# Patient Record
Sex: Male | Born: 1960
Health system: Southern US, Community
[De-identification: ages and names within clinical notes are randomized; demographics above are authoritative.]

## PROBLEM LIST (undated history)

## (undated) DIAGNOSIS — F419 Anxiety disorder, unspecified: Secondary | ICD-10-CM

## (undated) DIAGNOSIS — M1990 Unspecified osteoarthritis, unspecified site: Secondary | ICD-10-CM

## (undated) DIAGNOSIS — K219 Gastro-esophageal reflux disease without esophagitis: Secondary | ICD-10-CM

## (undated) DIAGNOSIS — G479 Sleep disorder, unspecified: Secondary | ICD-10-CM

## (undated) DIAGNOSIS — S46211A Strain of muscle, fascia and tendon of other parts of biceps, right arm, initial encounter: Secondary | ICD-10-CM

## (undated) DIAGNOSIS — I10 Essential (primary) hypertension: Secondary | ICD-10-CM

## (undated) DIAGNOSIS — R739 Hyperglycemia, unspecified: Secondary | ICD-10-CM

## (undated) DIAGNOSIS — T7840XA Allergy, unspecified, initial encounter: Secondary | ICD-10-CM

## (undated) DIAGNOSIS — E785 Hyperlipidemia, unspecified: Secondary | ICD-10-CM

## (undated) DIAGNOSIS — I639 Cerebral infarction, unspecified: Secondary | ICD-10-CM

## (undated) DIAGNOSIS — F32A Depression, unspecified: Secondary | ICD-10-CM

## (undated) DIAGNOSIS — G4700 Insomnia, unspecified: Secondary | ICD-10-CM

## (undated) DIAGNOSIS — F329 Major depressive disorder, single episode, unspecified: Secondary | ICD-10-CM

## (undated) HISTORY — DX: Insomnia, unspecified: G47.00

## (undated) HISTORY — DX: Hyperlipidemia, unspecified: E78.5

## (undated) HISTORY — DX: Essential (primary) hypertension: I10

## (undated) HISTORY — DX: Cerebral infarction, unspecified: I63.9

## (undated) HISTORY — PX: TOTAL HIP ARTHROPLASTY: SHX124

## (undated) HISTORY — DX: Allergy, unspecified, initial encounter: T78.40XA

## (undated) HISTORY — DX: Gastro-esophageal reflux disease without esophagitis: K21.9

## (undated) HISTORY — DX: Hyperglycemia, unspecified: R73.9

## (undated) HISTORY — PX: LUMBAR EPIDURAL INJECTION: SHX1980

## (undated) HISTORY — PX: JOINT REPLACEMENT: SHX530

---

## 1985-12-21 HISTORY — PX: OTHER SURGICAL HISTORY: SHX169

## 2001-05-20 ENCOUNTER — Encounter: Payer: Self-pay | Admitting: Family Medicine

## 2001-05-20 ENCOUNTER — Ambulatory Visit (HOSPITAL_COMMUNITY): Admission: RE | Admit: 2001-05-20 | Discharge: 2001-05-20 | Payer: Self-pay | Admitting: Family Medicine

## 2002-11-07 ENCOUNTER — Other Ambulatory Visit: Admission: RE | Admit: 2002-11-07 | Discharge: 2002-11-07 | Payer: Self-pay | Admitting: Family Medicine

## 2003-09-24 HISTORY — PX: COLONOSCOPY: SHX174

## 2004-12-21 HISTORY — PX: RECTAL SURGERY: SHX760

## 2007-04-22 ENCOUNTER — Ambulatory Visit (HOSPITAL_COMMUNITY): Admission: RE | Admit: 2007-04-22 | Discharge: 2007-04-22 | Payer: Self-pay | Admitting: Surgery

## 2011-01-20 ENCOUNTER — Encounter
Admission: RE | Admit: 2011-01-20 | Discharge: 2011-01-20 | Payer: Self-pay | Source: Home / Self Care | Attending: Physical Medicine and Rehabilitation | Admitting: Physical Medicine and Rehabilitation

## 2011-01-21 ENCOUNTER — Encounter: Payer: Self-pay | Admitting: Physical Medicine and Rehabilitation

## 2011-04-20 ENCOUNTER — Ambulatory Visit (HOSPITAL_COMMUNITY)
Admission: RE | Admit: 2011-04-20 | Discharge: 2011-04-20 | Disposition: A | Discharge status: Home / Self Care | Payer: 59 | Source: Ambulatory Visit | Attending: Orthopedic Surgery | Admitting: Orthopedic Surgery

## 2011-04-20 ENCOUNTER — Other Ambulatory Visit: Payer: Self-pay | Admitting: Orthopedic Surgery

## 2011-04-20 ENCOUNTER — Other Ambulatory Visit (HOSPITAL_COMMUNITY): Payer: Self-pay | Admitting: Orthopedic Surgery

## 2011-04-20 ENCOUNTER — Encounter (HOSPITAL_COMMUNITY): Payer: 59

## 2011-04-20 DIAGNOSIS — Z01810 Encounter for preprocedural cardiovascular examination: Secondary | ICD-10-CM | POA: Insufficient documentation

## 2011-04-20 DIAGNOSIS — I10 Essential (primary) hypertension: Secondary | ICD-10-CM | POA: Insufficient documentation

## 2011-04-20 DIAGNOSIS — Z01812 Encounter for preprocedural laboratory examination: Secondary | ICD-10-CM | POA: Insufficient documentation

## 2011-04-20 DIAGNOSIS — Z01818 Encounter for other preprocedural examination: Secondary | ICD-10-CM | POA: Insufficient documentation

## 2011-04-20 LAB — DIFFERENTIAL
Basophils Absolute: 0 10*3/uL (ref 0.0–0.1)
Eosinophils Relative: 1 % (ref 0–5)
Lymphocytes Relative: 21 % (ref 12–46)
Monocytes Absolute: 0.9 10*3/uL (ref 0.1–1.0)
Monocytes Relative: 12 % (ref 3–12)
Neutro Abs: 4.7 10*3/uL (ref 1.7–7.7)

## 2011-04-20 LAB — URINALYSIS, ROUTINE W REFLEX MICROSCOPIC
Bilirubin Urine: NEGATIVE
Glucose, UA: NEGATIVE mg/dL
Hgb urine dipstick: NEGATIVE
Ketones, ur: NEGATIVE mg/dL
Protein, ur: NEGATIVE mg/dL
Urobilinogen, UA: 0.2 mg/dL (ref 0.0–1.0)

## 2011-04-20 LAB — BASIC METABOLIC PANEL
CO2: 29 mEq/L (ref 19–32)
Calcium: 9.2 mg/dL (ref 8.4–10.5)
GFR calc Af Amer: >60 mL/min (ref >60)
Glucose, Bld: 115 mg/dL — ABNORMAL HIGH (ref 70–99)
Potassium: 4.6 mEq/L (ref 3.5–5.1)
Sodium: 141 mEq/L (ref 135–145)

## 2011-04-20 LAB — CBC
HCT: 43.5 % (ref 39.0–52.0)
Hemoglobin: 14.7 g/dL (ref 13.0–17.0)
MCH: 30.1 pg (ref 26.0–34.0)
MCHC: 33.8 g/dL (ref 30.0–36.0)
MCV: 89 fL (ref 78.0–100.0)
RDW: 12.2 % (ref 11.5–15.5)

## 2011-04-20 LAB — SURGICAL PCR SCREEN: MRSA, PCR: NEGATIVE

## 2011-04-20 LAB — PROTIME-INR: Prothrombin Time: 13.2 seconds (ref 11.6–15.2)

## 2011-04-20 NOTE — H&P (Signed)
Curtis Lucas, Curtis Lucas              ACCOUNT NO.:  [id number]  MEDICAL RECORD NO.:  [id number]           PATIENT TYPE:  I  LOCATION:  1S                           FACILITY:  [facility]  PHYSICIAN:  Madlyn Frankel. Charlann Boxer, M.D.  DATE OF BIRTH:  Jan 14, 1961  DATE OF ADMISSION:  04/13/2011 DATE OF DISCHARGE:                             HISTORY & PHYSICAL   CHIEF COMPLAINT:  Left hip osteoarthritis.  HISTORY OF PRESENT ILLNESS:  This is a 50 year old gentleman with a history of osteoarthritis of his left hip that has failed conservative treatment to alleviate his pain and discomfort.  After discussion of treatments, benefits, risks, and options, the patient is now scheduled for total hip arthroplasty with an anterior approach.  Please note the patient is not a candidate for tranexamic acid and will not receive that in the preoperative holding and he is given his home prescriptions including aspirin 325 mg 1 b.i.d. for DVT prophylaxis.PAST MEDICAL HISTORY:  DRUG ALLERGIES:  None.  CURRENT MEDICATIONS:  Atenolol, amlodipine, Protonix, and a statin, he will bring dosages to the hospital.  PREVIOUS SURGERIES:  Brain surgery for impalement of carotid artery and subsequent coiling of a aneurysm.  MEDICAL ILLNESSES: 1. Hyperlipidemia. 2. Hypertension. 3. Reflux. 4. History of stroke secondary to carotid artery injury.  FAMILY HISTORY:  Positive for colon cancer.  SOCIAL HISTORY:  The patient is married.  He works as a Corporate treasurer. He does not smoke and drinks a 6-pack a day.  REVIEW OF SYSTEMS:  CENTRAL NERVOUS SYSTEM:  Positive for history of stroke in 1987 secondary to carotid artery injury.  PULMONARY:  Negative for shortness of breath, PND, or orthopnea.  CARDIOVASCULAR:  Negative for chest pain and palpitation.  Positive for history of hypertension. GI:  Negative for ulcers and hepatitis.  Positive for reflux.  GU: Negative for urinary tract difficulties.  MUSCULOSKELETAL:  Positive  as in HPI.  PHYSICAL EXAMINATION:  VITAL SIGNS:  BP 130/80, respirations 18, pulse 72 and regular. GENERAL APPERANCE:  This is a well-developed, well-nourished gentleman in no acute distress. HEENT:  Head normocephalic.  Nose patent.  Ears patent.  Pupils equal, round, and reactive to light.  There is slight ptosis of the right eyelid. NECK:  Supple without adenopathy.  Carotids 2+ without bruit. CHEST:  Clear to auscultation.  No rales or rhonchi.  Respirations 18. HEART:  Regular rate and rhythm at 72 beats per minute without murmur. ABDOMEN:  Soft.  No active bowel sounds.  No masses or organomegaly. NEUROLOGIC:  The patient is alert and oriented to time, place, and person.  He does have a history of a CVA secondary to carotid artery injury. EXTREMITIES:  Left hip with decreased range of motion and painful range of motion.  Neurovascular status intact.  IMPRESSION:  Left hip osteoarthritis.  PLAN:  Left total hip arthroplasty by anterior approach.     Jaquelyn Bitter. Chabon, P.A.   ______________________________ Madlyn Frankel Charlann Boxer, M.D.    SJC/MEDQ  D:  04/15/2011  T:  04/16/2011  Job:  [id number]  Electronically Signed by Jodene Nam P.A. on 04/20/2011 09:36:06 AM Electronically Signed by  Durene Romans M.D. on 04/20/2011 12:14:57 PM

## 2011-04-28 ENCOUNTER — Inpatient Hospital Stay (HOSPITAL_COMMUNITY): Payer: 59

## 2011-04-28 ENCOUNTER — Inpatient Hospital Stay (HOSPITAL_COMMUNITY)
Admission: RE | Admit: 2011-04-28 | Discharge: 2011-04-30 | DRG: 470 | Disposition: A | Discharge status: Home Health | Payer: 59 | Source: Ambulatory Visit | Attending: Orthopedic Surgery | Admitting: Orthopedic Surgery

## 2011-04-28 DIAGNOSIS — M169 Osteoarthritis of hip, unspecified: Principal | ICD-10-CM | POA: Diagnosis present

## 2011-04-28 DIAGNOSIS — K219 Gastro-esophageal reflux disease without esophagitis: Secondary | ICD-10-CM | POA: Diagnosis present

## 2011-04-28 DIAGNOSIS — Z8673 Personal history of transient ischemic attack (TIA), and cerebral infarction without residual deficits: Secondary | ICD-10-CM

## 2011-04-28 DIAGNOSIS — I10 Essential (primary) hypertension: Secondary | ICD-10-CM | POA: Diagnosis present

## 2011-04-28 DIAGNOSIS — E785 Hyperlipidemia, unspecified: Secondary | ICD-10-CM | POA: Diagnosis present

## 2011-04-28 DIAGNOSIS — M1610 Unilateral primary osteoarthritis, unspecified hip: Principal | ICD-10-CM | POA: Diagnosis present

## 2011-04-28 LAB — ABO/RH: ABO/RH(D): A POS

## 2011-04-28 LAB — TYPE AND SCREEN
ABO/RH(D): A POS
Antibody Screen: NEGATIVE

## 2011-04-29 LAB — BASIC METABOLIC PANEL
BUN: 9 mg/dL (ref 6–23)
Calcium: 8.9 mg/dL (ref 8.4–10.5)
GFR calc non Af Amer: >60 mL/min (ref >60)
Glucose, Bld: 131 mg/dL — ABNORMAL HIGH (ref 70–99)
Sodium: 137 mEq/L (ref 135–145)

## 2011-04-29 LAB — CBC
HCT: 33.2 % — ABNORMAL LOW (ref 39.0–52.0)
MCHC: 33.7 g/dL (ref 30.0–36.0)
MCV: 89.5 fL (ref 78.0–100.0)
Platelets: 212 10*3/uL (ref 150–400)
RDW: 12.1 % (ref 11.5–15.5)

## 2011-04-30 ENCOUNTER — Emergency Department (HOSPITAL_COMMUNITY)
Admission: EM | Admit: 2011-04-30 | Discharge: 2011-04-30 | Disposition: A | Discharge status: Home / Self Care | Payer: 59 | Attending: Emergency Medicine | Admitting: Emergency Medicine

## 2011-04-30 DIAGNOSIS — Z96649 Presence of unspecified artificial hip joint: Secondary | ICD-10-CM | POA: Insufficient documentation

## 2011-04-30 DIAGNOSIS — E7800 Pure hypercholesterolemia, unspecified: Secondary | ICD-10-CM | POA: Insufficient documentation

## 2011-04-30 DIAGNOSIS — R509 Fever, unspecified: Secondary | ICD-10-CM | POA: Insufficient documentation

## 2011-04-30 DIAGNOSIS — I10 Essential (primary) hypertension: Secondary | ICD-10-CM | POA: Insufficient documentation

## 2011-04-30 LAB — BASIC METABOLIC PANEL
BUN: 11 mg/dL (ref 6–23)
Creatinine, Ser: 0.71 mg/dL (ref 0.4–1.5)
GFR calc Af Amer: >60 mL/min (ref >60)
Glucose, Bld: 103 mg/dL — ABNORMAL HIGH (ref 70–99)
Sodium: 139 mEq/L (ref 135–145)

## 2011-04-30 LAB — URINALYSIS, ROUTINE W REFLEX MICROSCOPIC
Bilirubin Urine: NEGATIVE
Glucose, UA: NEGATIVE mg/dL
Hgb urine dipstick: NEGATIVE
Ketones, ur: NEGATIVE mg/dL
Protein, ur: NEGATIVE mg/dL
pH: 7 (ref 5.0–8.0)

## 2011-04-30 LAB — CBC
MCHC: 33.1 g/dL (ref 30.0–36.0)
RDW: 12.3 % (ref 11.5–15.5)
WBC: 12.3 10*3/uL — ABNORMAL HIGH (ref 4.0–10.5)

## 2011-05-04 NOTE — Op Note (Signed)
Curtis Lucas, Curtis Lucas              ACCOUNT NO.:  [id number]  MEDICAL RECORD NO.:  [id number]           PATIENT TYPE:  I  LOCATION:  1529                         FACILITY:  [facility]  PHYSICIAN:  Madlyn Frankel. Charlann Boxer, M.D.  DATE OF BIRTH:  Jul 26, 1961  DATE OF PROCEDURE:  04/28/2011 DATE OF DISCHARGE:                              OPERATIVE REPORT   PREOPERATIVE DIAGNOSIS:  Left hip osteoarthritis.  POSTOPERATIVE DIAGNOSIS:  Left hip osteoarthritis.  PROCEDURE:  Left total hip replacement utilizing the anterior approach, utilizing DePuy component size 54 Pinnacle cup size 5 standard Tri-Lock stem with a 36 +5 Delta ceramic ball and AltrX neutral liner.  SURGEON:  Madlyn Frankel. Charlann Boxer, M.D.  ASSISTANT:  Jaquelyn Bitter. Chabon, P.A.  ANESTHESIA:  General.  BLOOD LOSS:  600 cc.  DRAINS:  One Hemovac.  COMPLICATIONS:  None.  SPECIMEN:  None.  INDICATIONS FOR THE PROCEDURE:  Mr. Sisney is a 50 year old gentleman who presented to the office for followup evaluation of his hip.  He has had progressive degenerative changes with concerns for an underlying diagnosis for mild hip dysplasia based on his young age for development arthritic changes.  After reviewing risks and benefits and options of approaches of infection, DVT, component failure, dislocation, as well as postop recovery and expectations, consent was obtained for left total hip replacement through anterior approach.  PROCEDURE IN DETAIL:  The patient was brought to operative theater. Once adequate anesthesia and preoperative antibiotics administered, the patient was positioned supine on the [employer] table.  His left hip was predraped and then fluoroscopy used to confirm positioning and landmarks.  The left hip was then prepped and draped in sterile fashion using shower- curtain technique.  Time-out was performed identifying the patient, planned procedure, and extremity.  His anterior superior iliac spine was identified and  incision was started 2 cm lateral and distal to this.  It was extended then over the anterior aspect of the trochanter.  Sharp dissection was carried down to the tensor fascia muscle belly.  This fascia was incised.  The muscle belly elevated off the undersurface of it and then swept laterally.  A retractor was placed along the superior neck.  At this point, cauterized circumflex vessels were removed.  Capsular fat exposed to the anterior aspect of the hip joint. An inferior retractor was placed.  At this point, an L capsulotomy was made along the superior neck to the trochanteric fossa, then extending to the lesser trochanteric region. The stay sutures were placed and retractors placed intracapsular.  At this point, I removed some anterior osteophytes and applied traction on the hip.  The neck osteotomy was delineated under fluoroscopic imaging.  At this point, neck osteotomy made and the femoral head was removed.  At this point, I placed the posterior retractor then placed an anterior retractor.  I was able to remove remaining foveal tissue, noticed significant medial osteophytes, as well as remaining labrum posterior and anterior.  I began reaming with a 48-reamer and reamed up to 53 reamer, the last 2 reamers were used under fluoroscopic imaging to confirm positioning, depth of reaming, as well as orientation.  I chose a 54 Pinnacle cup, this cup was then impacted with a curve impactor. After a couple of adjustments made under fluoroscopic imaging to confirm position in terms of abduction and forward flexion as well as confirm an anatomic boundaries within the hip itself, I then placed a single cancellous screw hole eliminator and the final 36 +4 neutral AltrX liner.  At this point, the hip was rotated to 80 degrees as removed more of the inferior capsule off the neck.  The hip was then rolled back to neutral and a hook applied to the lateral femur for elevation  purposes.  Traction was then placed medially, and the hip was taken down in.  Then after using posterior release exposing the posterior aspect of the femur, the retractors were placed over the greater trochanter.  Once I had the femur exposed, I used a box osteotome.  I then cleared up lateral aspect of the neck to try to prevent any varus position stem.  The starting broach was then impacted and then all retractors were removed and the hip brought back to neutral position and radiographs obtained in neutral and orthogonally 90 degrees confirming the position within the shaft.  Once this was done, all retractors were re-placed.  The leg was taken back down in and I began broaching.  I broached up to a size of 5 broach.  At this point, we had good medial and lateral metaphysis fit. A trial reduction was carried out 36 +4.5 ball.  With this ball in place, under fluoroscopic imaging, I felt there was good fit with the broach in place with a 1.5 ball and the leg seemed to be a little bit short on this left side.  This was kept in mind for final position.  Following the trial reduction, the trial component was removed.  The 5 standard stem was chosen.  The 5 standard stem was then impacted and sat basically where the broach was, and based on this, I went ahead and retrial with 36 +5 ball.  With this, I felt that the lesser trochanters were symmetric through the ischium in relation to one another.  The trial was removed and the final 36 +5 Delta ceramic ball was impacted on the clean and dry trunnion and the hip re-reduced.  The hip had been irrigated throughout the case.  I then reapproximated the anterior capsule to each other using #1 Vicryl.  The remainder wound was closed over Hemovac drain using #1 Vicryl in tensor fascia lata muscle.  The remainder wound was closed with 2-0 Vicryl running 4-0 Monocryl.  The hip was cleaned, dried, and dressed sterilely with some Dermabond and  Aquacel dressing.  The drain site dressed separately.  He was then brought to recovery room in extubated stable condition, tolerating procedure well.     Madlyn Frankel Charlann Boxer, M.D.     MDO/MEDQ  D:  04/28/2011  T:  04/28/2011  Job:  [id number]  Electronically Signed by Durene Romans M.D. on 05/04/2011 03:42:09 PM

## 2011-05-04 NOTE — Discharge Summary (Signed)
  Curtis Lucas, Curtis Lucas              ACCOUNT NO.:  [id number]  MEDICAL RECORD NO.:  [id number]           PATIENT TYPE:  I  LOCATION:  1529                         FACILITY:  [facility]  PHYSICIAN:  Madlyn Frankel. Charlann Boxer, M.D.  DATE OF BIRTH:  January 02, 1961  DATE OF ADMISSION:  04/28/2011 DATE OF DISCHARGE:                              DISCHARGE SUMMARY   ADMITTING DIAGNOSIS:  Left hip osteoarthritis.  DISCHARGE DIAGNOSIS:  Left hip osteoarthritis.  OPERATIONS:  Total hip arthroplasty, anterior approach, left hip.  BRIEF HISTORY:  This is a 50 year old gentleman with a history of osteoarthritis of the left hip that failed conservative management.  At this time, he is admitted to the hospital for anterior total hip arthroplasty of his left hip.  Surgery risks, benefits, and aftercare were discussed with the patient.  Questions invited and answered.  LABORATORY VALUES:  Admission CBC within normal limits.  Admission PT/PTT within normal limits.  Admission BMET showed the glucose slightly high at 115, hemoglobin/hematocrit at discharge were 11.2 and 33.8, white count 12.3 which was down from 13.4.  BMET remained within normal limits with the exception of mildly elevated glucose.  COURSE IN THE HOSPITAL:  The patient tolerated the procedure well.  On first postoperative day, vital signs were stable, he was afebrile. Neurovascular status intact, only moderate drainage from his Hemovac and his Hemovac was discontinued without difficulties.  Calves were negative.  Dressing was dry.  Neurovascular status intact.  After removing the drain, he did have some moderate serosanguineous drainage and some more discomfort than he was having previous to drain removal. Over that night, the pain did decrease and on second postoperative day, his vital signs were stable.  He is afebrile.  Neurovascular status intact.  Calves negative.  His diet was soft showing no evidence of hematoma or compartment syndrome.   Dressing was changed.  Wound was benign.  Calves were negative.  Lungs clear.  Heart sounds normal, and the patient will undergo physical therapy this morning.  If he does well, he will be discharged this afternoon.  Otherwise, we will discharge him in the morning.  CONDITION ON DISCHARGE:  Improved.  DISCHARGE MEDICATIONS: 1. OxyIR 5 mg 1-2 q.4 h. p.r.n. pain. 2. Robaxin 500 p.o. q.6-8 h. p.r.n. spasm. 3. Iron 325 mg p.o. b.i.d. 4. Aspirin 325 mg 1 p.o. b.i.d. for 4 weeks for DVT prophylaxis. 5. MiraLax and Colace p.r.n. constipation.  DISCHARGE INSTRUCTIONS:  He is instructed to keep the wound clean and dry for 4 weeks.  He can weightbear as tolerated using his walker.  He will return to the office in 2 weeks for recheck or see [place] sooner p.r.n. problems.     Jaquelyn Bitter. Chabon, P.A.   ______________________________ Madlyn Frankel Charlann Boxer, M.D.    SJC/MEDQ  D:  04/30/2011  T:  04/30/2011  Job:  [id number]  Electronically Signed by Jodene Nam P.A. on 05/04/2011 11:56:08 AM Electronically Signed by Durene Romans M.D. on 05/04/2011 03:42:18 PM

## 2011-06-29 ENCOUNTER — Encounter: Payer: Self-pay | Admitting: Family Medicine

## 2011-06-29 DIAGNOSIS — R739 Hyperglycemia, unspecified: Secondary | ICD-10-CM | POA: Insufficient documentation

## 2011-06-29 DIAGNOSIS — K219 Gastro-esophageal reflux disease without esophagitis: Secondary | ICD-10-CM | POA: Insufficient documentation

## 2011-06-29 DIAGNOSIS — E785 Hyperlipidemia, unspecified: Secondary | ICD-10-CM | POA: Insufficient documentation

## 2011-06-29 DIAGNOSIS — I10 Essential (primary) hypertension: Secondary | ICD-10-CM | POA: Insufficient documentation

## 2011-06-29 DIAGNOSIS — T7840XA Allergy, unspecified, initial encounter: Secondary | ICD-10-CM | POA: Insufficient documentation

## 2011-06-29 DIAGNOSIS — I639 Cerebral infarction, unspecified: Secondary | ICD-10-CM | POA: Insufficient documentation

## 2012-04-13 NOTE — H&P (Signed)
Curtis Lucas is an 51 y.o. male.    Chief Complaint: right hip OA and pain   HPI: Pt is a 51 y.o. male complaining of right hip pain for 6 months. He originally had a left total hip arthroplasty from an anterior approach about 1 year ago. Right hip pain had continually increased since the beginning and now has started to affect his right and left knees as well as causing some pain in the left hip.  Prior to the right hip pain she wasn't having any other joint pain.   X-rays in the clinic show end-stage arthritic changes of the right hip and a well placed previous left total hip arthroplasty. Pt has tried various conservative treatments which have failed to alleviate their symptoms. Various options are discussed with the patient. Risks, benefits and expectations were discussed with the patient. Patient understand the risks, benefits and expectations and wishes to proceed with surgery.   PCP:  Leo Grosser, MD, MD  D/C Plans:  Home with HHPT  Post-op Meds:  Rx given for ASA, Robaxin, Iron, Colace and MiraLax  Tranexamic Acid:   Not to be given - previous traumatic stroke  Decadron:   To be given  PMH: Past Medical History  Diagnosis Date  . Hypertension   . GERD (gastroesophageal reflux disease)   . CVA (cerebral vascular accident)   . Allergy     Rhinitis  . Elevated lipids   . Hyperglycemia     PSH: Past Surgical History  Procedure Date  . Total hip arthroplasty   . Lumbar epidural injection     Social History:  does not have a smoking history on file. He does not have any smokeless tobacco history on file. His alcohol and drug histories not on file.  Allergies:  Allergies  Allergen Reactions  . Lipitor (Atorvastatin Calcium)     Severe muscle spasms    Medications: No current facility-administered medications for this encounter.   Current Outpatient Prescriptions  Medication Sig Dispense Refill  . amLODipine (NORVASC) 10 MG tablet Take 10 mg by mouth daily.         [name] atenolol (TENORMIN) 100 MG tablet Take 100 mg by mouth daily.        . pantoprazole (PROTONIX) 40 MG tablet Take 40 mg by mouth daily.        . pravastatin (PRAVACHOL) 40 MG tablet Take 40 mg by mouth daily.        [name] zolpidem (AMBIEN CR) 12.5 MG CR tablet Take 12.5 mg by mouth at bedtime as needed.          ROS: Review of Systems  Constitutional: Negative.   HENT: Negative.   Eyes: Positive for double vision.  Respiratory: Negative.   Cardiovascular: Negative.   Gastrointestinal: Negative.   Genitourinary: Negative.   Musculoskeletal: Positive for joint pain.  Skin: Negative.   Neurological: Negative.   Endo/Heme/Allergies: Negative.   Psychiatric/Behavioral: Negative.      Physical Exam: BP: 135/82 ; HR: 80 ; Resp: 16 ; Physical Exam  Constitutional: He is oriented to person, place, and time and well-developed, well-nourished, and in no distress.  HENT:  Head: Normocephalic and atraumatic.  Nose: Nose normal.  Mouth/Throat: Oropharynx is clear and moist.  Eyes: Pupils are equal, round, and reactive to light.  Neck: Neck supple. No JVD present. No tracheal deviation present. No thyromegaly present.  Cardiovascular: Normal rate, regular rhythm, normal heart sounds and intact distal pulses.   Pulmonary/Chest: Effort normal and  breath sounds normal. No stridor. No respiratory distress. He has no wheezes. He exhibits no tenderness.  Abdominal: Soft. There is no tenderness. There is no guarding.  Musculoskeletal:       Right hip: He exhibits decreased range of motion, tenderness, bony tenderness and crepitus. He exhibits normal strength, no swelling, no deformity and no laceration.  Lymphadenopathy:    He has no cervical adenopathy.  Neurological: He is alert and oriented to person, place, and time.  Skin: Skin is warm and dry.  Psychiatric: Affect normal.     Assessment/Plan Assessment: right hip OA and pain   Plan: Patient will undergo a right total hip  arthroplasty, anterior approach on 05/03/2012 per Dr. Charlann Boxer at [facility]. Risks benefits and expectation were discussed with the patient. Patient understand risks, benefits and expectation and wishes to proceed.   Curtis Lucas   PAC  04/13/2012, 4:40 PM

## 2012-04-20 ENCOUNTER — Encounter (HOSPITAL_COMMUNITY): Payer: Self-pay | Admitting: Pharmacy Technician

## 2012-04-26 ENCOUNTER — Encounter (HOSPITAL_COMMUNITY): Payer: Self-pay

## 2012-04-26 ENCOUNTER — Encounter (HOSPITAL_COMMUNITY)
Admission: RE | Admit: 2012-04-26 | Discharge: 2012-04-26 | Disposition: A | Discharge status: Home / Self Care | Payer: 59 | Source: Ambulatory Visit | Attending: Orthopedic Surgery | Admitting: Orthopedic Surgery

## 2012-04-26 ENCOUNTER — Ambulatory Visit (HOSPITAL_COMMUNITY)
Admission: RE | Admit: 2012-04-26 | Discharge: 2012-04-26 | Disposition: A | Discharge status: Home / Self Care | Payer: 59 | Source: Ambulatory Visit | Attending: Orthopedic Surgery | Admitting: Orthopedic Surgery

## 2012-04-26 DIAGNOSIS — Z01812 Encounter for preprocedural laboratory examination: Secondary | ICD-10-CM | POA: Insufficient documentation

## 2012-04-26 DIAGNOSIS — M169 Osteoarthritis of hip, unspecified: Secondary | ICD-10-CM | POA: Insufficient documentation

## 2012-04-26 DIAGNOSIS — Z01818 Encounter for other preprocedural examination: Secondary | ICD-10-CM | POA: Insufficient documentation

## 2012-04-26 DIAGNOSIS — I10 Essential (primary) hypertension: Secondary | ICD-10-CM | POA: Insufficient documentation

## 2012-04-26 DIAGNOSIS — M1610 Unilateral primary osteoarthritis, unspecified hip: Secondary | ICD-10-CM | POA: Insufficient documentation

## 2012-04-26 HISTORY — DX: Unspecified osteoarthritis, unspecified site: M19.90

## 2012-04-26 HISTORY — DX: Sleep disorder, unspecified: G47.9

## 2012-04-26 LAB — DIFFERENTIAL
Basophils Absolute: 0 10*3/uL (ref 0.0–0.1)
Eosinophils Relative: 1 % (ref 0–5)
Lymphocytes Relative: 23 % (ref 12–46)
Monocytes Absolute: 0.7 10*3/uL (ref 0.1–1.0)
Monocytes Relative: 11 % (ref 3–12)
Neutro Abs: 4.4 10*3/uL (ref 1.7–7.7)

## 2012-04-26 LAB — CBC
HCT: 46.5 % (ref 39.0–52.0)
Hemoglobin: 16.2 g/dL (ref 13.0–17.0)
MCV: 90.5 fL (ref 78.0–100.0)
RDW: 12.3 % (ref 11.5–15.5)
WBC: 6.7 10*3/uL (ref 4.0–10.5)

## 2012-04-26 LAB — URINALYSIS, ROUTINE W REFLEX MICROSCOPIC
Glucose, UA: NEGATIVE mg/dL
Hgb urine dipstick: NEGATIVE
Leukocytes, UA: NEGATIVE
Specific Gravity, Urine: 1.019 (ref 1.005–1.030)
pH: 6.5 (ref 5.0–8.0)

## 2012-04-26 LAB — BASIC METABOLIC PANEL
BUN: 14 mg/dL (ref 6–23)
CO2: 27 mEq/L (ref 19–32)
Chloride: 102 mEq/L (ref 96–112)
Creatinine, Ser: 0.83 mg/dL (ref 0.50–1.35)
Potassium: 4.5 mEq/L (ref 3.5–5.1)

## 2012-04-26 LAB — APTT: aPTT: 31 seconds (ref 24–37)

## 2012-04-26 MED ORDER — CHLORHEXIDINE GLUCONATE 4 % EX LIQD
60.0000 mL | Freq: Once | CUTANEOUS | Status: DC
  Filled 2012-04-26: qty 60

## 2012-04-26 NOTE — Patient Instructions (Signed)
20 ROTH RESS  04/26/2012   Your procedure is scheduled on:  05/03/12  Report to SHORT STAY DEPT  at 6:15 AM.  Call this number if you have problems the morning of surgery: [phone]   Remember:   Do not eat food or drink liquids AFTER MIDNIGHT    Take these medicines the morning of surgery with A SIP OF WATER: AMLODIPINE / ATENOLOL / PANTOPROZOLE / OXYCODONE IF NEEED   Do not wear jewelry, make-up or nail polish.  Do not wear lotions, powders, or perfumes.   Do not shave legs or underarms 48 hrs. before surgery (men may shave face)  Do not bring valuables to the hospital.  Contacts, dentures or bridgework may not be worn into surgery.  Leave suitcase in the car. After surgery it may be brought to your room.  For patients admitted to the hospital, checkout time is 11:00 AM the day of discharge.   Patients discharged the day of surgery will not be allowed to drive home.    Special Instructions:   Please read over the following fact sheets that you were given: MRSA  Information               SHOWER WITH BETASEPT THE NIGHT BEFORE SURGERY AND THE MORNING OF SURGERY 20 DONIS PINDER  04/26/2012

## 2012-05-02 NOTE — Pre-Procedure Instructions (Signed)
Left pt message surgery time changed to 0715am, arrive 0515am 04-03-12 [facility] short stay

## 2012-05-02 NOTE — Pre-Procedure Instructions (Signed)
Pt returned call aware surgey time changed to 715 am aware, will arrive 0515 am [facility] short stay

## 2012-05-03 ENCOUNTER — Ambulatory Visit (HOSPITAL_COMMUNITY): Payer: 59

## 2012-05-03 ENCOUNTER — Encounter (HOSPITAL_COMMUNITY)
Admission: RE | Disposition: A | Discharge status: Home Health | Payer: Self-pay | Source: Ambulatory Visit | Attending: Orthopedic Surgery

## 2012-05-03 ENCOUNTER — Encounter (HOSPITAL_COMMUNITY): Payer: Self-pay | Admitting: *Deleted

## 2012-05-03 ENCOUNTER — Encounter (HOSPITAL_COMMUNITY): Payer: Self-pay | Admitting: Orthopedic Surgery

## 2012-05-03 ENCOUNTER — Ambulatory Visit (HOSPITAL_COMMUNITY): Payer: 59 | Admitting: *Deleted

## 2012-05-03 ENCOUNTER — Inpatient Hospital Stay (HOSPITAL_COMMUNITY)
Admission: RE | Admit: 2012-05-03 | Discharge: 2012-05-04 | DRG: 470 | Disposition: A | Discharge status: Home Health | Payer: 59 | Source: Ambulatory Visit | Attending: Orthopedic Surgery | Admitting: Orthopedic Surgery

## 2012-05-03 DIAGNOSIS — M169 Osteoarthritis of hip, unspecified: Principal | ICD-10-CM | POA: Diagnosis present

## 2012-05-03 DIAGNOSIS — E785 Hyperlipidemia, unspecified: Secondary | ICD-10-CM | POA: Diagnosis present

## 2012-05-03 DIAGNOSIS — I10 Essential (primary) hypertension: Secondary | ICD-10-CM | POA: Diagnosis present

## 2012-05-03 DIAGNOSIS — R7309 Other abnormal glucose: Secondary | ICD-10-CM | POA: Diagnosis present

## 2012-05-03 DIAGNOSIS — Z96649 Presence of unspecified artificial hip joint: Secondary | ICD-10-CM

## 2012-05-03 DIAGNOSIS — K219 Gastro-esophageal reflux disease without esophagitis: Secondary | ICD-10-CM | POA: Diagnosis present

## 2012-05-03 DIAGNOSIS — Z8673 Personal history of transient ischemic attack (TIA), and cerebral infarction without residual deficits: Secondary | ICD-10-CM

## 2012-05-03 DIAGNOSIS — M1610 Unilateral primary osteoarthritis, unspecified hip: Principal | ICD-10-CM | POA: Diagnosis present

## 2012-05-03 HISTORY — PX: TOTAL HIP ARTHROPLASTY: SHX124

## 2012-05-03 LAB — TYPE AND SCREEN
ABO/RH(D): A POS
Antibody Screen: NEGATIVE

## 2012-05-03 SURGERY — ARTHROPLASTY, HIP, TOTAL, ANTERIOR APPROACH
Anesthesia: General | Site: Hip | Laterality: Right | Wound class: Clean

## 2012-05-03 MED ORDER — MENTHOL 3 MG MT LOZG
1.0000 | LOZENGE | OROMUCOSAL | Status: DC | PRN
  Filled 2012-05-03: qty 9

## 2012-05-03 MED ORDER — DIPHENHYDRAMINE HCL 12.5 MG/5ML PO ELIX: 25.0000 mg | ORAL_SOLUTION | Freq: Four times a day (QID) | ORAL | Status: DC | PRN

## 2012-05-03 MED ORDER — SENNA 8.6 MG PO TABS
1.0000 | ORAL_TABLET | Freq: Two times a day (BID) | ORAL | Status: DC
  Administered 2012-05-03 – 2012-05-04 (×3): 8.6 mg via ORAL
  Filled 2012-05-03 (×3): qty 1

## 2012-05-03 MED ORDER — SODIUM CHLORIDE 0.9 % IV SOLN
INTRAVENOUS | Status: DC
  Administered 2012-05-03 (×2): via INTRAVENOUS
  Filled 2012-05-03 (×4): qty 1000

## 2012-05-03 MED ORDER — DEXAMETHASONE SODIUM PHOSPHATE 10 MG/ML IJ SOLN: 10.0000 mg | Freq: Once | INTRAMUSCULAR | Status: DC

## 2012-05-03 MED ORDER — ACETAMINOPHEN 10 MG/ML IV SOLN
INTRAVENOUS | Status: AC
  Filled 2012-05-03: qty 100

## 2012-05-03 MED ORDER — LACTATED RINGERS IV SOLN: INTRAVENOUS | Status: DC

## 2012-05-03 MED ORDER — MIDAZOLAM HCL 5 MG/5ML IJ SOLN
INTRAMUSCULAR | Status: DC | PRN
  Administered 2012-05-03: 2 mg via INTRAVENOUS

## 2012-05-03 MED ORDER — POLYETHYLENE GLYCOL 3350 17 G PO PACK: 17.0000 g | PACK | Freq: Every day | ORAL | Status: DC | PRN

## 2012-05-03 MED ORDER — ONDANSETRON HCL 4 MG PO TABS: 4.0000 mg | ORAL_TABLET | Freq: Four times a day (QID) | ORAL | Status: DC | PRN

## 2012-05-03 MED ORDER — CEFAZOLIN SODIUM-DEXTROSE 2-3 GM-% IV SOLR
2.0000 g | Freq: Once | INTRAVENOUS | Status: AC
  Administered 2012-05-03: 2 g via INTRAVENOUS

## 2012-05-03 MED ORDER — FENTANYL CITRATE 0.05 MG/ML IJ SOLN
25.0000 ug | INTRAMUSCULAR | Status: AC | PRN
  Administered 2012-05-03 (×6): 25 ug via INTRAVENOUS

## 2012-05-03 MED ORDER — ZOLPIDEM TARTRATE 5 MG PO TABS
5.0000 mg | ORAL_TABLET | Freq: Every evening | ORAL | Status: DC | PRN
  Administered 2012-05-03: 5 mg via ORAL
  Filled 2012-05-03: qty 1

## 2012-05-03 MED ORDER — RIVAROXABAN 10 MG PO TABS
10.0000 mg | ORAL_TABLET | Freq: Every day | ORAL | Status: DC
  Administered 2012-05-04: 10 mg via ORAL
  Filled 2012-05-03 (×2): qty 1

## 2012-05-03 MED ORDER — FENTANYL CITRATE 0.05 MG/ML IJ SOLN
INTRAMUSCULAR | Status: DC | PRN
  Administered 2012-05-03: 100 ug via INTRAVENOUS
  Administered 2012-05-03 (×3): 50 ug via INTRAVENOUS
  Administered 2012-05-03: 100 ug via INTRAVENOUS

## 2012-05-03 MED ORDER — ONDANSETRON HCL 4 MG/2ML IJ SOLN
INTRAMUSCULAR | Status: DC | PRN
  Administered 2012-05-03: 4 mg via INTRAVENOUS

## 2012-05-03 MED ORDER — DOCUSATE SODIUM 100 MG PO CAPS
100.0000 mg | ORAL_CAPSULE | Freq: Two times a day (BID) | ORAL | Status: DC
  Administered 2012-05-03 – 2012-05-04 (×3): 100 mg via ORAL

## 2012-05-03 MED ORDER — CEFAZOLIN SODIUM-DEXTROSE 2-3 GM-% IV SOLR
INTRAVENOUS | Status: AC
  Filled 2012-05-03: qty 50

## 2012-05-03 MED ORDER — PROPOFOL 10 MG/ML IV EMUL
INTRAVENOUS | Status: DC | PRN
  Administered 2012-05-03: 200 mg via INTRAVENOUS

## 2012-05-03 MED ORDER — HYDROMORPHONE HCL PF 1 MG/ML IJ SOLN
0.2000 mg | INTRAMUSCULAR | Status: DC | PRN
  Administered 2012-05-03 (×2): 0.5 mg via INTRAVENOUS
  Filled 2012-05-03 (×3): qty 1

## 2012-05-03 MED ORDER — AMLODIPINE BESYLATE 10 MG PO TABS
10.0000 mg | ORAL_TABLET | Freq: Every day | ORAL | Status: DC
  Administered 2012-05-04: 10 mg via ORAL
  Filled 2012-05-03: qty 1

## 2012-05-03 MED ORDER — LACTATED RINGERS IV SOLN
INTRAVENOUS | Status: DC | PRN
  Administered 2012-05-03 (×2): via INTRAVENOUS

## 2012-05-03 MED ORDER — NEOSTIGMINE METHYLSULFATE 1 MG/ML IJ SOLN
INTRAMUSCULAR | Status: DC | PRN
  Administered 2012-05-03: 5 mg via INTRAVENOUS

## 2012-05-03 MED ORDER — FENTANYL CITRATE 0.05 MG/ML IJ SOLN
INTRAMUSCULAR | Status: AC
  Filled 2012-05-03: qty 2

## 2012-05-03 MED ORDER — ONDANSETRON HCL 4 MG/2ML IJ SOLN: 4.0000 mg | Freq: Four times a day (QID) | INTRAMUSCULAR | Status: DC | PRN

## 2012-05-03 MED ORDER — PROMETHAZINE HCL 25 MG/ML IJ SOLN: 6.2500 mg | INTRAMUSCULAR | Status: DC | PRN

## 2012-05-03 MED ORDER — PHENOL 1.4 % MT LIQD
1.0000 | OROMUCOSAL | Status: DC | PRN
  Filled 2012-05-03: qty 177

## 2012-05-03 MED ORDER — OXYCODONE HCL 5 MG PO TABS
5.0000 mg | ORAL_TABLET | ORAL | Status: DC | PRN
  Administered 2012-05-03 – 2012-05-04 (×4): 10 mg via ORAL
  Filled 2012-05-03 (×4): qty 2

## 2012-05-03 MED ORDER — ATENOLOL 100 MG PO TABS
100.0000 mg | ORAL_TABLET | Freq: Every day | ORAL | Status: DC
  Administered 2012-05-04: 100 mg via ORAL
  Filled 2012-05-03: qty 1

## 2012-05-03 MED ORDER — PRAVASTATIN SODIUM 40 MG PO TABS
80.0000 mg | ORAL_TABLET | Freq: Every day | ORAL | Status: DC
  Administered 2012-05-03: 80 mg via ORAL
  Filled 2012-05-03 (×2): qty 2

## 2012-05-03 MED ORDER — DEXAMETHASONE SODIUM PHOSPHATE 10 MG/ML IJ SOLN
INTRAMUSCULAR | Status: DC | PRN
  Administered 2012-05-03: 10 mg via INTRAVENOUS

## 2012-05-03 MED ORDER — ATENOLOL 100 MG PO TABS
100.0000 mg | ORAL_TABLET | Freq: Every day | ORAL | Status: DC
  Filled 2012-05-03: qty 1

## 2012-05-03 MED ORDER — HYDROMORPHONE HCL PF 1 MG/ML IJ SOLN
INTRAMUSCULAR | Status: DC | PRN
  Administered 2012-05-03 (×2): 1 mg via INTRAVENOUS

## 2012-05-03 MED ORDER — ACETAMINOPHEN 10 MG/ML IV SOLN
INTRAVENOUS | Status: DC | PRN
  Administered 2012-05-03: 1000 mg via INTRAVENOUS

## 2012-05-03 MED ORDER — LIDOCAINE HCL (CARDIAC) 20 MG/ML IV SOLN
INTRAVENOUS | Status: DC | PRN
  Administered 2012-05-03: 100 mg via INTRAVENOUS

## 2012-05-03 MED ORDER — 0.9 % SODIUM CHLORIDE (POUR BTL) OPTIME
TOPICAL | Status: DC | PRN
  Administered 2012-05-03: 1000 mL

## 2012-05-03 MED ORDER — PANTOPRAZOLE SODIUM 40 MG PO TBEC
40.0000 mg | DELAYED_RELEASE_TABLET | Freq: Every day | ORAL | Status: DC
  Filled 2012-05-03: qty 1

## 2012-05-03 MED ORDER — PANTOPRAZOLE SODIUM 40 MG PO TBEC
40.0000 mg | DELAYED_RELEASE_TABLET | Freq: Every day | ORAL | Status: DC
  Administered 2012-05-04: 40 mg via ORAL
  Filled 2012-05-03: qty 1

## 2012-05-03 MED ORDER — AMLODIPINE BESYLATE 10 MG PO TABS
10.0000 mg | ORAL_TABLET | Freq: Every day | ORAL | Status: DC
Start: 2012-05-03 — End: 2012-05-03
  Filled 2012-05-03: qty 1

## 2012-05-03 MED ORDER — ALUM & MAG HYDROXIDE-SIMETH 200-200-20 MG/5ML PO SUSP: 30.0000 mL | ORAL | Status: DC | PRN

## 2012-05-03 MED ORDER — CEFAZOLIN SODIUM 1-5 GM-% IV SOLN
1.0000 g | Freq: Four times a day (QID) | INTRAVENOUS | Status: AC
  Administered 2012-05-03 – 2012-05-04 (×3): 1 g via INTRAVENOUS
  Filled 2012-05-03 (×3): qty 50

## 2012-05-03 MED ORDER — NON FORMULARY: 80.0000 mg | Freq: Every day | Status: DC

## 2012-05-03 MED ORDER — FERROUS SULFATE 325 (65 FE) MG PO TABS
325.0000 mg | ORAL_TABLET | Freq: Three times a day (TID) | ORAL | Status: DC
  Administered 2012-05-03 – 2012-05-04 (×3): 325 mg via ORAL
  Filled 2012-05-03 (×6): qty 1

## 2012-05-03 MED ORDER — GLYCOPYRROLATE 0.2 MG/ML IJ SOLN
INTRAMUSCULAR | Status: DC | PRN
  Administered 2012-05-03: .8 mg via INTRAVENOUS

## 2012-05-03 MED ORDER — METHOCARBAMOL 500 MG PO TABS
500.0000 mg | ORAL_TABLET | Freq: Four times a day (QID) | ORAL | Status: DC | PRN
  Administered 2012-05-03 – 2012-05-04 (×3): 500 mg via ORAL
  Filled 2012-05-03 (×3): qty 1

## 2012-05-03 MED ORDER — HETASTARCH-ELECTROLYTES 6 % IV SOLN
INTRAVENOUS | Status: DC | PRN
  Administered 2012-05-03: 08:00:00 via INTRAVENOUS

## 2012-05-03 MED ORDER — ROCURONIUM BROMIDE 100 MG/10ML IV SOLN
INTRAVENOUS | Status: DC | PRN
  Administered 2012-05-03: 50 mg via INTRAVENOUS
  Administered 2012-05-03: 20 mg via INTRAVENOUS

## 2012-05-03 SURGICAL SUPPLY — 38 items
BAG ZIPLOCK 12X15 (MISCELLANEOUS) ×4 IMPLANT
BLADE SAW SGTL 18X1.27X75 (BLADE) ×2 IMPLANT
CELLS DAT CNTRL 66122 CELL SVR (MISCELLANEOUS) ×1 IMPLANT
CLOTH BEACON ORANGE TIMEOUT ST (SAFETY) ×2 IMPLANT
DERMABOND ADVANCED (GAUZE/BANDAGES/DRESSINGS) ×1
DERMABOND ADVANCED .7 DNX12 (GAUZE/BANDAGES/DRESSINGS) ×1 IMPLANT
DRAPE C-ARM 42X72 X-RAY (DRAPES) ×2 IMPLANT
DRAPE STERI IOBAN 125X83 (DRAPES) ×2 IMPLANT
DRAPE U-SHAPE 47X51 STRL (DRAPES) ×6 IMPLANT
DRSG AQUACEL AG ADV 3.5X10 (GAUZE/BANDAGES/DRESSINGS) ×2 IMPLANT
DRSG TEGADERM 4X4.75 (GAUZE/BANDAGES/DRESSINGS) IMPLANT
DURAPREP 26ML APPLICATOR (WOUND CARE) ×2 IMPLANT
ELECT BLADE TIP CTD 4 INCH (ELECTRODE) ×2 IMPLANT
ELECT REM PT RETURN 9FT ADLT (ELECTROSURGICAL) ×2
ELECTRODE REM PT RTRN 9FT ADLT (ELECTROSURGICAL) ×1 IMPLANT
EVACUATOR 1/8 PVC DRAIN (DRAIN) IMPLANT
FACESHIELD LNG OPTICON STERILE (SAFETY) ×8 IMPLANT
GAUZE SPONGE 2X2 8PLY STRL LF (GAUZE/BANDAGES/DRESSINGS) ×1 IMPLANT
GLOVE BIOGEL PI IND STRL 7.5 (GLOVE) ×1 IMPLANT
GLOVE BIOGEL PI IND STRL 8 (GLOVE) ×1 IMPLANT
GLOVE BIOGEL PI INDICATOR 7.5 (GLOVE) ×1
GLOVE BIOGEL PI INDICATOR 8 (GLOVE) ×1
GLOVE ECLIPSE 8.0 STRL XLNG CF (GLOVE) ×2 IMPLANT
GLOVE ORTHO TXT STRL SZ7.5 (GLOVE) ×4 IMPLANT
GOWN BRE IMP PREV XXLGXLNG (GOWN DISPOSABLE) ×4 IMPLANT
GOWN STRL NON-REIN LRG LVL3 (GOWN DISPOSABLE) ×2 IMPLANT
KIT BASIN OR (CUSTOM PROCEDURE TRAY) ×2 IMPLANT
PACK TOTAL JOINT (CUSTOM PROCEDURE TRAY) ×2 IMPLANT
PADDING CAST COTTON 6X4 STRL (CAST SUPPLIES) ×2 IMPLANT
RTRCTR WOUND ALEXIS 18CM MED (MISCELLANEOUS) ×2
SPONGE GAUZE 2X2 STER 10/PKG (GAUZE/BANDAGES/DRESSINGS) ×1
SUCTION FRAZIER 12FR DISP (SUCTIONS) ×2 IMPLANT
SUT MNCRL AB 4-0 PS2 18 (SUTURE) ×2 IMPLANT
SUT VIC AB 1 CT1 36 (SUTURE) ×8 IMPLANT
SUT VIC AB 2-0 CT1 27 (SUTURE) ×2
SUT VIC AB 2-0 CT1 TAPERPNT 27 (SUTURE) ×2 IMPLANT
TOWEL OR 17X26 10 PK STRL BLUE (TOWEL DISPOSABLE) ×4 IMPLANT
TRAY FOLEY CATH 14FRSI W/METER (CATHETERS) ×2 IMPLANT

## 2012-05-03 NOTE — Preoperative (Signed)
Beta Blockers   Reason not to administer Beta Blockers:pt took atenolol this am 

## 2012-05-03 NOTE — Evaluation (Signed)
Physical Therapy Evaluation Patient Details Name: Curtis Lucas MRN: [id number] DOB: 1961-04-24 Today's Date: 05/03/2012 Time: 4098-1191 PT Time Calculation (min): 21 min  PT Assessment / Plan / Recommendation Clinical Impression  Pt s/p Direct Anterior THR.  Pt would benefit from acute PT services in order to improve ambulation and stairs to prepare for d/c home with spouse.  Pt with possible d/c tomorrow.  Will need to practice stairs prior to d/c.    PT Assessment  Patient needs continued PT services    Follow Up Recommendations  Home health PT    Barriers to Discharge        lEquipment Recommendations  None recommended by PT    Recommendations for Other Services     Frequency 7X/week    Precautions / Restrictions Precautions Precautions: None Precaution Comments: Direct Anterior approach Restrictions RLE Weight Bearing: Weight bearing as tolerated   Pertinent Vitals/Pain       Mobility  Bed Mobility Bed Mobility: Supine to Sit;Sit to Supine Supine to Sit: 5: Supervision Sit to Supine: 5: Supervision Details for Bed Mobility Assistance: verbal cues for technique, used L LE to assist R LE Transfers Transfers: Sit to Stand;Stand to Sit Sit to Stand: 4: Min guard;From bed Stand to Sit: 4: Min guard;To bed Details for Transfer Assistance: verbal cues for safe technique Ambulation/Gait Ambulation/Gait Assistance: 4: Min guard Ambulation Distance (Feet): 120 Feet Assistive device: Rolling walker Ambulation/Gait Assistance Details: increased use of upper body Gait Pattern: Step-through pattern;Decreased stride length;Decreased stance time - right Gait velocity: decreased    Exercises Total Joint Exercises Ankle Circles/Pumps: AROM;Both;20 reps;Supine Quad Sets: AROM;Strengthening;20 reps;Supine;Both Short Arc Quad: AROM;Right;20 reps;Supine;Strengthening Heel Slides: AROM;Strengthening;Right;10 reps;Supine Hip ABduction/ADduction: AROM;Strengthening;Right;10  reps;Supine   PT Diagnosis: Difficulty walking;Acute pain  PT Problem List: Decreased strength;Decreased activity tolerance;Decreased mobility;Pain PT Treatment Interventions: DME instruction;Gait training;Stair training;Therapeutic activities;Functional mobility training;Therapeutic exercise;Patient/family education   PT Goals Acute Rehab PT Goals PT Goal Formulation: With patient Time For Goal Achievement: 05/10/12 Potential to Achieve Goals: Good Pt will go Sit to Stand: with modified independence PT Goal: Sit to Stand - Progress: Goal set today Pt will go Stand to Sit: with modified independence PT Goal: Stand to Sit - Progress: Goal set today Pt will Ambulate: 51 - 150 feet;with modified independence;with least restrictive assistive device PT Goal: Ambulate - Progress: Goal set today Pt will Go Up / Down Stairs: 3-5 stairs;with supervision;with least restrictive assistive device PT Goal: Up/Down Stairs - Progress: Goal set today  Visit Information  Last PT Received On: 05/03/12 Assistance Needed: +1    Subjective Data  Subjective: "I'm just worried about when they pull that drain."   Prior Functioning  Home Living Lives With: Spouse Type of Home: House Home Access: Stairs to enter [employer] of Steps: 5 Entrance Stairs-Rails: None Home Layout: One level Home Adaptive Equipment: Walker - rolling;Bedside commode/3-in-1 Prior Function Level of Independence: Independent Communication Communication: No difficulties    Cognition  Overall Cognitive Status: Appears within functional limits for tasks assessed/performed Arousal/Alertness: Awake/alert Orientation Level: Appears intact for tasks assessed Behavior During Session: [facility] for tasks performed    Extremity/Trunk Assessment Right Upper Extremity Assessment RUE ROM/Strength/Tone: [facility] for tasks assessed Left Upper Extremity Assessment LUE ROM/Strength/Tone: WFL for tasks assessed Right Lower Extremity  Assessment RLE ROM/Strength/Tone: Deficits RLE ROM/Strength/Tone Deficits: good quad contraction however 2+/5 hip strength RLE Sensation: WFL - Light Touch Left Lower Extremity Assessment LLE ROM/Strength/Tone: WFL for tasks assessed   Balance    End  of Session PT - End of Session Equipment Utilized During Treatment: Gait belt Activity Tolerance: Patient tolerated treatment well Patient left: in bed;with call bell/phone within reach   [facility] E 05/03/2012, 4:00 PM Pager: [phone]

## 2012-05-03 NOTE — Anesthesia Postprocedure Evaluation (Signed)
Anesthesia Post Note  Patient: Curtis Lucas  Procedure(s) Performed: Procedure(s) (LRB): TOTAL HIP ARTHROPLASTY ANTERIOR APPROACH (Right)  Anesthesia type: General  Patient location: PACU  Post pain: Pain level controlled  Post assessment: Post-op Vital signs reviewed  Last Vitals:  Filed Vitals:   05/03/12 1130  BP: 109/87  Pulse: 64  Temp: 36.3 C  Resp: 11    Post vital signs: Reviewed  Level of consciousness: sedated  Complications: No apparent anesthesia complications

## 2012-05-03 NOTE — Interval H&P Note (Signed)
History and Physical Interval Note:  05/03/2012 6:49 AM  Curtis Lucas  has presented today for surgery, with the diagnosis of osteoarthritis right hip   The various methods of treatment have been discussed with the patient and family. After consideration of risks, benefits and other options for treatment, the patient has consented to  Procedure(s) (LRB):RIGHT TOTAL HIP ARTHROPLASTY ANTERIOR APPROACH (Right) as a surgical intervention .  The patients' history has been reviewed, patient examined, no change in status, stable for surgery.  I have reviewed the patients' chart and labs.  Questions were answered to the patient's satisfaction.     Shelda Pal

## 2012-05-03 NOTE — Anesthesia Preprocedure Evaluation (Signed)
Anesthesia Evaluation  Patient identified by MRN, date of birth, ID band Patient awake    Reviewed: Allergy & Precautions, H&P , NPO status , Patient's Chart, lab work & pertinent test results  History of Anesthesia Complications Negative for: history of anesthetic complications  Airway Mallampati: III TM Distance: >3 FB Neck ROM: Full    Dental  (+) Teeth Intact and Dental Advisory Given,    Pulmonary neg pulmonary ROS,  breath sounds clear to auscultation        Cardiovascular hypertension, Pt. on medications Rhythm:Regular Rate:Normal     Neuro/Psych CVA, Residual Symptoms negative psych ROS   GI/Hepatic Neg liver ROS, GERD-  Medicated,  Endo/Other  negative endocrine ROS  Renal/GU negative Renal ROS  negative genitourinary   Musculoskeletal negative musculoskeletal ROS (+)   Abdominal   Peds negative pediatric ROS (+)  Hematology negative hematology ROS (+)   Anesthesia Other Findings   Reproductive/Obstetrics negative OB ROS                           Anesthesia Physical Anesthesia Plan  ASA: III  Anesthesia Plan: General   Post-op Pain Management:    Induction: Intravenous  Airway Management Planned: Oral ETT  Additional Equipment:   Intra-op Plan:   Post-operative Plan: Extubation in OR  Informed Consent: I have reviewed the patients History and Physical, chart, labs and discussed the procedure including the risks, benefits and alternatives for the proposed anesthesia with the patient or authorized representative who has indicated his/her understanding and acceptance.   Dental advisory given  Plan Discussed with: CRNA  Anesthesia Plan Comments:         Anesthesia Quick Evaluation

## 2012-05-03 NOTE — Op Note (Signed)
NAME:  Curtis Lucas                ACCOUNT NO.: [id number]      MEDICAL RECORD NO.: [id number]      FACILITY:  [facility]      PHYSICIAN:  Durene Romans D  DATE OF BIRTH:  05-27-1961     DATE OF PROCEDURE:  05/03/2012                                 OPERATIVE REPORT         PREOPERATIVE DIAGNOSIS: Right  hip osteoarthritis.      POSTOPERATIVE DIAGNOSIS:  Right hip osteoarthritis.      PROCEDURE:  Right total hip replacement through an anterior approach   utilizing DePuy THR system, component size 56mm pinnacle cup, a size 36+4 neutral   Altrex liner, a size 6 Hi Tri Lock stem with a 36+5 delta ceramic   ball.      SURGEON:  Curtis Lucas, M.D.      ASSISTANT:  Leilani Able, PA      ANESTHESIA:  General.      SPECIMENS:  None.      COMPLICATIONS:  None.      BLOOD LOSS:  325 cc     DRAINS:  One Hemovac.      INDICATION OF THE PROCEDURE:  Curtis Lucas is a 51 y.o. male who had   presented to office for evaluation of right hip pain.  Radiographs revealed   progressive degenerative changes with bone-on-bone   articulation to the  hip joint.  The patient had painful limited range of   motion significantly affecting their overall quality of life.  The patient was failing to    respond to conservative measures, and at this point was ready   to proceed with more definitive measures.  The patient has noted progressive   degenerative changes in his hip, progressive problems and dysfunction   with regarding the hip prior to surgery.  Consent was obtained for   benefit of pain relief.  Specific risk of infection, DVT, component   failure, dislocation, need for revision surgery, as well discussion of   the anterior versus posterior approach were reviewed.  Consent was   obtained for benefit of anterior pain relief through an anterior   approach.      PROCEDURE IN DETAIL:  The patient was brought to operative theater.   Once adequate anesthesia, preoperative antibiotics, 2gm  Ancef administered.   The patient was positioned supine on the OSI Hanna table.  Once adequate   padding of boney process was carried out, we had predraped out the hip, and  used fluoroscopy to confirm orientation of the pelvis and position.      The right hip was then prepped and draped from proximal iliac crest to   mid thigh with shower curtain technique.      Time-out was performed identifying the patient, planned procedure, and   extremity.     An incision was then made 2 cm distal and lateral to the   anterior superior iliac spine extending over the orientation of the   tensor fascia lata muscle and sharp dissection was carried down to the   fascia of the muscle and protractor placed in the soft tissues.      The fascia was then incised.  The muscle belly was identified and swept  laterally and retractor placed along the superior neck.  Following   cauterization of the circumflex vessels and removing some pericapsular   fat, a second cobra retractor was placed on the inferior neck.  A third   retractor was placed on the anterior acetabulum after elevating the   anterior rectus.  A L-capsulotomy was along the line of the   superior neck to the trochanteric fossa, then extended proximally and   distally.  Tag sutures were placed and the retractors were then placed   intracapsular.  We then identified the trochanteric fossa and   orientation of my neck cut, confirmed this radiographically   and then made a neck osteotomy with the femur on traction.  The femoral   head was removed without difficulty or complication.  Traction was let   off and retractors were placed posterior and anterior around the   acetabulum.      The labrum and foveal tissue were debrided.  I began reaming with a 50mm   reamer and reamed up to 55mm reamer with good bony bed preparation and a 56   cup was chosen.  The final 56mm Pinnacle cup was then impacted under fluoroscopy  to confirm the depth of  penetration and orientation with respect to   abduction.  A screw was placed followed by the hole eliminator.  The final   36+4 neutral Altrex liner was impacted with good visualized rim fit.  The cup was positioned anatomically within the acetabular portion of the pelvis.      At this point, the femur was rolled at 80 degrees.  Further capsule was   released off the inferior aspect of the femoral neck.  I then   released the superior capsule proximally.  The hook was placed laterally   along the femur and elevated manually and held in position with the bed   hook.  The leg was then extended and adducted with the leg rolled to 100   degrees of external rotation.  Once the proximal femur was fully   exposed, I used a box osteotome to set orientation.  I then began   broaching with the starting chili pepper broach and passed this by hand and then broached up to 6.  With the 6 broach in place I chose a high offset neck and did a trial reduction with a 36+5 ball to nmatch the other hip done last year.  The offset was appropriate, leg lengths   appeared to be equal, confirmed radiographically.   Given these findings, I went ahead and dislocated the hip, repositioned all   retractors and positioned the right hip in the extended and abducted position.  The final 6 high offset Tri Lock stem was   chosen and it was impacted down to the level of neck cut.  Based on this   and the trial reduction, a 36+5 delta ceramic ball was chosen and   impacted onto a clean and dry trunnion, and the hip was reduced.  The   hip had been irrigated throughout the case again at this point.  I did   reapproximate the superior capsular leaflet to the anterior leaflet   using #1 Vicryl, placed a medium Hemovac drain deep.  The fascia of the   tensor fascia lata muscle was then reapproximated using #1 Vicryl.  The   remaining wound was closed with 2-0 Vicryl and running 4-0 Monocryl.   The hip was cleaned, dried, and  dressed sterilely using Dermabond  and   Aquacel dressing.  Drain site dressed separately.  She was then brought   to recovery room in stable condition tolerating the procedure well.    Molli Barrows, PA-C was present for the entirety of the case involved from   preoperative positioning, perioperative retractor management, general   facilitation of the case, as well as primary wound closure as assistant.            Pietro Cassis Alvan Dame, M.D.            MDO/MEDQ  D:  10/13/2011  T:  10/13/2011  Job:  [id number]      Electronically Signed by Paralee Cancel M.D. on 10/19/2011 09:15:38 AM

## 2012-05-03 NOTE — Care Management Note (Unsigned)
    Page 1 of 2   05/03/2012     7:00:35 PM   CARE MANAGEMENT NOTE 05/03/2012  Patient:  Curtis Lucas, Curtis Lucas   Account Number:  [id number]  Date Initiated:  05/03/2012  Documentation initiated by:  Colleen Can  Subjective/Objective Assessment:   dx anterior hip replacemnt-right     Action/Plan:   Cm spoke with patient. Plans are for patient to return to [facility] and spouse will be caregiver. He states he already has RW and does not need commode seat. he is requesting [facility] when offerd hh choice   Anticipated DC Date:  05/04/2012   Anticipated DC Plan:  HOME W HOME HEALTH SERVICES  In-house referral  NA      DC Planning Services  CM consult      [facility] Choice  HOME HEALTH   Choice offered to / List presented to:  C-1 Patient   DME arranged  NA      DME agency  NA     HH arranged  HH-2 PT      [facility] agency  [facility] Care   Status of service:  Completed, signed off Medicare Important Message given?  NO (If response is "NO", the following Medicare IM given date fields will be blank) Date Medicare IM given:   Date Additional Medicare IM given:    Discharge Disposition:    Per UR Regulation:    If discussed at Long Length of Stay Meetings, dates discussed:    Comments:  05/03/2012 [name] BSN CCM [phone] Pt states he has used [facility] in the past and is requesting same therapist-Gracie. [facility] notified and can service patient with [facility] pt with start date of 05/05/2012. List of HH agencies placed in chart. [facility] faxed HHPT orders, op note, H&P. face sheet to intake-Sally-[phone]-confirmation received,

## 2012-05-03 NOTE — Transfer of Care (Signed)
Immediate Anesthesia Transfer of Care Note  Patient: Curtis Lucas  Procedure(s) Performed: Procedure(s) (LRB): TOTAL HIP ARTHROPLASTY ANTERIOR APPROACH (Right)  Patient Location: PACU  Anesthesia Type: General  Level of Consciousness: awake, alert  and oriented  Airway & Oxygen Therapy: Patient Spontanous Breathing and Patient connected to face mask oxygen  Post-op Assessment: Report given to PACU RN and Post -op Vital signs reviewed and stable  Post vital signs: Reviewed and stable  Complications: No apparent anesthesia complications

## 2012-05-04 ENCOUNTER — Encounter (HOSPITAL_COMMUNITY): Payer: Self-pay | Admitting: Orthopedic Surgery

## 2012-05-04 LAB — CBC
HCT: 36.5 % — ABNORMAL LOW (ref 39.0–52.0)
Hemoglobin: 12.4 g/dL — ABNORMAL LOW (ref 13.0–17.0)
WBC: 14.8 10*3/uL — ABNORMAL HIGH (ref 4.0–10.5)

## 2012-05-04 LAB — BASIC METABOLIC PANEL
BUN: 8 mg/dL (ref 6–23)
CO2: 27 mEq/L (ref 19–32)
Chloride: 103 mEq/L (ref 96–112)
Glucose, Bld: 157 mg/dL — ABNORMAL HIGH (ref 70–99)
Potassium: 4 mEq/L (ref 3.5–5.1)

## 2012-05-04 MED ORDER — METHOCARBAMOL 500 MG PO TABS: 500.0000 mg | ORAL_TABLET | Freq: Four times a day (QID) | ORAL | Status: AC | PRN

## 2012-05-04 MED ORDER — DSS 100 MG PO CAPS: 100.0000 mg | ORAL_CAPSULE | Freq: Two times a day (BID) | ORAL | Status: AC

## 2012-05-04 MED ORDER — OXYCODONE HCL 5 MG PO TABS: 5.0000 mg | ORAL_TABLET | ORAL | Status: AC | PRN

## 2012-05-04 MED ORDER — ASPIRIN EC 325 MG PO TBEC: 325.0000 mg | DELAYED_RELEASE_TABLET | Freq: Two times a day (BID) | ORAL | Status: AC

## 2012-05-04 MED ORDER — POLYETHYLENE GLYCOL 3350 17 G PO PACK: 17.0000 g | PACK | Freq: Every day | ORAL | Status: AC | PRN

## 2012-05-04 MED ORDER — FERROUS SULFATE 325 (65 FE) MG PO TABS: 325.0000 mg | ORAL_TABLET | Freq: Three times a day (TID) | ORAL | Status: DC

## 2012-05-04 NOTE — Progress Notes (Signed)
Subjective: 1 Day Post-Op Procedure(s) (LRB): TOTAL HIP ARTHROPLASTY ANTERIOR APPROACH (Right)   Patient reports pain as mild. Pain well controlled. No events throughout the night. Ready to be discharged home.  Objective:   VITALS:   Filed Vitals:   05/04/12 0527  BP: 135/88  Pulse: 65  Temp: 98.1 F (36.7 C)  Resp: 16    Neurovascular intact Dorsiflexion/Plantar flexion intact Incision: dressing C/D/I No cellulitis present Compartment soft  LABS  Basename 05/04/12 0445  HGB 12.4*  HCT 36.5*  WBC 14.8*  PLT 213     Basename 05/04/12 0445  NA 137  K 4.0  BUN 8  CREATININE 0.73  GLUCOSE 157*     Assessment/Plan: 1 Day Post-Op Procedure(s) (LRB): TOTAL HIP ARTHROPLASTY ANTERIOR APPROACH (Right)   HV drain d/c'ed Foley cath d/c'ed Advance diet Up with therapy D/C IV fluids Discharge home with home health after PT x2   Anastasio Auerbach. Sara Selvidge   PAC  05/04/2012, 8:32 AM

## 2012-05-04 NOTE — Progress Notes (Signed)
OT Note: Checked with pt: he has had previous THA and has all DME and assistance as needed.  No OT needs.  [place], OTR/L [phone] 05/04/2012

## 2012-05-04 NOTE — Progress Notes (Signed)
Physical Therapy Treatment Patient Details Name: Curtis Lucas MRN: [id number] DOB: 04-04-1961 Today's Date: 05/04/2012 Time: 0254-2706 PT Time Calculation (min): 10 min  PT Assessment / Plan / Recommendation Comments on Treatment Session  Pt doing very well with ambulation and performed stairs this morning.  Pt given stair handout for spouse to review as she will assist him home (not present on tx).  Pt had no questions/concerns and reports he feels ready for d/c home today.    Follow Up Recommendations  Home health PT    Barriers to Discharge        Equipment Recommendations  None recommended by PT    Recommendations for Other Services    Frequency     Plan Frequency remains appropriate;Discharge plan remains appropriate    Precautions / Restrictions Precautions Precautions: None Precaution Comments: Direct Anterior approach Restrictions Weight Bearing Restrictions: No RLE Weight Bearing: Weight bearing as tolerated   Pertinent Vitals/Pain     Mobility  Bed Mobility Bed Mobility: Supine to Sit;Sit to Supine Supine to Sit: 6: Modified independent (Device/Increase time) Sit to Supine: 6: Modified independent (Device/Increase time) Transfers Transfers: Sit to Stand;Stand to Sit Sit to Stand: 5: Supervision;From bed Stand to Sit: 5: Supervision;To bed Ambulation/Gait Ambulation/Gait Assistance: 5: Supervision Ambulation Distance (Feet): 120 Feet (x2 ambulated to/from stairs) Assistive device: Rolling walker Ambulation/Gait Assistance Details: pt did well with RW, still using upper extremities on RW to assist with pain control (reports 7/10 pain after ambulation and stairs) Gait Pattern: Step-through pattern;Decreased stride length Gait velocity: decreased Stairs: Yes Stairs Assistance: 4: Min assist Stairs Assistance Details (indicate cue type and reason): verbal cues for sequence, instructed to have spouse in front and holding RW steady, also gave handout Stair  Management Technique: Backwards;With walker;Step to pattern Number of Stairs: 4     Exercises     PT Diagnosis:    PT Problem List:   PT Treatment Interventions:     PT Goals Acute Rehab PT Goals PT Goal: Sit to Stand - Progress: Progressing toward goal PT Goal: Stand to Sit - Progress: Progressing toward goal PT Goal: Ambulate - Progress: Progressing toward goal PT Goal: Up/Down Stairs - Progress: Progressing toward goal  Visit Information  Last PT Received On: 05/04/12 Assistance Needed: +1    Subjective Data  Subjective: "I'm going home today."   Cognition  Overall Cognitive Status: Appears within functional limits for tasks assessed/performed    Balance     End of Session PT - End of Session Equipment Utilized During Treatment: Gait belt Activity Tolerance: Patient tolerated treatment well Patient left: in bed;with call bell/phone within reach    [facility] E 05/04/2012, 10:35 AM Pager: [phone]

## 2012-05-04 NOTE — Discharge Summary (Signed)
Physician Discharge Summary  Patient ID: Curtis Lucas MRN: [id number] DOB/AGE: 07/08/1961 51 y.o.  Admit date: 05/03/2012 Discharge date: 05/04/2012  Procedures:  Procedure(s) (LRB): TOTAL HIP ARTHROPLASTY ANTERIOR APPROACH (Right)  Attending Physician:  Dr. Durene Romans   Admission Diagnoses:  Right hip OA and pain   Discharge Diagnoses:  Principal Problem:  *S/P right THA, AA Hypertension   GERD   CVA   Allergy - Rhinitis   Elevated lipids   Hyperglycemia  HPI: Pt is a 51 y.o. male complaining of right hip pain for 6 months. He originally had a left total hip arthroplasty from an anterior approach about 1 year ago. Right hip pain had continually increased since the beginning and now has started to affect his right and left knees as well as causing some pain in the left hip. Prior to the right hip pain she wasn't having any other joint pain. X-rays in the clinic show end-stage arthritic changes of the right hip and a well placed previous left total hip arthroplasty. Pt has tried various conservative treatments which have failed to alleviate their symptoms. Various options are discussed with the patient. Risks, benefits and expectations were discussed with the patient. Patient understand the risks, benefits and expectations and wishes to proceed with surgery.    PCP: Leo Grosser, MD, MD   Discharged Condition: good  Hospital Course:  Patient underwent the above stated procedure on 05/03/2012. Patient tolerated the procedure well and brought to the recovery room in good condition and subsequently to the floor.  POD #1 BP: 135/88 ; Pulse: 65 ; Temp: 98.1 F (36.7 C) ; Resp: 16  Pt's foley was removed, as well as the hemovac drain removed. IV was changed to a saline lock. Patient reports pain as mild. Pain well controlled. No events throughout the night. Ready to be discharged home. Neurovascular intact, dorsiflexion/plantar flexion intact, incision: dressing C/D/I, no  cellulitis present and compartment soft.   LABS  Basename  05/04/12 0445   HGB  12.4  HCT  36.5    Discharge Exam: General appearance: alert, cooperative and no distress Extremities: Homans sign is negative, no sign of DVT, no edema, redness or tenderness in the calves or thighs and no ulcers, gangrene or trophic changes  Disposition: Home  with follow up in 2 weeks  Follow-up Information    Follow up with OLIN,Wanza Szumski D in 2 weeks.   Contact information:   [facility] [address] [place] [address] [phone]          Discharge Orders    Future Orders Please Complete By Expires   Diet - low sodium heart healthy      Call MD / Call 911      Comments:   If you experience chest pain or shortness of breath, CALL 911 and be transported to the hospital emergency room.  If you develope a fever above 101 F, pus (white drainage) or increased drainage or redness at the wound, or calf pain, call your surgeon's office.   Discharge instructions      Comments:   Maintain surgical dressing for 8 days, then replace with gauze and tape. Keep the area dry and clean until follow up. Follow up in 2 weeks at [facility]. Call with any questions or concerns.     Constipation Prevention      Comments:   Drink plenty of fluids.  Prune juice may be helpful.  You may use a stool softener,  such as Colace (over the counter) 100 mg twice a day.  Use MiraLax (over the counter) for constipation as needed.   Increase activity slowly as tolerated      Weight Bearing as taught in Physical Therapy      Comments:   Use a walker or crutches as instructed.   Driving restrictions      Comments:   No driving for 4 weeks   Change dressing      Comments:   Maintain surgical dressing for 8 days, then replace with 4x4 guaze and tape. Keep the area dry and clean.   TED hose      Comments:   Use stockings (TED hose) for 2 weeks on both leg(s).  You  may remove them at night for sleeping.      Current Discharge Medication List    START taking these medications   Details  aspirin EC 325 MG tablet Take 1 tablet (325 mg total) by mouth 2 (two) times daily. X 4 weeks Qty: 60 tablet, Refills: 0    docusate sodium 100 MG CAPS Take 100 mg by mouth 2 (two) times daily.    ferrous sulfate 325 (65 FE) MG tablet Take 1 tablet (325 mg total) by mouth 3 (three) times daily after meals.    methocarbamol (ROBAXIN) 500 MG tablet Take 1 tablet (500 mg total) by mouth every 6 (six) hours as needed (muscle spasms).    oxyCODONE (OXY IR/ROXICODONE) 5 MG immediate release tablet Take 1-2 tablets (5-10 mg total) by mouth every 4 (four) hours as needed for pain. Qty: 120 tablet, Refills: 0    polyethylene glycol (MIRALAX / GLYCOLAX) packet Take 17 g by mouth daily as needed.      CONTINUE these medications which have NOT CHANGED   Details  amLODipine (NORVASC) 10 MG tablet Take 10 mg by mouth daily.      atenolol (TENORMIN) 100 MG tablet Take 100 mg by mouth daily.      pantoprazole (PROTONIX) 40 MG tablet Take 40 mg by mouth daily.      pravastatin (PRAVACHOL) 80 MG tablet Take 80 mg by mouth daily.    zolpidem (AMBIEN) 10 MG tablet Take 10 mg by mouth at bedtime as needed.      STOP taking these medications     oxyCODONE-acetaminophen (PERCOCET) 10-325 MG per tablet Comments:  Reason for Stopping:            Signed: Anastasio Auerbach. Filimon Miranda   PAC  05/04/2012, 8:36 AM

## 2012-05-04 NOTE — Progress Notes (Signed)
CARE MANAGEMENT NOTE 05/04/2012  Patient:  Curtis Lucas, Curtis Lucas   Account Number:  [id number]  Date Initiated:  05/03/2012  Documentation initiated by:  Colleen Can  Subjective/Objective Assessment:   dx anterior hip replacemnt-right     Action/Plan:   Cm spoke with patient. Plans are for patient to return to [facility] and spouse will be caregiver. He states he already has RW and does not need commode seat. he is requesting [facility] when offerd hh choice   Anticipated DC Date:  05/04/2012   Anticipated DC Plan:  HOME W HOME HEALTH SERVICES  In-house referral  NA      DC Planning Services  CM consult      [facility] Choice  HOME HEALTH   Choice offered to / List presented to:  C-1 Patient   DME arranged  NA      DME agency  NA     HH arranged  HH-2 PT      [facility] agency  [facility] Care   Status of service:  Completed, signed off Medicare Important Message given?  NO (If response is "NO", the following Medicare IM given date fields will be blank)   Discharge Disposition:  HOME W HOME HEALTH SERVICES  Per UR Regulation:  Reviewed for med. necessity/level of care/duration of stay  Comments:  05/04/2012 [name] BSN CCM [phone] Pt for discharge today. [facility] Health services in place. Start date of services 05/05/2012

## 2012-09-13 ENCOUNTER — Other Ambulatory Visit: Payer: Self-pay | Admitting: Internal Medicine

## 2012-09-13 ENCOUNTER — Ambulatory Visit
Admission: RE | Admit: 2012-09-13 | Discharge: 2012-09-13 | Disposition: A | Discharge status: Home / Self Care | Payer: Worker's Compensation | Source: Ambulatory Visit | Attending: Internal Medicine | Admitting: Internal Medicine

## 2012-09-13 DIAGNOSIS — R42 Dizziness and giddiness: Secondary | ICD-10-CM

## 2013-04-04 ENCOUNTER — Telehealth: Payer: Self-pay | Admitting: Physician Assistant

## 2013-04-04 MED ORDER — ZOLPIDEM TARTRATE 10 MG PO TABS: 10.0000 mg | ORAL_TABLET | Freq: Every evening | ORAL | Status: DC | PRN

## 2013-04-04 NOTE — Telephone Encounter (Signed)
Approved # 30 plus 2 additional refills 

## 2013-04-04 NOTE — Telephone Encounter (Signed)
Medication refilled per protocol. 

## 2013-04-04 NOTE — Telephone Encounter (Signed)
Last refill 03/08/13  Ambien 10mg 

## 2013-05-17 ENCOUNTER — Telehealth: Payer: Self-pay | Admitting: Family Medicine

## 2013-05-17 MED ORDER — AMLODIPINE BESYLATE 10 MG PO TABS: 10.0000 mg | ORAL_TABLET | Freq: Every day | ORAL | Status: DC

## 2013-05-17 NOTE — Telephone Encounter (Signed)
Medication refilled per protocol. 

## 2013-06-13 ENCOUNTER — Telehealth: Payer: Self-pay | Admitting: Family Medicine

## 2013-06-13 MED ORDER — PRAVASTATIN SODIUM 80 MG PO TABS: 80.0000 mg | ORAL_TABLET | Freq: Every day | ORAL | Status: DC

## 2013-06-13 NOTE — Telephone Encounter (Signed)
Rx Refilled  

## 2013-06-22 ENCOUNTER — Ambulatory Visit: Payer: Self-pay | Admitting: Physician Assistant

## 2013-06-26 ENCOUNTER — Telehealth: Payer: Self-pay | Admitting: Physician Assistant

## 2013-06-26 MED ORDER — ATENOLOL 100 MG PO TABS: 100.0000 mg | ORAL_TABLET | Freq: Every day | ORAL | Status: DC

## 2013-06-26 NOTE — Telephone Encounter (Signed)
Medication refilled per protocol. 

## 2013-06-28 ENCOUNTER — Telehealth: Payer: Self-pay | Admitting: Physician Assistant

## 2013-06-28 ENCOUNTER — Encounter: Payer: Self-pay | Admitting: Family Medicine

## 2013-06-28 MED ORDER — ZOLPIDEM TARTRATE 10 MG PO TABS: 10.0000 mg | ORAL_TABLET | Freq: Every evening | ORAL | Status: DC | PRN

## 2013-06-28 NOTE — Telephone Encounter (Signed)
Refill appropriate but pt overdue for 6 mth f/u appt.  Letter sent to call and schedule.  One month refill called to pharmacy.

## 2013-06-28 NOTE — Telephone Encounter (Signed)
agree

## 2013-07-27 ENCOUNTER — Other Ambulatory Visit: Payer: Self-pay | Admitting: Physician Assistant

## 2013-07-28 ENCOUNTER — Encounter: Payer: Self-pay | Admitting: Family Medicine

## 2013-07-28 NOTE — Telephone Encounter (Signed)
Medication refill for one time only.  Patient needs to be seen.  Letter sent for patient to call and schedule 

## 2013-08-14 ENCOUNTER — Other Ambulatory Visit: Payer: Self-pay | Admitting: Family Medicine

## 2013-08-23 ENCOUNTER — Other Ambulatory Visit: Payer: Self-pay | Admitting: Physician Assistant

## 2013-08-24 NOTE — Telephone Encounter (Signed)
Pt has 6 mth visit on 09/04/13  One month refill called.

## 2013-08-25 NOTE — Telephone Encounter (Signed)
Approved.  

## 2013-09-04 ENCOUNTER — Ambulatory Visit (INDEPENDENT_AMBULATORY_CARE_PROVIDER_SITE_OTHER): Payer: 59 | Admitting: Physician Assistant

## 2013-09-04 ENCOUNTER — Encounter: Payer: Self-pay | Admitting: Physician Assistant

## 2013-09-04 VITALS — BP 120/86 | HR 56 | Temp 98.3°F | Resp 18 | Ht 68.0 in | Wt 189.0 lb

## 2013-09-04 DIAGNOSIS — E785 Hyperlipidemia, unspecified: Secondary | ICD-10-CM

## 2013-09-04 DIAGNOSIS — Z125 Encounter for screening for malignant neoplasm of prostate: Secondary | ICD-10-CM

## 2013-09-04 DIAGNOSIS — I10 Essential (primary) hypertension: Secondary | ICD-10-CM

## 2013-09-04 DIAGNOSIS — R7309 Other abnormal glucose: Secondary | ICD-10-CM

## 2013-09-04 DIAGNOSIS — K219 Gastro-esophageal reflux disease without esophagitis: Secondary | ICD-10-CM

## 2013-09-04 DIAGNOSIS — R739 Hyperglycemia, unspecified: Secondary | ICD-10-CM

## 2013-09-04 DIAGNOSIS — G4700 Insomnia, unspecified: Secondary | ICD-10-CM

## 2013-09-04 LAB — COMPLETE METABOLIC PANEL WITH GFR
ALT: 25 U/L (ref 0–53)
CO2: 28 mEq/L (ref 19–32)
Calcium: 9.7 mg/dL (ref 8.4–10.5)
Chloride: 103 mEq/L (ref 96–112)
GFR, Est African American: >89 mL/min
Sodium: 137 mEq/L (ref 135–145)
Total Bilirubin: 0.6 mg/dL (ref 0.3–1.2)
Total Protein: 6.9 g/dL (ref 6.0–8.3)

## 2013-09-04 LAB — LIPID PANEL
HDL: 57 mg/dL (ref >39)
LDL Cholesterol: 131 mg/dL — ABNORMAL HIGH (ref 0–99)

## 2013-09-04 LAB — HEMOGLOBIN A1C: Hgb A1c MFr Bld: 5.8 % — ABNORMAL HIGH (ref <5.7)

## 2013-09-04 NOTE — Progress Notes (Signed)
Patient ID: Curtis Lucas MRN: [id number], DOB: 08/22/1961, 52 y.o. Date of Encounter: @DATE @  Chief Complaint:  Chief Complaint  Patient presents with  . routine check up / labs    is fasting    HPI: 52 y.o. year old married white male  presents for routine followup office visit.  Hypertension: He is taking medications as directed. He has no adverse effects. No lower extremity edema. No lightheadedness.  Hyperlipidemia: He is taking pravastatin 80 mg as directed. He has no myalgias and no other adverse effects.  Hyperglycemia: At prior office visit he told me he was eating lots of bread and also lots of diet [facility] use. He is still on drinking a diet [facility] but is not eating much bread.  GERD: He says that he only needs the Protonix as needed. He is no longer taking that on a daily basis.  Insomnia: He says he only needs a half of an Ambien but he does need to take this medicine every night otherwise cannot sleep.   Past Medical History  Diagnosis Date  . Hypertension   . GERD (gastroesophageal reflux disease)   . Allergy     Rhinitis  . Elevated lipids   . Hyperglycemia   . CVA (cerebral vascular accident)     WEAKNESS RT HAND/ POSITIONAL DOUBLE VISION   . Arthritis   . Difficulty sleeping   . Insomnia      Home Meds: See attached medication section for current medication list. Any medications entered into computer today will not appear on this note's list. The medications listed below were entered prior to today. Current Outpatient Prescriptions on File Prior to Visit  Medication Sig Dispense Refill  . amLODipine (NORVASC) 10 MG tablet TAKE 1 TABLET BY MOUTH EVERY DAY  30 tablet  0  . atenolol (TENORMIN) 100 MG tablet Take 1 tablet (100 mg total) by mouth daily.  30 tablet  2  . pantoprazole (PROTONIX) 40 MG tablet Take 40 mg by mouth daily.        . pravastatin (PRAVACHOL) 80 MG tablet Take 1 tablet (80 mg total) by mouth daily.  30 tablet  5  .  zolpidem (AMBIEN) 10 MG tablet TAKE 1 TABLET BY MOUTH AT BEDTIME AS NEEDED  30 tablet  0   No current facility-administered medications on file prior to visit.    Allergies:  Allergies  Allergen Reactions  . Lipitor [Atorvastatin Calcium]     Severe muscle spasms    History   Social History  . Marital Status: Married    Spouse Name: N/A    Number of Children: N/A  . Years of Education: N/A   Occupational History  . Not on file.   Social History Main Topics  . Smoking status: Never Smoker   . Smokeless tobacco: Not on file  . Alcohol Use: Yes     Comment: 4-5 BEERS PER WK  . Drug Use: No  . Sexual Activity:    Other Topics Concern  . Not on file   Social History Narrative  . No narrative on file    No family history on file.   Review of Systems:  See HPI for pertinent ROS. All other ROS negative.    Physical Exam: Blood pressure 120/86, pulse 56, temperature 98.3 F (36.8 C), temperature source Oral, resp. rate 18, height 5\' 8"  (1.727 m), weight 189 lb (85.73 kg)., Body mass index is 28.74 kg/(m^2). General: WNWD WM. Appears in no  acute distress. Neck: Supple. No thyromegaly. No lymphadenopathy. No carotid bruits Lungs: Clear bilaterally to auscultation without wheezes, rales, or rhonchi. Breathing is unlabored. Heart: RRR with S1 S2. No murmurs, rubs, or gallops. Abdomen: Soft, non-tender, non-distended with normoactive bowel sounds. No hepatomegaly. No rebound/guarding. No obvious abdominal masses. Musculoskeletal:  Strength and tone normal for age. Extremities/Skin: Warm and dry. No clubbing or cyanosis. No edema. No rashes or suspicious lesions. Neuro: Alert and oriented X 3. Moves all extremities spontaneously. Gait is normal. CNII-XII grossly in tact. Psych:  Responds to questions appropriately with a normal affect.     ASSESSMENT AND PLAN:  52 y.o. year old male with  1. Elevated lipids On pravastatin 80 mg. Recheck lab. - COMPLETE METABOLIC PANEL  WITH GFR - Lipid panel  2. Hypertension Blood pressure is at goal. Continue current medications and check labs monitor. - COMPLETE METABOLIC PANEL WITH GFR  3. GERD (gastroesophageal reflux disease) Continue to use PPI when necessary.  4. Insomnia Continue taking one half of an Ambien each bedtime when necessary  5. Hyperglycemia - Hemoglobin A1c  6. Screening for prostate cancer Last PSA was 04/02/12. Recheck now - PSA  #7 screening colonoscopy: Performed 09/24/2003. This was normal. He is due to repeat 09/2013. I discussed that he needs to schedule this. Also discussed that his mother had colon cancer so he is at increased risk and really needs to have followup. 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  8 immunizations  last tetanus given here was August 2004. However fills quite certain that this has been updated with his recent orthopedic surgeries.  History of occluded left ICA/left CT A. secondary to trauma from an accident  He had a left total hip replacement in May 2012 and right hip total replacement May 2013. As well he has had lumbar epidural injections in February 2012. Today he states that he's been having no pain whatsoever" feels like he is 52 years old ". He requires no type of medications for pain whatsoever finally.  Routine office visit in 6 months or sooner as needed.   Murray Hodgkins [place], [place], [facility] 09/04/2013 8:24 AM

## 2013-09-14 ENCOUNTER — Other Ambulatory Visit: Payer: Self-pay | Admitting: Physician Assistant

## 2013-09-15 ENCOUNTER — Other Ambulatory Visit: Payer: Self-pay | Admitting: Family Medicine

## 2013-09-15 NOTE — Telephone Encounter (Signed)
Ok to refill Ambien.  

## 2013-09-15 NOTE — Telephone Encounter (Signed)
2 refills on Ambien

## 2013-09-15 NOTE — Telephone Encounter (Signed)
meds refilled 

## 2013-09-17 ENCOUNTER — Other Ambulatory Visit: Payer: Self-pay | Admitting: Family Medicine

## 2013-09-25 ENCOUNTER — Other Ambulatory Visit: Payer: Self-pay | Admitting: Physician Assistant

## 2013-09-25 NOTE — Telephone Encounter (Signed)
Medication refilled per protocol. 

## 2013-12-11 ENCOUNTER — Other Ambulatory Visit: Payer: Self-pay | Admitting: Physician Assistant

## 2013-12-12 NOTE — Telephone Encounter (Signed)
Medication refilled per protocol. 

## 2014-01-07 ENCOUNTER — Other Ambulatory Visit: Payer: Self-pay | Admitting: Family Medicine

## 2014-01-10 ENCOUNTER — Other Ambulatory Visit: Payer: Self-pay | Admitting: Family Medicine

## 2014-01-13 ENCOUNTER — Other Ambulatory Visit: Payer: Self-pay | Admitting: Physician Assistant

## 2014-01-15 NOTE — Telephone Encounter (Signed)
approved for #30+2 additional refills

## 2014-01-15 NOTE — Telephone Encounter (Signed)
Last Rf 12/22  #30  Last OV 9/15  OK refill?

## 2014-01-15 NOTE — Telephone Encounter (Signed)
rx printed and fax to pharmacy

## 2014-02-02 ENCOUNTER — Ambulatory Visit (INDEPENDENT_AMBULATORY_CARE_PROVIDER_SITE_OTHER): Payer: BC Managed Care – PPO | Admitting: Family Medicine

## 2014-02-02 ENCOUNTER — Encounter: Payer: Self-pay | Admitting: Family Medicine

## 2014-02-02 VITALS — BP 110/66 | HR 78 | Temp 97.3°F | Resp 16 | Ht 70.0 in | Wt 191.0 lb

## 2014-02-02 DIAGNOSIS — J209 Acute bronchitis, unspecified: Secondary | ICD-10-CM

## 2014-02-02 MED ORDER — HYDROCODONE-HOMATROPINE 5-1.5 MG/5ML PO SYRP: 5.0000 mL | ORAL_SOLUTION | Freq: Four times a day (QID) | ORAL | Status: DC | PRN

## 2014-02-02 MED ORDER — AZITHROMYCIN 250 MG PO TABS: ORAL_TABLET | ORAL | Status: DC

## 2014-02-02 NOTE — Progress Notes (Signed)
Subjective:    Patient ID: Curtis Lucas, male    DOB: [birth year], 53 y.o.   MRN: [id number]  HPI Patient is a pleasant 53 year old white male who comes in today with a cough this lasted one week. He has chest congestion. The cough is productive of yellow purulent sputum. He reports subjective fevers and chills. He denies any rhinorrhea. He denies any antalgia. He denies any sore throat. He has some mild head congestion. However he states he is been wheezing in his airways felt tight. He denies any chest pain. He denies any pleurisy. He denies any shortness of breath. Past Medical History  Diagnosis Date  . Hypertension   . GERD (gastroesophageal reflux disease)   . Allergy     Rhinitis  . Elevated lipids   . Hyperglycemia   . CVA (cerebral vascular accident)     WEAKNESS RT HAND/ POSITIONAL DOUBLE VISION   . Arthritis   . Difficulty sleeping   . Insomnia    Current Outpatient Prescriptions on File Prior to Visit  Medication Sig Dispense Refill  . amLODipine (NORVASC) 10 MG tablet TAKE 1 TABLET BY MOUTH EVERY DAY  30 tablet  5  . aspirin 81 MG tablet Take 81 mg by mouth daily.      [name] atenolol (TENORMIN) 100 MG tablet TAKE 1 TABLET BY MOUTH ONCE DAILY  30 tablet  5  . pantoprazole (PROTONIX) 40 MG tablet TAKE 1 TABLET BY MOUTH EVERY DAY  30 tablet  11  . pravastatin (PRAVACHOL) 80 MG tablet TAKE 1 TABLET EVERY DAY  30 tablet  5  . zolpidem (AMBIEN) 10 MG tablet TAKE 1 TABLET BY MOUTH AT BEDTIME AS NEEDED  30 tablet  2   No current facility-administered medications on file prior to visit.   Allergies  Allergen Reactions  . Lipitor [Atorvastatin Calcium]     Severe muscle spasms   History   Social History  . Marital Status: Married    Spouse Name: N/A    Number of Children: N/A  . Years of Education: N/A   Occupational History  . Not on file.   Social History Main Topics  . Smoking status: Never Smoker   . Smokeless tobacco: Not on file  . Alcohol Use: Yes   Comment: 4-5 BEERS PER WK  . Drug Use: No  . Sexual Activity:    Other Topics Concern  . Not on file   Social History Narrative  . No narrative on file       Review of Systems  All other systems reviewed and are negative.       Objective:   Physical Exam  Vitals reviewed. Constitutional: He appears well-developed and well-nourished. No distress.  HENT:  Right Ear: External ear normal.  Left Ear: External ear normal.  Nose: Mucosal edema and rhinorrhea present.  Mouth/Throat: Oropharynx is clear and moist. No oropharyngeal exudate.  Eyes: Conjunctivae are normal. Pupils are equal, round, and reactive to light. No scleral icterus.  Neck: Normal range of motion. No thyromegaly present.  Cardiovascular: Normal rate, regular rhythm and normal heart sounds.   No murmur heard. Pulmonary/Chest: Effort normal. No respiratory distress. He has no wheezes. He has rales.  Lymphadenopathy:    He has no cervical adenopathy.  Skin: He is not diaphoretic.          Assessment & Plan:  1. Acute bronchitis Continue Mucinex 400 mg every 6 hours. Add Z-Pak and also take Hycodan as needed for severe  cough only  at night.  Recheck in 1 week if no better or sooner if worse. - azithromycin (ZITHROMAX) 250 MG tablet; 2 tabs poqday1, 1 tab poqday 2-5  Dispense: 6 tablet; Refill: 0 - HYDROcodone-homatropine (HYCODAN) 5-1.5 MG/5ML syrup; Take 5 mLs by mouth every 6 (six) hours as needed for cough.  Dispense: 120 mL; Refill: 0

## 2014-03-05 ENCOUNTER — Encounter: Payer: Self-pay | Admitting: Family Medicine

## 2014-03-05 ENCOUNTER — Ambulatory Visit
Admission: RE | Admit: 2014-03-05 | Discharge: 2014-03-05 | Disposition: A | Discharge status: Home / Self Care | Payer: BC Managed Care – PPO | Source: Ambulatory Visit | Attending: Family Medicine | Admitting: Family Medicine

## 2014-03-05 ENCOUNTER — Ambulatory Visit (INDEPENDENT_AMBULATORY_CARE_PROVIDER_SITE_OTHER): Payer: BC Managed Care – PPO | Admitting: Family Medicine

## 2014-03-05 VITALS — BP 134/78 | HR 76 | Temp 98.1°F | Resp 14 | Ht 70.0 in | Wt 187.0 lb

## 2014-03-05 DIAGNOSIS — R05 Cough: Secondary | ICD-10-CM

## 2014-03-05 DIAGNOSIS — R059 Cough, unspecified: Secondary | ICD-10-CM

## 2014-03-05 DIAGNOSIS — J209 Acute bronchitis, unspecified: Secondary | ICD-10-CM

## 2014-03-05 MED ORDER — HYDROCODONE-HOMATROPINE 5-1.5 MG/5ML PO SYRP: 5.0000 mL | ORAL_SOLUTION | Freq: Four times a day (QID) | ORAL | Status: DC | PRN

## 2014-03-05 NOTE — Patient Instructions (Signed)
Get Chest xray Continue cough medication F/U as needed

## 2014-03-05 NOTE — Progress Notes (Signed)
Patient ID: Curtis Lucas, male   DOB: January 03, 1961, 53 y.o.   MRN: [id number]   Subjective:    Patient ID: Curtis Lucas, male    DOB: 1961-09-29, 53 y.o.   MRN: [id number]  Patient presents for Cough   Patient here with cough with minimal production the past 6 weeks. He was treated for bronchitis on February 13 he was given a Z-Pak as well as Mucinex and cough syrup. He then presented to [facility] about a week later and was given a different antibiotic he presented last weekend and was given prednisone in a new cough medication. He continues to cough and is very concerned about this. He is a nonsmoker. He denies any acid reflux symptoms. He denies any chest pain or shortness of breath.    Review Of Systems:  GEN- denies fatigue, fever, weight loss,weakness, recent illness HEENT- denies eye drainage, change in vision, nasal discharge, CVS- denies chest pain, palpitations RESP- denies SOB,+ cough, wheeze ABD- denies N/V, change in stools, abd pain Neuro- denies headache, dizziness, syncope, seizure activity       Objective:    BP 134/78  Pulse 76  Temp(Src) 98.1 F (36.7 C)  Resp 14  Ht 5\' 10"  (1.778 m)  Wt 187 lb (84.823 kg)  BMI 26.83 kg/m2  SpO2 98% GEN- NAD, alert and oriented x3 HEENT- PERRL, EOMI, non injected sclera, pink conjunctiva, MMM, oropharynx clear TM clear bilat no effusion, no maxillary sinus tenderness, nares clear Neck- no LAD CVS- RRR, no murmur RESP-CTAB, harsh cough, no wheeze EXT- No edema Pulses- Radial 2+         Assessment & Plan:      Problem List Items Addressed This Visit   None    Visit Diagnoses   Cough    -  Primary    Relevant Orders       DG Chest 2 View    Acute bronchitis        Relevant Medications       HYDROcodone-homatropine (HYCODAN) 5-1.5 MG/5ML syrup       Note: This dictation was prepared with Dragon dictation along with smaller phrase technology. Any transcriptional errors that result from this process  are unintentional.

## 2014-03-18 ENCOUNTER — Other Ambulatory Visit: Payer: Self-pay | Admitting: Physician Assistant

## 2014-03-19 ENCOUNTER — Encounter: Payer: Self-pay | Admitting: Family Medicine

## 2014-03-19 NOTE — Telephone Encounter (Signed)
Medication refill for one time only.  Patient needs to be seen.  Letter sent for patient to call and schedule 

## 2014-03-21 ENCOUNTER — Other Ambulatory Visit: Payer: Self-pay | Admitting: Physician Assistant

## 2014-03-22 NOTE — Telephone Encounter (Signed)
Refill appropriate and filled per protocol. 

## 2014-04-09 ENCOUNTER — Other Ambulatory Visit: Payer: Self-pay | Admitting: Physician Assistant

## 2014-04-09 NOTE — Telephone Encounter (Signed)
Approved for #30+2 additional refills 

## 2014-04-09 NOTE — Telephone Encounter (Signed)
rx called in

## 2014-04-09 NOTE — Telephone Encounter (Signed)
Last Rf 1/24 #30 + 2. Last ROV 09/04/13  OK refill?

## 2014-04-14 ENCOUNTER — Other Ambulatory Visit: Payer: Self-pay | Admitting: Physician Assistant

## 2014-04-14 DIAGNOSIS — I10 Essential (primary) hypertension: Secondary | ICD-10-CM

## 2014-04-16 ENCOUNTER — Encounter: Payer: Self-pay | Admitting: Family Medicine

## 2014-04-16 NOTE — Telephone Encounter (Signed)
Medication refill for one time only.  Patient needs to be seen.  Letter sent for patient to call and schedule 

## 2014-05-20 ENCOUNTER — Other Ambulatory Visit: Payer: Self-pay | Admitting: Physician Assistant

## 2014-05-20 DIAGNOSIS — I10 Essential (primary) hypertension: Secondary | ICD-10-CM

## 2014-05-21 ENCOUNTER — Encounter: Payer: Self-pay | Admitting: Family Medicine

## 2014-05-21 NOTE — Telephone Encounter (Signed)
Medication refill for one time only.  Patient needs to be seen.  Letter sent for patient to call and schedule 

## 2014-06-14 ENCOUNTER — Other Ambulatory Visit: Payer: Self-pay | Admitting: Physician Assistant

## 2014-06-15 NOTE — Telephone Encounter (Signed)
Pt has been notified that appt is needed.  Still has not made appt.  Refill denied

## 2014-06-18 ENCOUNTER — Other Ambulatory Visit: Payer: Self-pay | Admitting: Physician Assistant

## 2014-06-18 NOTE — Telephone Encounter (Signed)
Refill appropriate and filled per protocol. 

## 2014-06-19 NOTE — Telephone Encounter (Signed)
Prescription sent to pharmacy.

## 2014-06-21 ENCOUNTER — Ambulatory Visit: Payer: BC Managed Care – PPO | Admitting: Physician Assistant

## 2014-06-27 ENCOUNTER — Encounter: Payer: Self-pay | Admitting: Physician Assistant

## 2014-06-27 ENCOUNTER — Ambulatory Visit (INDEPENDENT_AMBULATORY_CARE_PROVIDER_SITE_OTHER): Payer: BC Managed Care – PPO | Admitting: Physician Assistant

## 2014-06-27 VITALS — BP 110/76 | HR 69 | Temp 97.5°F | Resp 14 | Ht 70.0 in | Wt 190.0 lb

## 2014-06-27 DIAGNOSIS — R739 Hyperglycemia, unspecified: Secondary | ICD-10-CM

## 2014-06-27 DIAGNOSIS — R7309 Other abnormal glucose: Secondary | ICD-10-CM

## 2014-06-27 DIAGNOSIS — G4700 Insomnia, unspecified: Secondary | ICD-10-CM

## 2014-06-27 DIAGNOSIS — I639 Cerebral infarction, unspecified: Secondary | ICD-10-CM

## 2014-06-27 DIAGNOSIS — K219 Gastro-esophageal reflux disease without esophagitis: Secondary | ICD-10-CM

## 2014-06-27 DIAGNOSIS — I10 Essential (primary) hypertension: Secondary | ICD-10-CM

## 2014-06-27 DIAGNOSIS — E785 Hyperlipidemia, unspecified: Secondary | ICD-10-CM

## 2014-06-27 DIAGNOSIS — I6350 Cerebral infarction due to unspecified occlusion or stenosis of unspecified cerebral artery: Secondary | ICD-10-CM

## 2014-06-27 DIAGNOSIS — Z96649 Presence of unspecified artificial hip joint: Secondary | ICD-10-CM

## 2014-06-27 LAB — COMPLETE METABOLIC PANEL WITH GFR
ALT: 45 U/L (ref 0–53)
AST: 28 U/L (ref 0–37)
Albumin: 4.2 g/dL (ref 3.5–5.2)
Alkaline Phosphatase: 68 U/L (ref 39–117)
BILIRUBIN TOTAL: 0.3 mg/dL (ref 0.2–1.2)
BUN: 12 mg/dL (ref 6–23)
CO2: 28 meq/L (ref 19–32)
Calcium: 9.1 mg/dL (ref 8.4–10.5)
Chloride: 104 mEq/L (ref 96–112)
Creat: 0.86 mg/dL (ref 0.50–1.35)
GFR, Est Non African American: >89 mL/min
Glucose, Bld: 92 mg/dL (ref 70–99)
Potassium: 4.8 mEq/L (ref 3.5–5.3)
SODIUM: 140 meq/L (ref 135–145)
Total Protein: 6.8 g/dL (ref 6.0–8.3)

## 2014-06-27 LAB — LIPID PANEL
CHOL/HDL RATIO: 4 ratio
Cholesterol: 195 mg/dL (ref 0–200)
HDL: 49 mg/dL (ref >39)
LDL CALC: 108 mg/dL — AB (ref 0–99)
TRIGLYCERIDES: 188 mg/dL — AB (ref <150)
VLDL: 38 mg/dL (ref 0–40)

## 2014-06-27 LAB — HEMOGLOBIN A1C
HEMOGLOBIN A1C: 5.6 % (ref <5.7)
Mean Plasma Glucose: 114 mg/dL (ref <117)

## 2014-06-27 NOTE — Progress Notes (Signed)
Patient ID: Curtis Lucas MRN: [id number], DOB: 1961-08-21, 53 y.o. Date of Encounter: @DATE @  Chief Complaint:  Chief Complaint  Patient presents with  . Med check    Pt Fasting    HPI: 53 y.o. year old married white male  presents for routine followup office visit.  Hypertension: He is taking medications as directed. He has no adverse effects. No lower extremity edema. No lightheadedness.  Hyperlipidemia: He is taking pravastatin 80 mg as directed. He has no myalgias and no other adverse effects.  Hyperglycemia: At prior office visit he told me he was eating lots of bread and also lots of diet [facility] use. He is still on drinking a diet [facility] but is not eating much bread.  GERD: He says that he only needs the Protonix as needed. He is no longer taking that on a daily basis.  Insomnia: He says he only needs a half of an Ambien but he does need to take this medicine every night otherwise cannot sleep.   he has no complaints today. Says he's been feeling good. Says that he can't believe it, but he is having no pain since his surgeries.   Past Medical History  Diagnosis Date  . Hypertension   . GERD (gastroesophageal reflux disease)   . Allergy     Rhinitis  . Elevated lipids   . Hyperglycemia   . CVA (cerebral vascular accident)     WEAKNESS RT HAND/ POSITIONAL DOUBLE VISION   . Arthritis   . Difficulty sleeping   . Insomnia      Home Meds:  Outpatient Prescriptions Prior to Visit  Medication Sig Dispense Refill  . amLODipine (NORVASC) 10 MG tablet TAKE 1 TABLET BY MOUTH ONCE DAILY  30 tablet  0  . aspirin 81 MG tablet Take 81 mg by mouth daily.      [name] atenolol (TENORMIN) 100 MG tablet TAKE 1 TABLET BY MOUTH EVERY DAY  30 tablet  5  . pantoprazole (PROTONIX) 40 MG tablet TAKE 1 TABLET BY MOUTH EVERY DAY  30 tablet  11  . pravastatin (PRAVACHOL) 80 MG tablet TAKE 1 TABLET EVERY DAY  30 tablet  5  . zolpidem (AMBIEN) 10 MG tablet TAKE 1 TABLET AT  BEDTIME AS NEEDED  30 tablet  2  . HYDROcodone-homatropine (HYCODAN) 5-1.5 MG/5ML syrup Take 5 mLs by mouth every 6 (six) hours as needed for cough.  180 mL  0   No facility-administered medications prior to visit.     Allergies:  Allergies  Allergen Reactions  . Lipitor [Atorvastatin Calcium]     Severe muscle spasms    History   Social History  . Marital Status: Married    Spouse Name: N/A    Number of Children: N/A  . Years of Education: N/A   Occupational History  . Not on file.   Social History Main Topics  . Smoking status: Never Smoker   . Smokeless tobacco: Not on file  . Alcohol Use: Yes     Comment: 4-5 BEERS PER WK  . Drug Use: No  . Sexual Activity:    Other Topics Concern  . Not on file   Social History Narrative  . No narrative on file    No family history on file.   Review of Systems:  See HPI for pertinent ROS. All other ROS negative.    Physical Exam: Blood pressure 110/76, pulse 69, temperature 97.5 F (36.4 C), temperature source Oral, resp. rate  14, height 5\' 10"  (1.778 m), weight 190 lb (86.183 kg)., Body mass index is 27.26 kg/(m^2). General: WNWD WM. Appears in no acute distress. Neck: Supple. No thyromegaly. No lymphadenopathy. No carotid bruits Lungs: Clear bilaterally to auscultation without wheezes, rales, or rhonchi. Breathing is unlabored. Heart: RRR with S1 S2. No murmurs, rubs, or gallops. Abdomen: Soft, non-tender, non-distended with normoactive bowel sounds. No hepatomegaly. No rebound/guarding. No obvious abdominal masses. Musculoskeletal:  Strength and tone normal for age. Extremities/Skin: Warm and dry.  No edema.  Neuro: Alert and oriented X 3. Moves all extremities spontaneously. Gait is normal. CNII-XII grossly in tact. Psych:  Responds to questions appropriately with a normal affect.     ASSESSMENT AND PLAN:  53 y.o. year old male with  1. Elevated lipids On pravastatin 80 mg. [place] lab. - COMPLETE METABOLIC  PANEL WITH GFR - Lipid panel  2. Hypertension Blood pressure is at goal. Continue current medications and check labs monitor. - COMPLETE METABOLIC PANEL WITH GFR  3. GERD (gastroesophageal reflux disease) Continue to use PPI when necessary.  4. Insomnia Continue taking one half of an Ambien each bedtime when necessary  5. Hyperglycemia - Hemoglobin A1c  6. Screening for prostate cancer Last PSA was 09/04/2013.   #7 screening colonoscopy: Performed 09/24/2003. This was normal. He is due to repeat 09/2013. I discussed that he needs to schedule this. Also discussed that his mother had colon cancer  so he is at increased risk and really needs to have followup.  He did not f/u--says he will go for f/u when his wife goes for hers--she has never had one--says his mom was 70 y/o at dx, was in nursing home when dx.  8 immunizations  last tetanus May 2015--had finger injury--this is entered in Epic in Immunization section  History of occluded left ICA/left CT A. secondary to trauma from an accident  He had a left total hip replacement in May 2012 and right hip total replacement May 2013. As well he has had lumbar epidural injections in February 2012. Today he states that he's been having no pain whatsoever" feels like he is 53 years old ". He requires no type of medications for pain whatsoever finally.  Routine office visit in 6 months or sooner as needed.   Marin Olp [place], [place], [facility] 06/27/2014 8:42 AM

## 2014-06-28 ENCOUNTER — Encounter: Payer: Self-pay | Admitting: *Deleted

## 2014-07-03 ENCOUNTER — Other Ambulatory Visit: Payer: Self-pay | Admitting: Physician Assistant

## 2014-07-03 NOTE — Telephone Encounter (Signed)
Ok to refill??  Last office visit 06/27/2014.  Last refill 04/09/2014.

## 2014-07-04 NOTE — Telephone Encounter (Signed)
Medication called to pharmacy. 

## 2014-07-04 NOTE — Telephone Encounter (Signed)
ApProved #30+2 additional refills

## 2014-08-12 ENCOUNTER — Other Ambulatory Visit: Payer: Self-pay | Admitting: Family Medicine

## 2014-08-13 NOTE — Telephone Encounter (Signed)
Refill appropriate and filled per protocol. 

## 2014-08-14 ENCOUNTER — Other Ambulatory Visit: Payer: Self-pay | Admitting: Family Medicine

## 2014-09-09 ENCOUNTER — Other Ambulatory Visit: Payer: Self-pay | Admitting: Family Medicine

## 2014-09-10 NOTE — Telephone Encounter (Signed)
Refill appropriate and filled per protocol. 

## 2014-09-11 ENCOUNTER — Other Ambulatory Visit: Payer: Self-pay | Admitting: Family Medicine

## 2014-09-12 NOTE — Telephone Encounter (Signed)
Medication refilled per protocol. 

## 2014-09-13 ENCOUNTER — Ambulatory Visit (HOSPITAL_COMMUNITY)
Admission: RE | Admit: 2014-09-13 | Discharge: 2014-09-13 | Disposition: A | Discharge status: Home / Self Care | Payer: BC Managed Care – PPO | Source: Ambulatory Visit | Attending: Physician Assistant | Admitting: Physician Assistant

## 2014-09-13 ENCOUNTER — Ambulatory Visit (INDEPENDENT_AMBULATORY_CARE_PROVIDER_SITE_OTHER): Payer: BC Managed Care – PPO | Admitting: Physician Assistant

## 2014-09-13 ENCOUNTER — Encounter: Payer: Self-pay | Admitting: Physician Assistant

## 2014-09-13 VITALS — BP 132/90 | HR 64 | Temp 98.2°F | Resp 18 | Wt 192.0 lb

## 2014-09-13 DIAGNOSIS — S069X1S Unspecified intracranial injury with loss of consciousness of 30 minutes or less, sequela: Secondary | ICD-10-CM

## 2014-09-13 DIAGNOSIS — I6529 Occlusion and stenosis of unspecified carotid artery: Secondary | ICD-10-CM | POA: Insufficient documentation

## 2014-09-13 DIAGNOSIS — S069X9S Unspecified intracranial injury with loss of consciousness of unspecified duration, sequela: Secondary | ICD-10-CM

## 2014-09-13 DIAGNOSIS — Z8679 Personal history of other diseases of the circulatory system: Secondary | ICD-10-CM | POA: Insufficient documentation

## 2014-09-13 DIAGNOSIS — H539 Unspecified visual disturbance: Secondary | ICD-10-CM

## 2014-09-13 DIAGNOSIS — H538 Other visual disturbances: Secondary | ICD-10-CM | POA: Diagnosis present

## 2014-09-13 DIAGNOSIS — I671 Cerebral aneurysm, nonruptured: Secondary | ICD-10-CM

## 2014-09-13 DIAGNOSIS — S069XAS Unspecified intracranial injury with loss of consciousness status unknown, sequela: Secondary | ICD-10-CM

## 2014-09-13 MED ORDER — IOHEXOL 350 MG/ML SOLN
80.0000 mL | Freq: Once | INTRAVENOUS | Status: AC | PRN
  Administered 2014-09-13: 80 mL via INTRAVENOUS

## 2014-09-13 MED ORDER — SODIUM CHLORIDE 0.9 % IJ SOLN
INTRAMUSCULAR | Status: AC
  Filled 2014-09-13: qty 200

## 2014-09-13 NOTE — Progress Notes (Signed)
Patient ID: Curtis Lucas MRN: [id number], DOB: 07-14-1961, 53 y.o. Date of Encounter: @DATE @  Chief Complaint:  Chief Complaint  Patient presents with  . visual disturbance    saw eye doctor Monday, no HA's or dizziness, sudden onset    HPI: 53 y.o. year old white male  presents with his wife for office visit today.  They report that these visual disturbances started during the night Saturday night into Sunday morning--says that he had woken up to go to the bathroom at about 2 AM on Sunday morning which was 09/09/14. At that time he was seeing bright white lights shooting. Later started to also see a lot of dark masses floating around in his vision. He has seen Dr. Gershon Crane. He told them that there was no detached retina etc. and no problems with the eye itself.  Patient states that he is continuing to have these vision changes. He has noticed no other neurologic changes/neurologic deficits. No new weakness in any extremity. No changes in his gait. No slurred speech.  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 He bent down to adjust the jack and the antennae, which was on the car bumper, went into the medial aspect of his eye and went all the way up into his brain.  They were told that" blood was clotting/thrombosing. " Also says that he was found to have cerebral aneurysm "back then they did a procedure where they went in and did a balloon on each end of the aneurysm to make that area close off."  He is concerned that these current issues with his vision could be related to that history.   Past Medical History  Diagnosis Date  . Hypertension   . GERD (gastroesophageal reflux disease)   . Allergy     Rhinitis  . Elevated lipids   . Hyperglycemia   . CVA (cerebral vascular accident)     WEAKNESS RT HAND/ POSITIONAL DOUBLE VISION   . Arthritis   . Difficulty sleeping   . Insomnia      Home  Meds: Outpatient Prescriptions Prior to Visit  Medication Sig Dispense Refill  . amLODipine (NORVASC) 10 MG tablet TAKE 1 TABLET BY MOUTH EVERY DAY  30 tablet  3  . aspirin 81 MG tablet Take 81 mg by mouth daily.      [name] atenolol (TENORMIN) 100 MG tablet TAKE 1 TABLET BY MOUTH EVERY DAY  30 tablet  3  . pantoprazole (PROTONIX) 40 MG tablet TAKE 1 TABLET BY MOUTH EVERY DAY  30 tablet  11  . pravastatin (PRAVACHOL) 80 MG tablet TAKE 1 TABLET EVERY DAY  30 tablet  5  . zolpidem (AMBIEN) 10 MG tablet TAKE 1 TABLET BY MOUTH AT BEDTIME AS NEEDED  30 tablet  2  . amLODipine (NORVASC) 10 MG tablet TAKE 1 TABLET BY MOUTH EVERY DAY  30 tablet  5   No facility-administered medications prior to visit.    Allergies:  Allergies  Allergen Reactions  . Lipitor [Atorvastatin Calcium]     Severe muscle spasms    History   Social History  . Marital Status: Married    Spouse Name: N/A    Number of Children: N/A  . Years of Education: N/A   Occupational History  . Not on file.   Social History Main Topics  . Smoking status: Never Smoker   .  Smokeless tobacco: Never Used  . Alcohol Use: Yes     Comment: 4-5 BEERS PER WK  . Drug Use: No  . Sexual Activity: Not on file   Other Topics Concern  . Not on file   Social History Narrative  . No narrative on file    No family history on file.   Review of Systems:  See HPI for pertinent ROS. All other ROS negative.    Physical Exam: Blood pressure 132/90, pulse 64, temperature 98.2 F (36.8 C), temperature source Oral, resp. rate 18, weight 192 lb (87.091 kg)., Body mass index is 27.55 kg/(m^2). General: WNWD WM. Appears in no acute distress. Eye Exam Deferred as he has seen Ophthalmologist, Dr. Gershon Crane.  Neck: Supple. No thyromegaly. No lymphadenopathy. Lungs: Clear bilaterally to auscultation without wheezes, rales, or rhonchi. Breathing is unlabored. Heart: RRR with S1 S2. No murmurs, rubs, or gallops. Musculoskeletal:  Strength and  tone normal for age. Extremities/Skin: Warm and dry. Neuro: Alert and oriented X 3. Moves all extremities spontaneously. Gait is normal. CNII-XII grossly in tact. Psych:  Responds to questions appropriately with a normal affect.     ASSESSMENT AND PLAN:  53 y.o. year old male with  1. Vision abnormalities - Ambulatory referral to Neurology - CT Angio Head W/Cm &/Or Wo Cm; Future - CT Head W Contrast; Future - CT Head Wo Contrast; Future  2. Cerebral aneurysm - Ambulatory referral to Neurology - CT Angio Head W/Cm &/Or Wo Cm; Future - CT Head W Contrast; Future - CT Head Wo Contrast; Future  3. TBI (traumatic brain injury), with loss of consciousness of 30 minutes or less, sequela - Ambulatory referral to Neurology - CT Angio Head W/Cm &/Or Wo Cm; Future - CT Head W Contrast; Future - CT Head Wo Contrast; Future  He states that he does have staples in his neck. Therefore cannot do MRI. CT angiogram and CT head are being ordered now, to be done ASAP.  I will followup with him once we obtain these results. If these tests indicate further treatment necessary then this will be arranged at the time of the test results. Otherwise, if  the tests are normal, then will schedule followup with neurology.   Marin Olp [place], [place], [facility] 09/13/2014 12:28 PM

## 2014-09-14 ENCOUNTER — Encounter: Payer: Self-pay | Admitting: Diagnostic Neuroimaging

## 2014-09-14 ENCOUNTER — Ambulatory Visit (INDEPENDENT_AMBULATORY_CARE_PROVIDER_SITE_OTHER): Payer: BC Managed Care – PPO | Admitting: Diagnostic Neuroimaging

## 2014-09-14 VITALS — BP 120/79 | HR 60 | Temp 98.1°F | Ht 70.0 in | Wt 189.8 lb

## 2014-09-14 DIAGNOSIS — R519 Headache, unspecified: Secondary | ICD-10-CM

## 2014-09-14 DIAGNOSIS — L568 Other specified acute skin changes due to ultraviolet radiation: Secondary | ICD-10-CM

## 2014-09-14 DIAGNOSIS — R51 Headache: Secondary | ICD-10-CM

## 2014-09-14 DIAGNOSIS — H539 Unspecified visual disturbance: Secondary | ICD-10-CM

## 2014-09-14 NOTE — Patient Instructions (Signed)
I will order MRI brain.
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
## 2014-09-18 ENCOUNTER — Other Ambulatory Visit: Payer: Self-pay | Admitting: Diagnostic Neuroimaging

## 2014-09-18 DIAGNOSIS — R51 Headache: Secondary | ICD-10-CM

## 2014-09-18 DIAGNOSIS — L568 Other specified acute skin changes due to ultraviolet radiation: Secondary | ICD-10-CM

## 2014-09-18 DIAGNOSIS — R519 Headache, unspecified: Secondary | ICD-10-CM

## 2014-09-18 DIAGNOSIS — H539 Unspecified visual disturbance: Secondary | ICD-10-CM

## 2014-09-24 ENCOUNTER — Telehealth: Payer: Self-pay | Admitting: Diagnostic Neuroimaging
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
## 2014-09-24 NOTE — Telephone Encounter (Signed)
See phone note 10/5 

## 2014-09-26 ENCOUNTER — Other Ambulatory Visit: Payer: Self-pay

## 2014-09-28 ENCOUNTER — Ambulatory Visit
Admission: RE | Admit: 2014-09-28 | Discharge: 2014-09-28 | Disposition: A | Discharge status: Home / Self Care | Payer: BC Managed Care – PPO | Source: Ambulatory Visit | Attending: Diagnostic Neuroimaging | Admitting: Diagnostic Neuroimaging

## 2014-09-28 ENCOUNTER — Telehealth: Payer: Self-pay | Admitting: Family Medicine

## 2014-09-28 DIAGNOSIS — R519 Headache, unspecified: Secondary | ICD-10-CM

## 2014-09-28 DIAGNOSIS — L568 Other specified acute skin changes due to ultraviolet radiation: Secondary | ICD-10-CM

## 2014-09-28 DIAGNOSIS — R51 Headache: Secondary | ICD-10-CM

## 2014-09-28 DIAGNOSIS — H539 Unspecified visual disturbance: Secondary | ICD-10-CM

## 2014-09-28 MED ORDER — GADOBENATE DIMEGLUMINE 529 MG/ML IV SOLN
18.0000 mL | Freq: Once | INTRAVENOUS | Status: AC | PRN
  Administered 2014-09-28: 18 mL via INTRAVENOUS

## 2014-09-28 NOTE — Telephone Encounter (Signed)
Pharm req RF Zolpidem  Last RF 7/13 #30 + 2.  Last OV 9/24.  OK refill?

## 2014-10-01 MED ORDER — ZOLPIDEM TARTRATE 10 MG PO TABS: 10.0000 mg | ORAL_TABLET | Freq: Every evening | ORAL | Status: DC | PRN

## 2014-10-01 NOTE — Telephone Encounter (Signed)
Approved for #30+2 

## 2014-10-01 NOTE — Telephone Encounter (Signed)
RX called in .

## 2014-10-01 NOTE — Telephone Encounter (Signed)
Will check MRI brain first. -VRP

## 2014-10-02 ENCOUNTER — Encounter: Payer: Self-pay | Admitting: Physician Assistant

## 2014-10-02 ENCOUNTER — Ambulatory Visit (INDEPENDENT_AMBULATORY_CARE_PROVIDER_SITE_OTHER): Payer: BC Managed Care – PPO | Admitting: Physician Assistant

## 2014-10-02 VITALS — BP 110/60 | HR 68 | Temp 98.7°F | Resp 18 | Wt 189.0 lb

## 2014-10-02 DIAGNOSIS — B9689 Other specified bacterial agents as the cause of diseases classified elsewhere: Principal | ICD-10-CM

## 2014-10-02 DIAGNOSIS — J988 Other specified respiratory disorders: Secondary | ICD-10-CM

## 2014-10-02 MED ORDER — HYDROCODONE-HOMATROPINE 5-1.5 MG/5ML PO SYRP: 5.0000 mL | ORAL_SOLUTION | Freq: Three times a day (TID) | ORAL | Status: DC | PRN

## 2014-10-02 MED ORDER — AZITHROMYCIN 250 MG PO TABS: ORAL_TABLET | ORAL | Status: DC

## 2014-10-02 NOTE — Progress Notes (Signed)
Patient ID: Curtis Lucas MRN: [id number], DOB: May 03, 1961, 53 y.o. Date of Encounter: 10/02/2014, 11:34 AM    Chief Complaint:  Chief Complaint  Patient presents with  . sick x 5 days    started with diarrhea and sore throat, now just bad cold with cough     HPI: 53 y.o. year old male reports that he started with the above symptoms but says now he is mostly having drainage down his throat and congestion in his chest. Says that he is coughing up thick dark phlegm. Now is not having much congestion in his head and nose and not much mucus from his nose. No sore throat now. All diarrhea resolved at the beginning. Has had no known fevers or chills.     Home Meds:   Outpatient Prescriptions Prior to Visit  Medication Sig Dispense Refill  . amLODipine (NORVASC) 10 MG tablet TAKE 1 TABLET BY MOUTH EVERY DAY  30 tablet  3  . aspirin 81 MG tablet Take 81 mg by mouth daily.      [name] atenolol (TENORMIN) 100 MG tablet TAKE 1 TABLET BY MOUTH EVERY DAY  30 tablet  3  . Multiple Vitamin (MULTIVITAMIN) tablet Take 1 tablet by mouth daily.      . pantoprazole (PROTONIX) 40 MG tablet TAKE 1 TABLET BY MOUTH EVERY DAY  30 tablet  11  . pravastatin (PRAVACHOL) 80 MG tablet TAKE 1 TABLET EVERY DAY  30 tablet  5  . zolpidem (AMBIEN) 10 MG tablet Take 1 tablet (10 mg total) by mouth at bedtime as needed for sleep.  30 tablet  2   No facility-administered medications prior to visit.    Allergies:  Allergies  Allergen Reactions  . Lipitor [Atorvastatin Calcium]     Severe muscle spasms      Review of Systems: See HPI for pertinent ROS. All other ROS negative.    Physical Exam: Blood pressure 110/60, pulse 68, temperature 98.7 F (37.1 C), temperature source Oral, resp. rate 18, weight 189 lb (85.73 kg)., Body mass index is 27.12 kg/(m^2). General:  WNWD WM. Appears in no acute distress. HEENT: Normocephalic, atraumatic, eyes without discharge, sclera non-icteric, nares are without  discharge. Bilateral auditory canals clear, TM's are without perforation, pearly grey and translucent with reflective cone of light bilaterally. Oral cavity moist, posterior pharynx without exudate, erythema, peritonsillar abscess.  Neck: Supple. No thyromegaly. No lymphadenopathy. Lungs: Clear bilaterally to auscultation without wheezes, rales, or rhonchi. Breathing is unlabored. Heart: Regular rhythm. No murmurs, rubs, or gallops. Msk:  Strength and tone normal for age. Extremities/Skin: Warm and dry. Neuro: Alert and oriented X 3. Moves all extremities spontaneously. Gait is normal. CNII-XII grossly in tact. Psych:  Responds to questions appropriately with a normal affect.     ASSESSMENT AND PLAN:  53 y.o. year old male with  53. Bacterial respiratory infection - azithromycin (ZITHROMAX) 250 MG tablet; Day 1: Take 2 daily.  Days 2-5: Take 1 daily.  Dispense: 6 tablet; Refill: 0 - HYDROcodone-homatropine (HYCODAN) 5-1.5 MG/5ML syrup; Take 5 mLs by mouth every 8 (eight) hours as needed for cough.  Dispense: 120 mL; Refill: 0  Says that he and  his wife have been getting very little sleep because of his cough at night. Therefore will prescribe Hycodan to use as needed for cough suppressant. Told to use mostly at night. Recommended that he use an expectorant such as Mucinex D M. especially during the day. Take antibiotic as directed. Followup if  symptoms do not resolve within one week after completion of antibiotic.  Signed, [address] [place], [place], [facility] 10/02/2014 11:34 AM

## 2014-12-17 ENCOUNTER — Ambulatory Visit (INDEPENDENT_AMBULATORY_CARE_PROVIDER_SITE_OTHER): Payer: BC Managed Care – PPO | Admitting: Diagnostic Neuroimaging

## 2014-12-17 ENCOUNTER — Encounter: Payer: Self-pay | Admitting: Diagnostic Neuroimaging

## 2014-12-17 VITALS — BP 125/76 | HR 70 | Ht 70.0 in | Wt 192.4 lb

## 2014-12-17 DIAGNOSIS — H539 Unspecified visual disturbance: Secondary | ICD-10-CM
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
## 2014-12-23 ENCOUNTER — Other Ambulatory Visit: Payer: Self-pay | Admitting: Physician Assistant

## 2014-12-24 ENCOUNTER — Encounter: Payer: Self-pay | Admitting: Family Medicine

## 2014-12-24 NOTE — Telephone Encounter (Signed)
Refill called in.  6 month follow up due, letter sent

## 2014-12-24 NOTE — Telephone Encounter (Signed)
Approved.  # 30 +2 additional refills.

## 2014-12-24 NOTE — Telephone Encounter (Signed)
Ok to refill??  Last office visit 10/02/2014.  Last refill 10/01/2014, #2 refills.

## 2015-01-18 ENCOUNTER — Other Ambulatory Visit: Payer: Self-pay | Admitting: Physician Assistant

## 2015-01-18 ENCOUNTER — Encounter: Payer: Self-pay | Admitting: Family Medicine

## 2015-01-18 NOTE — Telephone Encounter (Signed)
Medication refill for one time only.  Patient needs to be seen.  Letter sent for patient to call and schedule 

## 2015-02-15 ENCOUNTER — Other Ambulatory Visit: Payer: Self-pay | Admitting: Physician Assistant

## 2015-02-15 NOTE — Telephone Encounter (Signed)
Medication refilled per protocol. 

## 2015-03-15 ENCOUNTER — Other Ambulatory Visit: Payer: Self-pay | Admitting: Physician Assistant

## 2015-03-15 NOTE — Telephone Encounter (Signed)
Refill appropriate and filled per protocol. 

## 2015-03-16 ENCOUNTER — Other Ambulatory Visit: Payer: Self-pay | Admitting: Physician Assistant

## 2015-03-18 ENCOUNTER — Other Ambulatory Visit: Payer: Self-pay | Admitting: Physician Assistant

## 2015-03-18 NOTE — Telephone Encounter (Signed)
Medication refill for one time only.  Patient needs to be seen.  Letter has been sent to pt x 2 to make appt

## 2015-03-19 NOTE — Telephone Encounter (Signed)
ok 

## 2015-03-19 NOTE — Telephone Encounter (Signed)
rx called in

## 2015-03-19 NOTE — Telephone Encounter (Signed)
Ok to refill??  Last office visit 12/17/2014.  Last refill 12/24/2014, #2 refills.

## 2015-04-15 ENCOUNTER — Encounter: Payer: Self-pay | Admitting: Family Medicine

## 2015-04-15 ENCOUNTER — Other Ambulatory Visit: Payer: Self-pay | Admitting: Physician Assistant

## 2015-04-15 NOTE — Telephone Encounter (Signed)
Medication refill for one time only.  Patient needs to be seen.  Letter sent for patient to call and schedule 

## 2015-05-02 ENCOUNTER — Ambulatory Visit (INDEPENDENT_AMBULATORY_CARE_PROVIDER_SITE_OTHER): Payer: BLUE CROSS/BLUE SHIELD | Admitting: Physician Assistant

## 2015-05-02 ENCOUNTER — Encounter: Payer: Self-pay | Admitting: Physician Assistant

## 2015-05-02 VITALS — BP 120/60 | HR 61 | Temp 98.5°F | Resp 19 | Ht 73.0 in | Wt 184.0 lb

## 2015-05-02 DIAGNOSIS — E785 Hyperlipidemia, unspecified: Secondary | ICD-10-CM | POA: Diagnosis not present

## 2015-05-02 DIAGNOSIS — K219 Gastro-esophageal reflux disease without esophagitis: Secondary | ICD-10-CM | POA: Diagnosis not present

## 2015-05-02 DIAGNOSIS — R739 Hyperglycemia, unspecified: Secondary | ICD-10-CM

## 2015-05-02 DIAGNOSIS — G4700 Insomnia, unspecified: Secondary | ICD-10-CM

## 2015-05-02 DIAGNOSIS — H539 Unspecified visual disturbance: Secondary | ICD-10-CM | POA: Diagnosis not present

## 2015-05-02 DIAGNOSIS — Z1211 Encounter for screening for malignant neoplasm of colon: Secondary | ICD-10-CM | POA: Diagnosis not present

## 2015-05-02 DIAGNOSIS — Z1212 Encounter for screening for malignant neoplasm of rectum: Secondary | ICD-10-CM | POA: Diagnosis not present

## 2015-05-02 DIAGNOSIS — I10 Essential (primary) hypertension: Secondary | ICD-10-CM | POA: Diagnosis not present

## 2015-05-02 DIAGNOSIS — Z96649 Presence of unspecified artificial hip joint: Secondary | ICD-10-CM

## 2015-05-02 DIAGNOSIS — Z0000 Encounter for general adult medical examination without abnormal findings: Secondary | ICD-10-CM | POA: Diagnosis not present

## 2015-05-02 DIAGNOSIS — Z9660 Presence of unspecified orthopedic joint implant: Secondary | ICD-10-CM

## 2015-05-02 DIAGNOSIS — I639 Cerebral infarction, unspecified: Secondary | ICD-10-CM

## 2015-05-02 LAB — CBC WITH DIFFERENTIAL/PLATELET
Basophils Absolute: 0 10*3/uL (ref 0.0–0.1)
Basophils Relative: 0 % (ref 0–1)
Eosinophils Absolute: 0.1 10*3/uL (ref 0.0–0.7)
Eosinophils Relative: 2 % (ref 0–5)
HEMATOCRIT: 44.3 % (ref 39.0–52.0)
Hemoglobin: 14.8 g/dL (ref 13.0–17.0)
LYMPHS ABS: 1.3 10*3/uL (ref 0.7–4.0)
LYMPHS PCT: 29 % (ref 12–46)
MCH: 30.7 pg (ref 26.0–34.0)
MCHC: 33.4 g/dL (ref 30.0–36.0)
MCV: 91.9 fL (ref 78.0–100.0)
MONOS PCT: 12 % (ref 3–12)
MPV: 10.3 fL (ref 8.6–12.4)
Monocytes Absolute: 0.5 10*3/uL (ref 0.1–1.0)
NEUTROS PCT: 57 % (ref 43–77)
Neutro Abs: 2.6 10*3/uL (ref 1.7–7.7)
Platelets: 266 10*3/uL (ref 150–400)
RBC: 4.82 MIL/uL (ref 4.22–5.81)
RDW: 13.2 % (ref 11.5–15.5)
WBC: 4.5 10*3/uL (ref 4.0–10.5)

## 2015-05-02 LAB — COMPLETE METABOLIC PANEL WITH GFR
ALT: 26 U/L (ref 0–53)
AST: 17 U/L (ref 0–37)
Albumin: 4 g/dL (ref 3.5–5.2)
Alkaline Phosphatase: 65 U/L (ref 39–117)
BILIRUBIN TOTAL: 0.7 mg/dL (ref 0.2–1.2)
BUN: 8 mg/dL (ref 6–23)
CO2: 28 meq/L (ref 19–32)
CREATININE: 0.79 mg/dL (ref 0.50–1.35)
Calcium: 9.1 mg/dL (ref 8.4–10.5)
Chloride: 106 mEq/L (ref 96–112)
GFR, Est Non African American: >89 mL/min
Glucose, Bld: 107 mg/dL — ABNORMAL HIGH (ref 70–99)
Potassium: 4.7 mEq/L (ref 3.5–5.3)
SODIUM: 140 meq/L (ref 135–145)
Total Protein: 6.5 g/dL (ref 6.0–8.3)

## 2015-05-02 LAB — LIPID PANEL
Cholesterol: 168 mg/dL (ref 0–200)
HDL: 59 mg/dL (ref >=40)
LDL CALC: 98 mg/dL (ref 0–99)
TRIGLYCERIDES: 53 mg/dL (ref <150)
Total CHOL/HDL Ratio: 2.8 Ratio
VLDL: 11 mg/dL (ref 0–40)

## 2015-05-02 LAB — TSH: TSH: 0.736 u[IU]/mL (ref 0.350–4.500)

## 2015-05-02 NOTE — Progress Notes (Signed)
Patient ID: Curtis Lucas MRN: [id number], DOB: April 09, 1961 54 y.o. Date of Encounter: 05/02/2015, 1:38 PM    Chief Complaint: Physical (CPE)  HPI: 54 y.o. y/o male here for CPE.   Has no specific complaints or concerns today.  I did review the fact that some of his recent office visit notes were regarding visual disturbance. I reviewed the note by neurology that is in the computer. Patient states that in addition to seeing neurologist he also saw a second ophthalmologist for another opinion/evaluation. He says that no one could ever come up with a definite diagnosis and he says that he is still having the symptoms.   Hypertension: He is taking medications as directed. He has no adverse effects. No lower extremity edema. No lightheadedness.  Hyperlipidemia: He is taking pravastatin 80 mg as directed. He has no myalgias and no other adverse effects.  Hyperglycemia: At prior office visit he told me he was eating lots of bread and also lots of diet [facility] use. He is still on drinking a diet [facility] but is not eating much bread.  GERD: He says that he only needs the Protonix as needed. He is no longer taking that on a daily basis.  Insomnia: He says he only needs a half of an Ambien but he does need to take this medicine every night otherwise cannot sleep.  he has no complaints today. Says he's been feeling good. Says that he can't believe it, but he is having no pain since his surgeries.    Review of Systems: Consitutional: No fever, chills, fatigue, night sweats, lymphadenopathy, or weight changes. Eyes: No  eye redness, or discharge. ENT/Mouth: Ears: No otalgia, tinnitus, hearing loss, discharge. Nose: No congestion, rhinorrhea, sinus pain, or epistaxis. Throat: No sore throat, post nasal drip, or teeth pain. Cardiovascular: No CP, palpitations, diaphoresis, DOE, edema, orthopnea, PND. Respiratory: No cough, hemoptysis, SOB, or wheezing. Gastrointestinal: No  anorexia, dysphagia, reflux, pain, nausea, vomiting, hematemesis, diarrhea, constipation, BRBPR, or melena. Genitourinary: No dysuria, frequency, urgency, hematuria, incontinence, nocturia, decreased urinary stream, discharge, impotence, or testicular pain/masses. Musculoskeletal: No decreased ROM, myalgias, stiffness, joint swelling, or weakness. Skin: No rash, erythema, lesion changes, pain, warmth, jaundice, or pruritis. Neurological: No headache, dizziness, syncope, seizures, tremors, memory loss, coordination problems, or paresthesias. Psychological: No anxiety, depression, hallucinations, SI/HI. Endocrine: No fatigue, polydipsia, polyphagia, polyuria, or known diabetes. All other systems were reviewed and are otherwise negative.  Past Medical History  Diagnosis Date  . Hypertension   . GERD (gastroesophageal reflux disease)   . Allergy     Rhinitis  . Elevated lipids   . Hyperglycemia   . CVA (cerebral vascular accident)     WEAKNESS RT HAND/ POSITIONAL DOUBLE VISION   . Arthritis   . Difficulty sleeping   . Insomnia      Past Surgical History  Procedure Laterality Date  . Total hip arthroplasty    . Lumbar epidural injection    . Head injury   1987  . Rectal surgery  2006  . Total hip arthroplasty  05/03/2012    Procedure: TOTAL HIP ARTHROPLASTY ANTERIOR APPROACH;  Surgeon: Mauri Pole, MD;  Location: [facility];  Service: Orthopedics;  Laterality: Right;    Home Meds:  Outpatient Prescriptions Prior to Visit  Medication Sig Dispense Refill  . amLODipine (NORVASC) 10 MG tablet TAKE 1 TABLET BY MOUTH EVERY DAY 30 tablet 3  . aspirin 81 MG tablet Take 81 mg by mouth daily.    [name]  atenolol (TENORMIN) 100 MG tablet TAKE 1 TABLET BY MOUTH EVERY DAY 30 tablet 2  . Multiple Vitamin (MULTIVITAMIN) tablet Take 1 tablet by mouth daily.    . pantoprazole (PROTONIX) 40 MG tablet TAKE 1 TABLET BY MOUTH EVERY DAY 30 tablet 11  . pravastatin (PRAVACHOL) 80 MG tablet TAKE 1 TABLET EVERY  DAY 30 tablet 0  . zolpidem (AMBIEN) 10 MG tablet TAKE 1 TABLET AT BEDTIME AS NEEDED 30 tablet 2   No facility-administered medications prior to visit.    Allergies:  Allergies  Allergen Reactions  . Lipitor [Atorvastatin Calcium]     Severe muscle spasms    History   Social History  . Marital Status: Married    Spouse Name: Jackelyn Poling  . Number of Children: 0  . Years of Education: HS   Occupational History  .      [employer]   Social History Main Topics  . Smoking status: Never Smoker   . Smokeless tobacco: Never Used  . Alcohol Use: Yes     Comment: 4-5 BEERS PER WK  . Drug Use: No  . Sexual Activity: Not on file   Other Topics Concern  . Not on file   Social History Narrative   Patient lives at home with spouse.   Caffeine Use:4 16oz Mt. Dew sodas daily    Family History  Problem Relation Age of Onset  . Stroke Mother   . Cancer Mother     colon  . Dementia Mother   . Parkinson's disease Father   . Heart attack Paternal Grandfather   . Dementia Paternal Grandmother     Physical Exam: Blood pressure 120/60, pulse 61, temperature 98.5 F (36.9 C), temperature source Oral, resp. rate 19, height 6\' 1"  (1.854 m), weight 184 lb (83.462 kg).  General: Well developed, well nourished,WM. Appears in no acute distress. HEENT: Normocephalic, atraumatic. Conjunctiva pink, sclera non-icteric. Pupils 2 mm constricting to 1 mm, round, regular, and equally reactive to light and accomodation. EOMI. Internal auditory canal clear. TMs with good cone of light and without pathology. Nasal mucosa pink. Nares are without discharge. No sinus tenderness. Oral mucosa pink. Pharynx without exudate.   Neck: Supple. Trachea midline. No thyromegaly. Full ROM. No lymphadenopathy. Lungs: Clear to auscultation bilaterally without wheezes, rales, or rhonchi. Breathing is of normal effort and unlabored. Cardiovascular: RRR with S1 S2. No murmurs, rubs, or gallops. Distal pulses 2+  symmetrically. No carotid or abdominal bruits. Abdomen: Soft, non-tender, non-distended with normoactive bowel sounds. No hepatosplenomegaly or masses. No rebound/guarding. No CVA tenderness. No hernias. Rectal: Deferred. Musculoskeletal: Full range of motion and 5/5 strength throughout. Without swelling, atrophy, tenderness, crepitus, or warmth. Extremities without clubbing, cyanosis, or edema. Skin: Warm and moist without erythema, ecchymosis, wounds, or rash. Neuro: A+Ox3. CN II-XII grossly intact. Moves all extremities spontaneously. Full sensation throughout. Normal gait. DTR 2+ throughout upper and lower extremities.  Psych:  Responds to questions appropriately with a normal affect.   Assessment/Plan:  54 y.o. y/o  [place] male here for CPE  -1. Visit for preventive health examination  A. Screening Labs: - CBC with Differential/Platelet - COMPLETE METABOLIC PANEL WITH GFR - Lipid panel - PSA - Vit D  25 hydroxy (rtn osteoporosis monitoring) - TSH   B. Screening For Prostate Cancer: - PSA  C.  Screening for colorectal cancer He had screening colonoscopy 09/24/2003. This was normal. Was due to repeat 09/2013. At OVs with me around 09/2013--we discussed but he deferred.   In  the past patient had deferred colonoscopy but today he is agreeable for me to go ahead and refer him to GI to have this performed. - Ambulatory referral to Gastroenterology   D. Immunizations: Flu-----------------------N/A Tetanus----------------he received T dap 04/20/2014 Pneumococcal-------he has no indication to need pneumonia vaccine until age 54 Zostavax-------------- will discuss at age 41   1. Elevated lipids On pravastatin 80 mg. [place] lab. - COMPLETE METABOLIC PANEL WITH GFR - Lipid panel  2. Hypertension Blood pressure is at goal. Continue current medications and check labs monitor. - COMPLETE METABOLIC PANEL WITH GFR  3. GERD (gastroesophageal reflux disease) Continue to use PPI when  necessary.  4. Insomnia Continue taking one half of an Ambien each bedtime when necessary  5. Hyperglycemia - Hemoglobin A1c  6.CVA (cerebral vascular accident)-- History of occluded left ICA/left CT A. secondary to trauma from an accident  7. S/P right THA, AA-- He had a left total hip replacement in May 2012 and right hip total replacement May 2013. As well he has had lumbar epidural injections in February 2012. Today he states that he's been having no pain whatsoever" feels like he is 54 years old ". He requires no type of medications for pain whatsoever finally.  8. Visual disturbance See HPI.   Has had evaluation with neurologist and ophthalmologist and no etiology could be determined. Pt reports the symptoms persist.  Routine office visit in 6 months or sooner as needed.      9. Visual disturbance   Signed:   [address] [place], [place]  05/02/2015 1:38 PM

## 2015-05-03 LAB — HEMOGLOBIN A1C
Hgb A1c MFr Bld: 5.7 % — ABNORMAL HIGH (ref <5.7)
MEAN PLASMA GLUCOSE: 117 mg/dL — AB (ref <117)

## 2015-05-03 LAB — VITAMIN D 25 HYDROXY (VIT D DEFICIENCY, FRACTURES): Vit D, 25-Hydroxy: 17 ng/mL — ABNORMAL LOW (ref 30–100)

## 2015-05-03 LAB — PSA: PSA: 0.54 ng/mL (ref <=4.00)

## 2015-05-06 ENCOUNTER — Telehealth: Payer: Self-pay | Admitting: Family Medicine

## 2015-05-06 DIAGNOSIS — E559 Vitamin D deficiency, unspecified: Secondary | ICD-10-CM | POA: Insufficient documentation

## 2015-05-06 NOTE — Telephone Encounter (Signed)
-----   Message from Orlena Sheldon, PA-C sent at 05/03/2015 11:56 AM EDT ----- All labs are excellent--except Vitamin D low. Start otc Vitamin D 2,000 IU QD dose.

## 2015-05-06 NOTE — Telephone Encounter (Signed)
Pt aware of lab results and provider recommendations 

## 2015-05-14 IMAGING — CR DG ORBITS FOR FOREIGN BODY
2 series · 2 of 2 positions shown · non-contrast
Comparison: None.

CLINICAL DATA: Metal working/exposure; clearance prior to MRI

EXAM:
ORBITS FOR FOREIGN BODY - 2 VIEW

[view not recorded (1 of 2)]
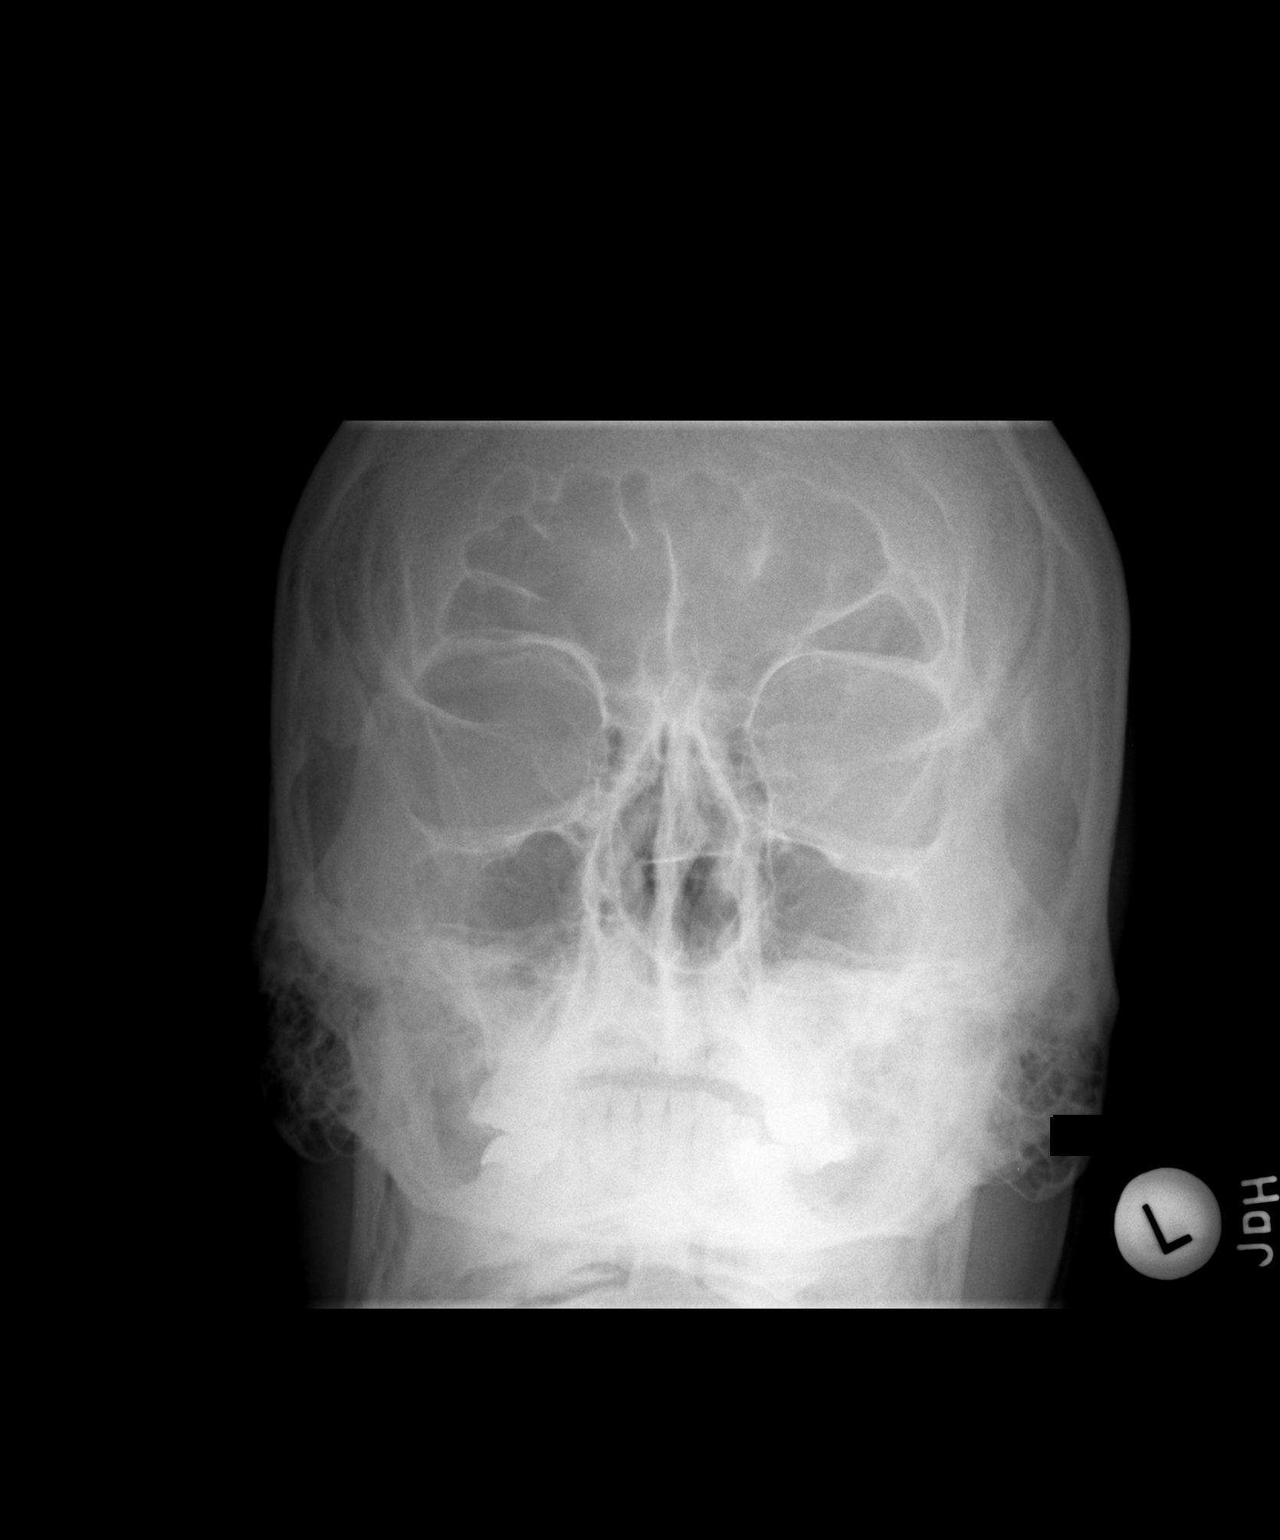

[view not recorded (2 of 2)]
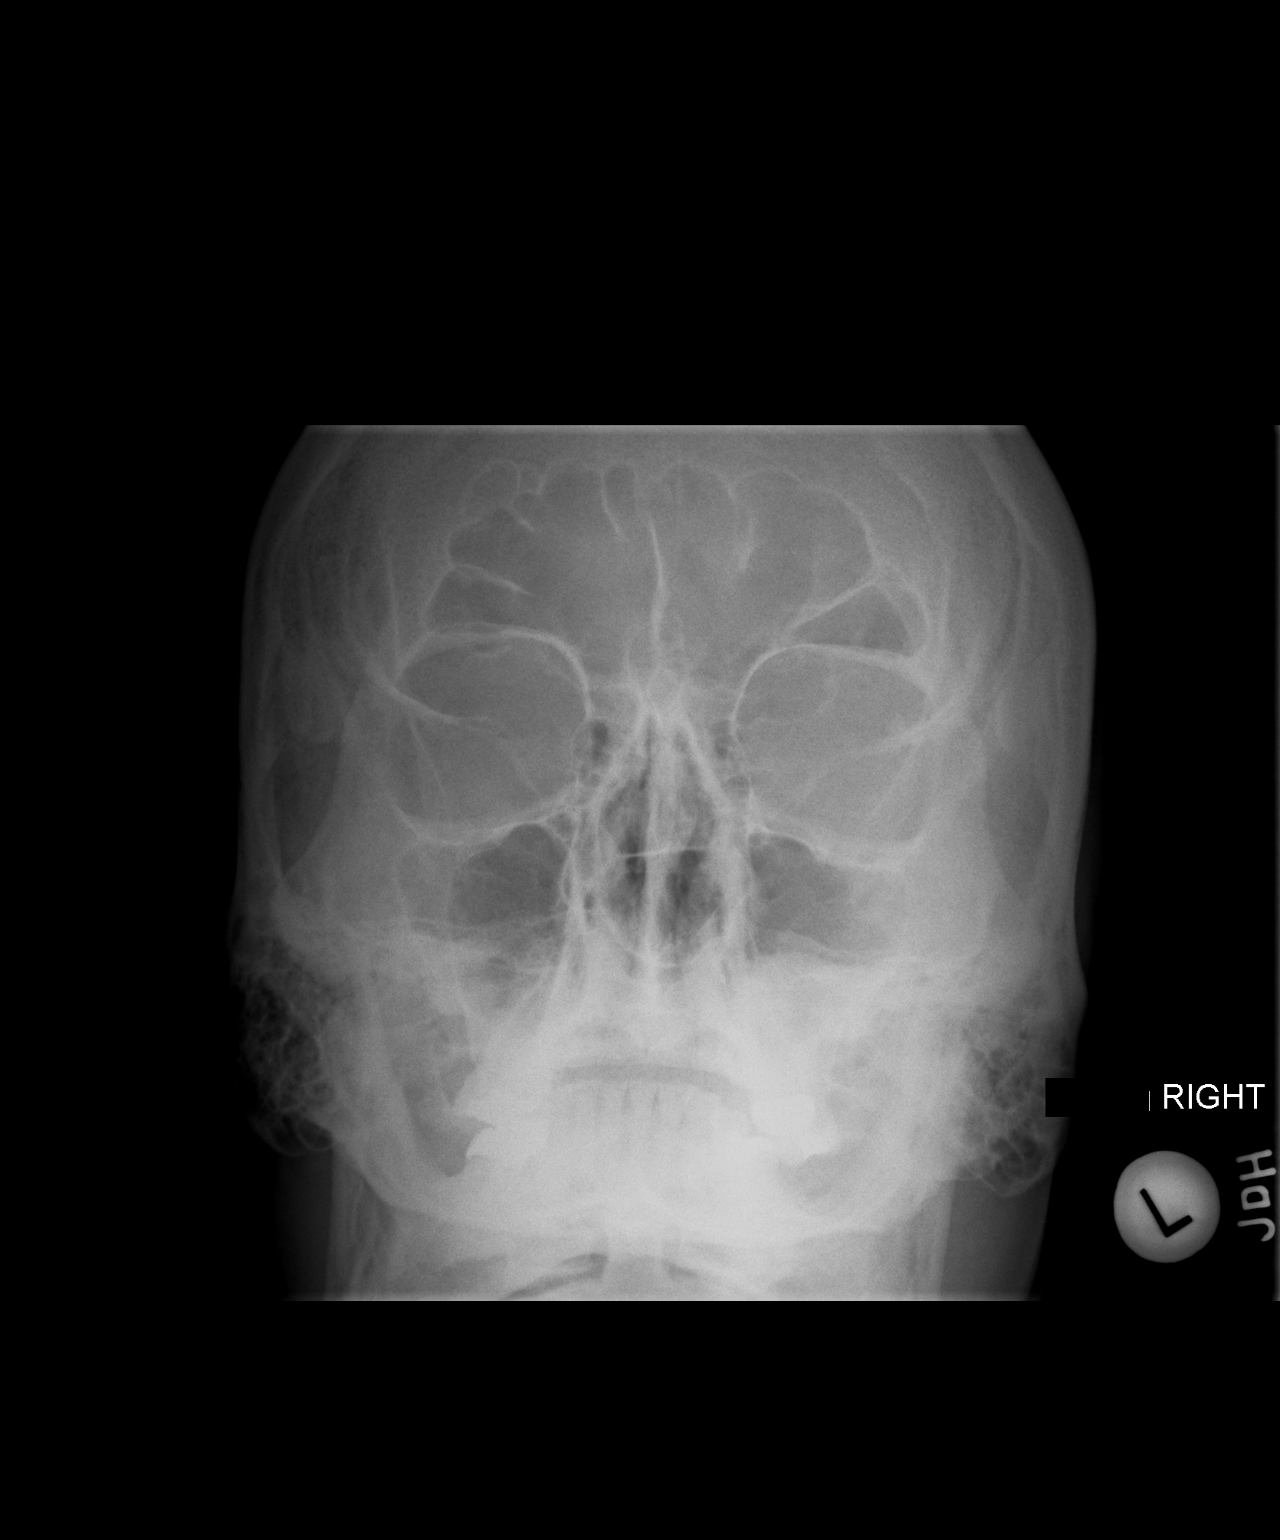

[2 of 2 positions shown; findings below may reference images not displayed]

FINDINGS: Water's views with eyes deviated toward the left and toward the
right were obtained. There is no intraorbital radiopaque foreign
body. Paranasal sinuses clear. No fracture or dislocation.
IMPRESSION: No evidence of metallic foreign body within the orbits.

## 2015-05-24 ENCOUNTER — Other Ambulatory Visit: Payer: Self-pay | Admitting: Physician Assistant

## 2015-06-11 ENCOUNTER — Other Ambulatory Visit: Payer: Self-pay | Admitting: Physician Assistant

## 2015-06-11 NOTE — Telephone Encounter (Signed)
Medication refilled per protocol. 

## 2015-06-13 ENCOUNTER — Other Ambulatory Visit: Payer: Self-pay | Admitting: Family Medicine

## 2015-06-14 NOTE — Telephone Encounter (Signed)
Medication refilled per protocol. 

## 2015-06-14 NOTE — Telephone Encounter (Signed)
?   OK to Refill LOV - 05/02/15 - CPE Last refill 03/19/15 #30/2

## 2015-06-14 NOTE — Telephone Encounter (Signed)
Approved for #30+2 

## 2015-06-22 ENCOUNTER — Other Ambulatory Visit: Payer: Self-pay | Admitting: Physician Assistant

## 2015-06-25 NOTE — Telephone Encounter (Signed)
Medication refilled per protocol. 

## 2015-07-06 ENCOUNTER — Other Ambulatory Visit: Payer: Self-pay | Admitting: Physician Assistant

## 2015-07-09 NOTE — Telephone Encounter (Signed)
Medication refilled per protocol. 

## 2015-08-05 ENCOUNTER — Other Ambulatory Visit: Payer: Self-pay | Admitting: Physician Assistant

## 2015-08-06 NOTE — Telephone Encounter (Signed)
Refill appropriate and filled per protocol. 

## 2015-08-12 ENCOUNTER — Other Ambulatory Visit: Payer: Self-pay | Admitting: Physician Assistant

## 2015-08-13 NOTE — Telephone Encounter (Signed)
Medication refilled per protocol. 

## 2015-09-09 ENCOUNTER — Other Ambulatory Visit: Payer: Self-pay | Admitting: Physician Assistant

## 2015-09-09 NOTE — Telephone Encounter (Signed)
Approved. #30+2. 

## 2015-09-09 NOTE — Telephone Encounter (Signed)
Medication called to pharmacy. 

## 2015-09-09 NOTE — Telephone Encounter (Signed)
Ok to refill??  Last office visit 05/02/2015.  Last refill 06/14/2015, #2 refills.

## 2015-11-01 ENCOUNTER — Telehealth: Payer: Self-pay | Admitting: Physician Assistant

## 2015-11-01 NOTE — Telephone Encounter (Signed)
Pharmacy: [facility]  Medication:zolpidem (AMBIEN) 10 MG tablet  Qty:  Sig:  Physician: Mary B. Dixon  Patient's phone number: [phone] Requesting to refill this before the 16th.

## 2015-11-01 NOTE — Telephone Encounter (Signed)
Ok to refill 

## 2015-11-04 MED ORDER — ZOLPIDEM TARTRATE 10 MG PO TABS: 10.0000 mg | ORAL_TABLET | Freq: Every evening | ORAL | Status: DC | PRN

## 2015-11-04 NOTE — Telephone Encounter (Signed)
Medication refilled per protocol. 

## 2015-11-04 NOTE — Telephone Encounter (Signed)
Approved. #30+2. 

## 2015-12-01 ENCOUNTER — Other Ambulatory Visit: Payer: Self-pay | Admitting: Physician Assistant

## 2015-12-02 ENCOUNTER — Encounter: Payer: Self-pay | Admitting: Family Medicine

## 2015-12-02 NOTE — Telephone Encounter (Signed)
Medication refill for one time only.  Patient needs to be seen.  Letter sent for patient to call and schedule 

## 2015-12-14 ENCOUNTER — Other Ambulatory Visit: Payer: Self-pay | Admitting: Physician Assistant

## 2015-12-17 ENCOUNTER — Encounter: Payer: Self-pay | Admitting: Family Medicine

## 2015-12-17 NOTE — Telephone Encounter (Signed)
Medication refill for one time only.  Patient needs to be seen.  Letter sent for patient to call and schedule 

## 2016-01-15 ENCOUNTER — Encounter: Payer: Self-pay | Admitting: Physician Assistant

## 2016-01-15 ENCOUNTER — Ambulatory Visit (INDEPENDENT_AMBULATORY_CARE_PROVIDER_SITE_OTHER): Payer: BLUE CROSS/BLUE SHIELD | Admitting: Physician Assistant

## 2016-01-15 VITALS — BP 110/80 | HR 78 | Temp 98.4°F | Resp 20 | Wt 192.0 lb

## 2016-01-15 DIAGNOSIS — I10 Essential (primary) hypertension: Secondary | ICD-10-CM | POA: Diagnosis not present

## 2016-01-15 DIAGNOSIS — K219 Gastro-esophageal reflux disease without esophagitis: Secondary | ICD-10-CM | POA: Diagnosis not present

## 2016-01-15 DIAGNOSIS — E559 Vitamin D deficiency, unspecified: Secondary | ICD-10-CM | POA: Diagnosis not present

## 2016-01-15 DIAGNOSIS — M545 Low back pain, unspecified: Secondary | ICD-10-CM

## 2016-01-15 DIAGNOSIS — R739 Hyperglycemia, unspecified: Secondary | ICD-10-CM

## 2016-01-15 DIAGNOSIS — E785 Hyperlipidemia, unspecified: Secondary | ICD-10-CM | POA: Diagnosis not present

## 2016-01-15 DIAGNOSIS — G4700 Insomnia, unspecified: Secondary | ICD-10-CM | POA: Diagnosis not present

## 2016-01-15 LAB — LIPID PANEL
CHOLESTEROL: 188 mg/dL (ref 125–200)
HDL: 49 mg/dL (ref >=40)
LDL Cholesterol: 123 mg/dL (ref <130)
Total CHOL/HDL Ratio: 3.8 Ratio (ref <=5.0)
Triglycerides: 80 mg/dL (ref <150)
VLDL: 16 mg/dL (ref <30)

## 2016-01-15 LAB — COMPLETE METABOLIC PANEL WITH GFR
ALBUMIN: 4 g/dL (ref 3.6–5.1)
ALT: 25 U/L (ref 9–46)
AST: 18 U/L (ref 10–35)
Alkaline Phosphatase: 58 U/L (ref 40–115)
BUN: 10 mg/dL (ref 7–25)
CALCIUM: 9.3 mg/dL (ref 8.6–10.3)
CHLORIDE: 101 mmol/L (ref 98–110)
CO2: 29 mmol/L (ref 20–31)
CREATININE: 0.8 mg/dL (ref 0.70–1.33)
GFR, Est African American: >89 mL/min (ref >=60)
GFR, Est Non African American: >89 mL/min (ref >=60)
GLUCOSE: 100 mg/dL — AB (ref 70–99)
Potassium: 4.6 mmol/L (ref 3.5–5.3)
SODIUM: 139 mmol/L (ref 135–146)
Total Bilirubin: 0.6 mg/dL (ref 0.2–1.2)
Total Protein: 6.8 g/dL (ref 6.1–8.1)

## 2016-01-15 LAB — HEMOGLOBIN A1C
Hgb A1c MFr Bld: 5.7 % — ABNORMAL HIGH (ref <5.7)
Mean Plasma Glucose: 117 mg/dL — ABNORMAL HIGH (ref <117)

## 2016-01-15 MED ORDER — MELOXICAM 7.5 MG PO TABS: 7.5000 mg | ORAL_TABLET | Freq: Every day | ORAL | Status: DC

## 2016-01-15 MED ORDER — METAXALONE 800 MG PO TABS: 800.0000 mg | ORAL_TABLET | Freq: Three times a day (TID) | ORAL | Status: DC

## 2016-01-15 NOTE — Progress Notes (Signed)
Patient ID: Curtis Lucas MRN: [id number], DOB: [birth year] 55 y.o. Date of Encounter: 01/15/2016, 8:45 AM    Chief Complaint: Routine office visit and labs  HPI: 55 y.o. y/o male here for routine office visit and labs.   I did review the fact that some of his recent office visit notes were regarding visual disturbance. I reviewed the note by neurology that is in the computer. Patient reports that in addition to seeing neurologist he also saw a second ophthalmologist for another opinion/evaluation. He says that no one could ever come up with a definite diagnosis and he says that he is still having the symptoms.   Hypertension: He is taking medications as directed. He has no adverse effects. No lower extremity edema. No lightheadedness.  Hyperlipidemia: He is taking pravastatin 80 mg as directed. He has no myalgias and no other adverse effects.  Hyperglycemia: At prior office visit he told me he was eating lots of bread and also lots of diet [facility] use. He is still on drinking a diet [facility] but is not eating much bread.  GERD: He says that he only needs the Protonix as needed. He is no longer taking that on a daily basis.  Insomnia: He says he only needs a half of an Ambien but he does need to take this medicine every night otherwise cannot sleep.  At visit 01/15/16 he reports that he has had some discomfort in the right low back off and on over the last several months. He says that he will have no pain and then all of a sudden significant amount of pain will hit. Has had no pain no numbness no tingling no weakness down the leg or foot.  No other complaints or concerns.    Review of Systems: Consitutional: No fever, chills, fatigue, night sweats, lymphadenopathy, or weight changes. Eyes: No  eye redness, or discharge. ENT/Mouth: Ears: No otalgia, tinnitus, hearing loss, discharge. Nose: No congestion, rhinorrhea, sinus pain, or epistaxis. Throat: No sore throat,  post nasal drip, or teeth pain. Cardiovascular: No CP, palpitations, diaphoresis, DOE, edema, orthopnea, PND. Respiratory: No cough, hemoptysis, SOB, or wheezing. Gastrointestinal: No anorexia, dysphagia, reflux, pain, nausea, vomiting, hematemesis, diarrhea, constipation, BRBPR, or melena. Genitourinary: No dysuria, frequency, urgency, hematuria, incontinence, nocturia, decreased urinary stream, discharge, impotence, or testicular pain/masses. Musculoskeletal: No decreased ROM, myalgias, stiffness, joint swelling, or weakness. Skin: No rash, erythema, lesion changes, pain, warmth, jaundice, or pruritis. Neurological: No headache, dizziness, syncope, seizures, tremors, memory loss, coordination problems, or paresthesias. Psychological: No anxiety, depression, hallucinations, SI/HI. Endocrine: No fatigue, polydipsia, polyphagia, polyuria, or known diabetes. All other systems were reviewed and are otherwise negative.  Past Medical History  Diagnosis Date  . Hypertension   . GERD (gastroesophageal reflux disease)   . Allergy     Rhinitis  . Elevated lipids   . Hyperglycemia   . CVA (cerebral vascular accident) ([place])     WEAKNESS RT HAND/ POSITIONAL DOUBLE VISION   . Arthritis   . Difficulty sleeping   . Insomnia      Past Surgical History  Procedure Laterality Date  . Total hip arthroplasty    . Lumbar epidural injection    . Head injury   1987  . Rectal surgery  2006  . Total hip arthroplasty  05/03/2012    Procedure: TOTAL HIP ARTHROPLASTY ANTERIOR APPROACH;  Surgeon: Mauri Pole, MD;  Location: [facility];  Service: Orthopedics;  Laterality: Right;    Home Meds:  Outpatient  Prescriptions Prior to Visit  Medication Sig Dispense Refill  . amLODipine (NORVASC) 10 MG tablet TAKE 1 TABLET BY MOUTH EVERY DAY 30 tablet 0  . aspirin 81 MG tablet Take 81 mg by mouth daily.    [name] atenolol (TENORMIN) 100 MG tablet TAKE 1 TABLET BY MOUTH EVERY DAY 90 tablet 0  . Cholecalciferol (VITAMIN  D) 2000 UNITS tablet Take 2,000 Units by mouth daily.    . Multiple Vitamin (MULTIVITAMIN) tablet Take 1 tablet by mouth daily.    . pantoprazole (PROTONIX) 40 MG tablet TAKE 1 TABLET BY MOUTH EVERY DAY 90 tablet 1  . pravastatin (PRAVACHOL) 80 MG tablet TAKE 1 TABLET EVERY DAY 30 tablet 0  . zolpidem (AMBIEN) 10 MG tablet Take 1 tablet (10 mg total) by mouth at bedtime as needed. 30 tablet 2   No facility-administered medications prior to visit.    Allergies:  Allergies  Allergen Reactions  . Lipitor [Atorvastatin Calcium]     Severe muscle spasms    Social History   Social History  . Marital Status: Married    Spouse Name: Jackelyn Poling  . Number of Children: 0  . Years of Education: HS   Occupational History  .      [employer]   Social History Main Topics  . Smoking status: Never Smoker   . Smokeless tobacco: Never Used  . Alcohol Use: Yes     Comment: 4-5 BEERS PER WK  . Drug Use: No  . Sexual Activity: Not on file   Other Topics Concern  . Not on file   Social History Narrative   Patient lives at home with spouse.   Caffeine Use:4 16oz Mt. Dew sodas daily    Family History  Problem Relation Age of Onset  . Stroke Mother   . Cancer Mother     colon  . Dementia Mother   . Parkinson's disease Father   . Heart attack Paternal Grandfather   . Dementia Paternal Grandmother     Physical Exam: Blood pressure 110/80, pulse 78, temperature 98.4 F (36.9 C), temperature source Oral, resp. rate 20, weight 192 lb (87.091 kg).  General: Well developed, well nourished,WM. Appears in no acute distress. Neck: Supple. Trachea midline. No thyromegaly. Full ROM. No lymphadenopathy. Lungs: Clear to auscultation bilaterally without wheezes, rales, or rhonchi. Breathing is of normal effort and unlabored. Cardiovascular: RRR with S1 S2. No murmurs, rubs, or gallops. Distal pulses 2+ symmetrically. No carotid or abdominal bruits. Abdomen: Soft, non-tender, non-distended  with normoactive bowel sounds. No hepatosplenomegaly or masses. No rebound/guarding. No CVA tenderness. No hernias. Musculoskeletal: Full range of motion and 5/5 strength throughout.  Right Back at approx L5 level is area he points to as area of intermittent pain. No pain at present time. Normal tenderness reproduced with palpation at this time.  Straight leg raise and hip abduction are normal bilaterally. 2+, equal pattella reflexes bilaterally.  Skin: Warm and moist without erythema, ecchymosis, wounds, or rash. Neuro: A+Ox3. CN II-XII grossly intact. Moves all extremities spontaneously. Full sensation throughout. Normal gait.  Psych:  Responds to questions appropriately with a normal affect.   Assessment/Plan:  55 y.o. y/o  [place] male here for   Elevated lipids On pravastatin 80 mg. [place] lab. - COMPLETE METABOLIC PANEL WITH GFR - Lipid panel  Hypertension Blood pressure is at goal. Continue current medications and check labs monitor. - COMPLETE METABOLIC PANEL WITH GFR   Vitamin D deficiency Lab 05/02/15---Vitiman D level was low at 17----was  treated with repletion.---Will recheck level now. - VITAMIN D 25 Hydroxy (Vit-D Deficiency, Fractures)   GERD (gastroesophageal reflux disease) Continue to use PPI when necessary.  Insomnia Continue taking one half of an Ambien each bedtime when necessary  Hyperglycemia This has been managed with diet changes. Will continue to check lab to monitor. - Hemoglobin A1c  CVA (cerebral vascular accident)-- History of occluded left ICA/left CT A. secondary to trauma from an accident  S/P right THA, AA-- He had a left total hip replacement in May 2012 and right hip total replacement May 2013. As well he has had lumbar epidural injections in February 2012. Today he states that he's been having no pain whatsoever" feels like he is 55 years old ". He requires no type of medications for pain whatsoever finally.  Visual disturbance See HPI.     Has had evaluation with neurologist and ophthalmologist and no etiology could be determined. Pt reports the symptoms persist.  Right-sided low back pain without sciatica Discussed with him that the Skelaxin may cause drowsiness. If cause this is drowsiness and only take at night. It does not cause drowsiness than take 3 times daily as needed. Discussed that the Motrin because an anti-inflammatory and needs to be taken with food and not to take it with/in addition to over-the-counter anti-inflammatories. - metaxalone (SKELAXIN) 800 MG tablet; Take 1 tablet (800 mg total) by mouth 3 (three) times daily.  Dispense: 60 tablet; Refill: 1 - meloxicam (MOBIC) 7.5 MG tablet; Take 1 tablet (7.5 mg total) by mouth daily. Take with food.  Dispense: 30 tablet; Refill: 2     THE FOLLOWING IS COPIED FROM CPE 05/02/2015: -1. Visit for preventive health examination  A. Screening Labs: - CBC with Differential/Platelet - COMPLETE METABOLIC PANEL WITH GFR - Lipid panel - PSA - Vit D  25 hydroxy (rtn osteoporosis monitoring) - TSH   B. Screening For Prostate Cancer: - PSA  C.  Screening for colorectal cancer He had screening colonoscopy 09/24/2003. This was normal. Was due to repeat 09/2013. At OVs with me around 09/2013--we discussed but he deferred.   In the past patient had deferred colonoscopy but today he is agreeable for me to go ahead and refer him to GI to have this performed. - Ambulatory referral to Gastroenterology  ADDENDUM--Added 01/15/2016:  At [facility] 01/15/16 asked patient if he followed up with colonoscopy. He says that at the time of his appointment was scheduled he was having to work a lot of overtime at work so was unable to go to that appointment. Says things have continued to be very busy at work. Says currently one employee is out with hip surgery. Says that when he things calm down at work and schedule permits he will call me and I will place another order for this.  D.  Immunizations: Flu-----------------------N/A Tetanus----------------he received T dap 04/20/2014 Pneumococcal-------he has no indication to need pneumonia vaccine until age 1 Zostavax-------------- will discuss at age 43    Routine office visit in 6 months or sooner as needed.     Signed:   [address] [place], [place]  01/15/2016 8:45 AM

## 2016-01-16 ENCOUNTER — Other Ambulatory Visit: Payer: Self-pay | Admitting: Family Medicine

## 2016-01-16 ENCOUNTER — Encounter: Payer: Self-pay | Admitting: Family Medicine

## 2016-01-16 LAB — VITAMIN D 25 HYDROXY (VIT D DEFICIENCY, FRACTURES): Vit D, 25-Hydroxy: 23 ng/mL — ABNORMAL LOW (ref 30–100)

## 2016-01-22 ENCOUNTER — Other Ambulatory Visit: Payer: Self-pay | Admitting: Physician Assistant

## 2016-01-22 NOTE — Telephone Encounter (Signed)
Medication refilled per protocol. 

## 2016-01-30 ENCOUNTER — Other Ambulatory Visit: Payer: Self-pay | Admitting: Physician Assistant

## 2016-01-30 NOTE — Telephone Encounter (Signed)
Medication refilled per protocol. 

## 2016-02-03 ENCOUNTER — Other Ambulatory Visit: Payer: Self-pay | Admitting: Physician Assistant

## 2016-02-03 NOTE — Telephone Encounter (Signed)
Rx called in 

## 2016-02-03 NOTE — Telephone Encounter (Signed)
LRF 11/04/15 #30 + 2  LOV 01/15/16   Ok refill?

## 2016-02-03 NOTE — Telephone Encounter (Signed)
Approved. #30+2. 

## 2016-02-29 ENCOUNTER — Other Ambulatory Visit: Payer: Self-pay | Admitting: Physician Assistant

## 2016-03-02 NOTE — Telephone Encounter (Signed)
Medication refilled per protocol. 

## 2016-03-18 ENCOUNTER — Telehealth: Payer: Self-pay | Admitting: Physician Assistant

## 2016-03-18 NOTE — Telephone Encounter (Signed)
Patient would like recommendation to a  Good eye doc  [phone]

## 2016-03-19 NOTE — Telephone Encounter (Signed)
LMTCB

## 2016-03-30 NOTE — Telephone Encounter (Signed)
Spoke to wife today.  She said he made an appointment.

## 2016-04-24 ENCOUNTER — Other Ambulatory Visit: Payer: Self-pay | Admitting: Physician Assistant

## 2016-04-24 NOTE — Telephone Encounter (Signed)
Refill appropriate and filled per protocol. 

## 2016-04-29 ENCOUNTER — Other Ambulatory Visit: Payer: Self-pay | Admitting: Physician Assistant

## 2016-04-30 NOTE — Telephone Encounter (Signed)
Medication called to pharmacy. 

## 2016-04-30 NOTE — Telephone Encounter (Signed)
Approved. #30+2. 

## 2016-04-30 NOTE — Telephone Encounter (Signed)
Ok to refill??  Last office visit 01/15/2016.  Last refill 02/03/2016, #2 refills.

## 2016-06-27 ENCOUNTER — Other Ambulatory Visit: Payer: Self-pay | Admitting: Physician Assistant

## 2016-06-29 NOTE — Telephone Encounter (Signed)
Medication refilled per protocol. 

## 2016-07-03 ENCOUNTER — Other Ambulatory Visit: Payer: Self-pay | Admitting: Physician Assistant

## 2016-07-03 NOTE — Telephone Encounter (Signed)
Refill appropriate and filled per protocol. 

## 2016-07-16 ENCOUNTER — Encounter: Payer: Self-pay | Admitting: Physician Assistant

## 2016-07-16 ENCOUNTER — Ambulatory Visit (INDEPENDENT_AMBULATORY_CARE_PROVIDER_SITE_OTHER): Payer: BLUE CROSS/BLUE SHIELD | Admitting: Physician Assistant

## 2016-07-16 VITALS — BP 112/70 | HR 68 | Temp 98.9°F | Resp 16 | Ht 73.0 in | Wt 184.0 lb

## 2016-07-16 DIAGNOSIS — E785 Hyperlipidemia, unspecified: Secondary | ICD-10-CM

## 2016-07-16 DIAGNOSIS — K219 Gastro-esophageal reflux disease without esophagitis: Secondary | ICD-10-CM

## 2016-07-16 DIAGNOSIS — R739 Hyperglycemia, unspecified: Secondary | ICD-10-CM

## 2016-07-16 DIAGNOSIS — Z1212 Encounter for screening for malignant neoplasm of rectum: Secondary | ICD-10-CM | POA: Diagnosis not present

## 2016-07-16 DIAGNOSIS — E559 Vitamin D deficiency, unspecified: Secondary | ICD-10-CM

## 2016-07-16 DIAGNOSIS — Z1211 Encounter for screening for malignant neoplasm of colon: Secondary | ICD-10-CM

## 2016-07-16 DIAGNOSIS — I638 Other cerebral infarction: Secondary | ICD-10-CM | POA: Diagnosis not present

## 2016-07-16 DIAGNOSIS — I10 Essential (primary) hypertension: Secondary | ICD-10-CM

## 2016-07-16 DIAGNOSIS — I6389 Other cerebral infarction: Secondary | ICD-10-CM

## 2016-07-16 LAB — COMPLETE METABOLIC PANEL WITH GFR
ALBUMIN: 4.2 g/dL (ref 3.6–5.1)
ALT: 27 U/L (ref 9–46)
AST: 20 U/L (ref 10–35)
Alkaline Phosphatase: 65 U/L (ref 40–115)
BUN: 10 mg/dL (ref 7–25)
CHLORIDE: 105 mmol/L (ref 98–110)
CO2: 26 mmol/L (ref 20–31)
Calcium: 9.2 mg/dL (ref 8.6–10.3)
Creat: 0.87 mg/dL (ref 0.70–1.33)
GFR, Est African American: >89 mL/min (ref >=60)
GFR, Est Non African American: >89 mL/min (ref >=60)
Glucose, Bld: 100 mg/dL — ABNORMAL HIGH (ref 70–99)
POTASSIUM: 5 mmol/L (ref 3.5–5.3)
SODIUM: 140 mmol/L (ref 135–146)
TOTAL PROTEIN: 6.9 g/dL (ref 6.1–8.1)
Total Bilirubin: 0.9 mg/dL (ref 0.2–1.2)

## 2016-07-16 LAB — LIPID PANEL
Cholesterol: 197 mg/dL (ref 125–200)
HDL: 65 mg/dL (ref >=40)
LDL CALC: 120 mg/dL (ref <130)
Total CHOL/HDL Ratio: 3 Ratio (ref <=5.0)
Triglycerides: 62 mg/dL (ref <150)
VLDL: 12 mg/dL (ref <30)

## 2016-07-16 LAB — HEMOGLOBIN A1C
HEMOGLOBIN A1C: 5.5 % (ref <5.7)
MEAN PLASMA GLUCOSE: 111 mg/dL

## 2016-07-16 NOTE — Progress Notes (Signed)
Patient ID: Curtis Lucas MRN: [id number], DOB: 01-07-1961 55 y.o. Date of Encounter: 07/16/2016, 9:45 AM    Chief Complaint: Routine office visit and labs  HPI: 55 y.o. y/o male here for routine office visit and labs.   I did review the fact that some of his recent office visit notes were regarding visual disturbance. I reviewed the note by neurology that is in the computer. Patient reports that in addition to seeing neurologist he also saw a second ophthalmologist for another opinion/evaluation. He says that no one could ever come up with a definite diagnosis and he says that he is still having the symptoms.   Hypertension: He is taking medications as directed. He has no adverse effects. No lower extremity edema. No lightheadedness.  Hyperlipidemia: He is taking pravastatin 80 mg as directed. He has no myalgias and no other adverse effects.  Hyperglycemia: At prior office visit he told me he was eating lots of bread and also lots of diet [facility] use. He is still on drinking a diet [facility] but is not eating much bread.  GERD: He says that he only needs the Protonix as needed. He is no longer taking that on a daily basis.  Insomnia: He says he only needs a half of an Ambien but he does need to take this medicine every night otherwise cannot sleep.  No other complaints or concerns.    Review of Systems: Consitutional: No fever, chills, fatigue, night sweats, lymphadenopathy, or weight changes. Eyes: No  eye redness, or discharge. ENT/Mouth: Ears: No otalgia, tinnitus, hearing loss, discharge. Nose: No congestion, rhinorrhea, sinus pain, or epistaxis. Throat: No sore throat, post nasal drip, or teeth pain. Cardiovascular: No CP, palpitations, diaphoresis, DOE, edema, orthopnea, PND. Respiratory: No cough, hemoptysis, SOB, or wheezing. Gastrointestinal: No anorexia, dysphagia, reflux, pain, nausea, vomiting, hematemesis, diarrhea, constipation, BRBPR, or  melena. Genitourinary: No dysuria, frequency, urgency, hematuria, incontinence, nocturia, decreased urinary stream, discharge, impotence, or testicular pain/masses. Musculoskeletal: No decreased ROM, myalgias, stiffness, joint swelling, or weakness. Skin: No rash, erythema, lesion changes, pain, warmth, jaundice, or pruritis. Neurological: No headache, dizziness, syncope, seizures, tremors, memory loss, coordination problems, or paresthesias. Psychological: No anxiety, depression, hallucinations, SI/HI. Endocrine: No fatigue, polydipsia, polyphagia, polyuria, or known diabetes. All other systems were reviewed and are otherwise negative.  Past Medical History:  Diagnosis Date  . Allergy    Rhinitis  . Arthritis   . CVA (cerebral vascular accident) ([place])    WEAKNESS RT HAND/ POSITIONAL DOUBLE VISION   . Difficulty sleeping   . Elevated lipids   . GERD (gastroesophageal reflux disease)   . Hyperglycemia   . Hypertension   . Insomnia      Past Surgical History:  Procedure Laterality Date  . HEAD INJURY   1987  . LUMBAR EPIDURAL INJECTION    . RECTAL SURGERY  2006  . TOTAL HIP ARTHROPLASTY    . TOTAL HIP ARTHROPLASTY  05/03/2012   Procedure: TOTAL HIP ARTHROPLASTY ANTERIOR APPROACH;  Surgeon: Mauri Pole, MD;  Location: [facility];  Service: Orthopedics;  Laterality: Right;    Home Meds:  Outpatient Medications Prior to Visit  Medication Sig Dispense Refill  . amLODipine (NORVASC) 10 MG tablet TAKE 1 TABLET BY MOUTH EVERY DAY 90 tablet 1  . aspirin 81 MG tablet Take 81 mg by mouth daily.    [name] atenolol (TENORMIN) 100 MG tablet TAKE 1 TABLET BY MOUTH EVERY DAY 90 tablet 1  . Cholecalciferol (VITAMIN D) 2000  UNITS tablet Take 4,000 Units by mouth daily.    . meloxicam (MOBIC) 7.5 MG tablet Take 1 tablet (7.5 mg total) by mouth daily. Take with food. 30 tablet 2  . metaxalone (SKELAXIN) 800 MG tablet Take 1 tablet (800 mg total) by mouth 3 (three) times daily. 60 tablet 1  . Multiple  Vitamin (MULTIVITAMIN) tablet Take 1 tablet by mouth daily.    . pantoprazole (PROTONIX) 40 MG tablet TAKE 1 TABLET BY MOUTH EVERY DAY 90 tablet 1  . pravastatin (PRAVACHOL) 80 MG tablet TAKE 1 TABLET EVERY DAY 90 tablet 1  . zolpidem (AMBIEN) 10 MG tablet TAKE 1 TABLET BY MOUTH AT BEDTIME AS NEEDED 30 tablet 2   No facility-administered medications prior to visit.     Allergies:  Allergies  Allergen Reactions  . Lipitor [Atorvastatin Calcium]     Severe muscle spasms    Social History   Social History  . Marital status: Married    Spouse name: Jackelyn Poling  . Number of children: 0  . 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 Smoking status: Never Smoker  . Smokeless tobacco: Never Used  . Alcohol use Yes     Comment: 4-5 BEERS PER WK  . Drug use: No  . Sexual activity: Yes   Other Topics Concern  . Not on file   Social History Narrative   Patient lives at home with spouse.   Caffeine Use:4 16oz Mt. Dew sodas daily    Family History  Problem Relation Age of Onset  . Stroke Mother   . Cancer Mother     colon  . Dementia Mother   . Parkinson's disease Father   . Heart attack Paternal Grandfather   . Dementia Paternal Grandmother     Physical Exam: Blood pressure 112/70, pulse 68, temperature 98.9 F (37.2 C), temperature source Oral, resp. rate 16, height 6\' 1"  (1.854 m), weight 184 lb (83.5 kg).  General: Well developed, well nourished,WM. Appears in no acute distress. Neck: Supple. Trachea midline. No thyromegaly. Full ROM. No lymphadenopathy. Lungs: Clear to auscultation bilaterally without wheezes, rales, or rhonchi. Breathing is of normal effort and unlabored. Cardiovascular: RRR with S1 S2. No murmurs, rubs, or gallops. Distal pulses 2+ symmetrically. No carotid or abdominal bruits. Abdomen: Soft, non-tender, non-distended with normoactive bowel sounds. No hepatosplenomegaly or  masses. No rebound/guarding. No CVA tenderness. No hernias. Musculoskeletal: Full range of motion and 5/5 strength throughout.  Skin: Warm and moist without erythema, ecchymosis, wounds, or rash. Neuro: A+Ox3. CN II-XII grossly intact. Moves all extremities spontaneously. Full sensation throughout. Normal gait.  Psych:  Responds to questions appropriately with a normal affect.   Assessment/Plan:  55 y.o. y/o  [place] male here for   Screening for colorectal cancer At visit 07/16/16 he reports that he never did go see GI or have his colonoscopy. Says that his wife did finally have her screening colonoscopy and he needs to have his done. Wants me to go ahead and order another referral. Says that he will go to his appointment this time and that his work schedule is not overly busy right now and that he will be able to go to the appointment now. - Ambulatory referral to Gastroenterology    Elevated lipids On pravastatin 80 mg. [place] lab. - COMPLETE METABOLIC PANEL WITH GFR - Lipid panel  Hypertension Blood pressure is at goal. Continue  current medications and check labs monitor. - COMPLETE METABOLIC PANEL WITH GFR   Vitamin D deficiency Lab 05/02/15---Vitiman D level was low at 17----was treated with repletion.---Will recheck level now. - VITAMIN D 25 Hydroxy (Vit-D Deficiency, Fractures)   GERD (gastroesophageal reflux disease) Continue to use PPI when necessary.  Insomnia Continue taking one half of an Ambien each bedtime when necessary  Hyperglycemia This has been managed with diet changes. Will continue to check lab to monitor. - Hemoglobin A1c  CVA (cerebral vascular accident)-- History of occluded left ICA/left CT A. secondary to trauma from an accident  S/P right THA, AA-- He had a left total hip replacement in May 2012 and right hip total replacement May 2013. As well he has had lumbar epidural injections in February 2012. Today he states that he's been having no pain  whatsoever" feels like he is 55 years old ". He requires no type of medications for pain whatsoever finally.  Visual disturbance See HPI.   Has had evaluation with neurologist and ophthalmologist and no etiology could be determined. Pt reports the symptoms persist.     THE FOLLOWING IS COPIED FROM CPE 05/02/2015: -1. Visit for preventive health examination  A. Screening Labs: - CBC with Differential/Platelet - COMPLETE METABOLIC PANEL WITH GFR - Lipid panel - PSA - Vit D  25 hydroxy (rtn osteoporosis monitoring) - TSH   B. Screening For Prostate Cancer: - PSA  C.  Screening for colorectal cancer He had screening colonoscopy 09/24/2003. This was normal. Was due to repeat 09/2013. At [facility] with me around 09/2013--we discussed but he deferred.   In the past patient had deferred colonoscopy but today he is agreeable for me to go ahead and refer him to GI to have this performed. - Ambulatory referral to Gastroenterology  ADDENDUM--Added 01/15/2016:  At [place] 01/15/16 asked patient if he followed up with colonoscopy. He says that at the time of his appointment was scheduled he was having to work a lot of overtime at work so was unable to go to that appointment. Says things have continued to be very busy at work. Says currently one employee is out with hip surgery. Says that when he things calm down at work and schedule permits he will call me and I will place another order for this.  At visit 07/16/16 he reports that he never did go see GI or have his colonoscopy. Is that his wife did finally have her screening colonoscopy and he needs to have his done. Wants me to go ahead and order another referral. Says that he will go to his appointment this time and that his work schedule is not overly busy right now and that he will be able to go to the appointment now. - Ambulatory referral to Gastroenterology  D. Immunizations: Flu-----------------------N/A Tetanus----------------he received T dap  04/20/2014 Pneumococcal-------he has no indication to need pneumonia vaccine until age 63 Zostavax-------------- will discuss at age 56    Routine office visit in 6 months or sooner as needed.     Signed:   [address] [place], [place]  07/16/2016 9:45 AM

## 2016-07-17 LAB — VITAMIN D 25 HYDROXY (VIT D DEFICIENCY, FRACTURES): Vit D, 25-Hydroxy: 43 ng/mL (ref 30–100)

## 2016-07-22 ENCOUNTER — Encounter: Payer: Self-pay | Admitting: Family Medicine

## 2016-07-27 ENCOUNTER — Other Ambulatory Visit: Payer: Self-pay | Admitting: Family Medicine

## 2016-07-27 DIAGNOSIS — M545 Low back pain, unspecified: Secondary | ICD-10-CM

## 2016-07-27 MED ORDER — MELOXICAM 7.5 MG PO TABS: 7.5000 mg | ORAL_TABLET | Freq: Every day | ORAL | 1 refills | Status: DC

## 2016-07-27 NOTE — Telephone Encounter (Signed)
Medication refilled per protocol. 

## 2016-07-28 ENCOUNTER — Encounter: Payer: Self-pay | Admitting: Gastroenterology

## 2016-07-29 ENCOUNTER — Other Ambulatory Visit: Payer: Self-pay | Admitting: Physician Assistant

## 2016-07-30 NOTE — Telephone Encounter (Signed)
Approved. #30+2. 

## 2016-07-30 NOTE — Telephone Encounter (Signed)
LRF 04/30/16 #30 + 2  LOV 07/16/16  OK refill?

## 2016-07-30 NOTE — Telephone Encounter (Signed)
Rx called in 

## 2016-09-10 ENCOUNTER — Other Ambulatory Visit: Payer: Self-pay | Admitting: Physician Assistant

## 2016-09-14 ENCOUNTER — Ambulatory Visit (AMBULATORY_SURGERY_CENTER): Payer: Self-pay | Admitting: *Deleted

## 2016-09-14 VITALS — Ht 70.0 in | Wt 190.0 lb

## 2016-09-14 DIAGNOSIS — Z800 Family history of malignant neoplasm of digestive organs: Secondary | ICD-10-CM

## 2016-09-14 MED ORDER — NA SULFATE-K SULFATE-MG SULF 17.5-3.13-1.6 GM/177ML PO SOLN: 1.0000 | Freq: Once | ORAL | 0 refills | Status: AC

## 2016-09-14 NOTE — Progress Notes (Signed)
No egg or soy allergy known to patient  No issues with past sedation with any surgeries  or procedures, no intubation problems  No diet pills per patient No home 02 use per patient  No blood thinners per patient  Pt denies issues with constipation  No A fib or A flutter  emmi video declined   

## 2016-09-22 ENCOUNTER — Encounter: Payer: Self-pay | Admitting: Gastroenterology

## 2016-09-22 ENCOUNTER — Other Ambulatory Visit: Payer: Self-pay | Admitting: Physician Assistant

## 2016-09-22 NOTE — Telephone Encounter (Signed)
RX refilled per protocol 

## 2016-09-26 ENCOUNTER — Other Ambulatory Visit: Payer: Self-pay | Admitting: Physician Assistant

## 2016-09-28 ENCOUNTER — Ambulatory Visit (AMBULATORY_SURGERY_CENTER): Payer: BLUE CROSS/BLUE SHIELD | Admitting: Gastroenterology

## 2016-09-28 ENCOUNTER — Encounter: Payer: Self-pay | Admitting: Gastroenterology

## 2016-09-28 VITALS — BP 138/67 | HR 63 | Temp 98.0°F | Resp 38 | Ht 70.0 in | Wt 190.0 lb

## 2016-09-28 DIAGNOSIS — Z1211 Encounter for screening for malignant neoplasm of colon: Secondary | ICD-10-CM

## 2016-09-28 DIAGNOSIS — Z800 Family history of malignant neoplasm of digestive organs: Secondary | ICD-10-CM | POA: Diagnosis not present

## 2016-09-28 DIAGNOSIS — Z1212 Encounter for screening for malignant neoplasm of rectum: Secondary | ICD-10-CM | POA: Diagnosis not present

## 2016-09-28 DIAGNOSIS — D123 Benign neoplasm of transverse colon: Secondary | ICD-10-CM

## 2016-09-28 MED ORDER — SODIUM CHLORIDE 0.9 % IV SOLN: 500.0000 mL | INTRAVENOUS | Status: DC

## 2016-09-28 NOTE — Progress Notes (Signed)
Called to room to assist during endoscopic procedure.  Patient ID and intended procedure confirmed with present staff. Received instructions for my participation in the procedure from the performing physician.  

## 2016-09-28 NOTE — Progress Notes (Signed)
Report to PACU, RN, vss, BBS= Clear.  

## 2016-09-28 NOTE — Op Note (Signed)
[facility] Patient Name: Curtis Lucas Procedure Date: 09/28/2016 10:28 AM MRN: [id number] Endoscopist: Farr West. Loletha Carrow , MD Age: 55 Referring MD:  Date of Birth: [birth year] Gender: Male Account #: [id number] Procedure:                Colonoscopy Indications:              Screening in patient at increased risk: Colorectal                            cancer in mother 33 or older Medicines:                Monitored Anesthesia Care Procedure:                Pre-Anesthesia Assessment:                           - Prior to the procedure, a History and Physical                            was performed, and patient medications and                            allergies were reviewed. The patient's tolerance of                            previous anesthesia was also reviewed. The risks                            and benefits of the procedure and the sedation                            options and risks were discussed with the patient.                            All questions were answered, and informed consent                            was obtained. Prior Anticoagulants: The patient has                            taken no previous anticoagulant or antiplatelet                            agents. ASA Grade Assessment: II - A patient with                            mild systemic disease. After reviewing the risks                            and benefits, the patient was deemed in                            satisfactory condition to undergo the procedure.  After obtaining informed consent, the colonoscope                            was passed under direct vision. Throughout the                            procedure, the patient's blood pressure, pulse, and                            oxygen saturations were monitored continuously. The                            Model CF-HQ190L [id number]) scope was introduced                            through the anus and advanced  to the the cecum,                            identified by appendiceal orifice and ileocecal                            valve. The colonoscopy was performed without                            difficulty. The patient tolerated the procedure                            well. The quality of the bowel preparation was                            excellent. The ileocecal valve, appendiceal                            orifice, and rectum were photographed. The quality                            of the bowel preparation was evaluated using the                            BBPS [facility] Bowel Preparation Scale) with scores                            of: Right Colon = 2, Transverse Colon = 3 and Left                            Colon = 3. The total BBPS score equals 8. The bowel                            preparation used was SUPREP. Scope In: 10:45:19 AM Scope Out: 10:59:13 AM Scope Withdrawal Time: 0 hours 10 minutes 40 seconds  Total Procedure Duration: 0 hours 13 minutes 54 seconds  Findings:                 The perianal  and digital rectal examinations were                            normal.                           Diverticula were found in the left colon.                           A 4 mm polyp was found in the proximal transverse                            colon. The polyp was sessile. The polyp was removed                            with a cold snare. Resection and retrieval were                            complete.                           The exam was otherwise without abnormality on                            direct and retroflexion views. Complications:            No immediate complications. Estimated Blood Loss:     Estimated blood loss: none. Impression:               - Diverticulosis in the left colon.                           - One 4 mm polyp in the proximal transverse colon,                            removed with a cold snare. Resected and retrieved.                           - The  examination was otherwise normal on direct                            and retroflexion views. Recommendation:           - Patient has a contact number available for                            emergencies. The signs and symptoms of potential                            delayed complications were discussed with the                            patient. Return to normal activities tomorrow.                            Written discharge instructions were provided to  the                            patient.                           - Resume previous diet.                           - Continue present medications.                           - Await pathology results.                           - Repeat colonoscopy in 5 years for surveillance. [name], MD 09/28/2016 11:04:28 AM This report has been signed electronically.

## 2016-09-29 ENCOUNTER — Telehealth: Payer: Self-pay

## 2016-09-29 NOTE — Telephone Encounter (Signed)
  Follow up Call-  Call back number 09/28/2016  Post procedure Call Back phone  # [phone]  Permission to leave phone message Yes  Some recent data might be hidden     Patient questions:  Do you have a fever, pain , or abdominal swelling? No. Pain Score  0 *  Have you tolerated food without any problems? Yes.    Have you been able to return to your normal activities? Yes.    Do you have any questions about your discharge instructions: Diet   No. Medications  No. Follow up visit  No.  Do you have questions or concerns about your Care? No.  Actions: * If pain score is 4 or above: No action needed, pain <4.  No problems per the pt. maw

## 2016-09-29 NOTE — Telephone Encounter (Signed)
Medication refilled per protocol. 

## 2016-10-02 ENCOUNTER — Encounter: Payer: Self-pay | Admitting: Gastroenterology

## 2016-10-07 ENCOUNTER — Ambulatory Visit (INDEPENDENT_AMBULATORY_CARE_PROVIDER_SITE_OTHER): Payer: Commercial Managed Care - HMO | Admitting: Physician Assistant

## 2016-10-07 ENCOUNTER — Encounter: Payer: Self-pay | Admitting: Physician Assistant

## 2016-10-07 VITALS — BP 130/86 | HR 64 | Temp 98.1°F | Resp 16 | Wt 185.0 lb

## 2016-10-07 DIAGNOSIS — R059 Cough, unspecified: Secondary | ICD-10-CM

## 2016-10-07 DIAGNOSIS — R05 Cough: Secondary | ICD-10-CM

## 2016-10-07 DIAGNOSIS — K219 Gastro-esophageal reflux disease without esophagitis: Secondary | ICD-10-CM | POA: Diagnosis not present

## 2016-10-07 MED ORDER — ESOMEPRAZOLE MAGNESIUM 40 MG PO CPDR: 40.0000 mg | DELAYED_RELEASE_CAPSULE | Freq: Every day | ORAL | 3 refills | Status: DC

## 2016-10-07 MED ORDER — AZITHROMYCIN 250 MG PO TABS
ORAL_TABLET | ORAL | 0 refills | Status: DC
Start: 2016-10-07 — End: 2016-12-04

## 2016-10-07 NOTE — Progress Notes (Signed)
Patient ID: [name] MRN: [id number], DOB: [birth year], 55 y.o. Date of Encounter: 10/07/2016, 4:58 PM    Chief Complaint:  Chief Complaint  Patient presents with  . Cough    x 4 weeks, mucus     HPI: 55 y.o. year old male presents presents for f/u regarding cough.   Says that he has gone to an urgent care twice recently secondary to cough. Says that he saw a different doctor the 2 times that he went. Says that the first time he went he was treated with oral prednisone and given prescription for benzonatate. Says that the cough did not resolve so he returned back to that urgent care 1 week later.  He does have a copy of that AVS. At that visit he was treated with Depo-Medrol 40 mg and they also have documented chest x-ray and spirometry. That second visit was dated 10/03/16. Says that that doctor there at the urgent care looked at his chest x-ray and said that " the chest x-ray was abnormal and looks like fluid at the top of his lungs or something like that"--- these are patient's words. He says that it was very confusing. Says the doctor did note that they would have the x-ray read by radiologist. Patient's wife later called back to the urgent care wanting to get the radiology report on the chest x-ray and they said that they could give no results over the phone. Even though pt had signed that she was able to get patient's results they would not give results over the phone. Meanwhile at that second visit at the urgent care that doctor had made a referral to a pulmonologist. Pt states that that pulmonologist is located in [facility] and patient does not live in [place]. He prefers to see a Technical brewer in [place].   Patient states that he has also been taking medication for acid reflux. Actually since this cough has been occurring has been taking over-the-counter Nexium in addition to his Protonix. In the past he has felt that Nexium works better for him and he is wanting me to stop the  Protonix and change to Nexium.  He has continued to have some cough.  Reviewed that he has not been given any antibiotics at either urgent care visit and patient and wife confirm that no he has been on no antibiotics for this.     Home Meds:   Outpatient Medications Prior to Visit  Medication Sig Dispense Refill  . amLODipine (NORVASC) 10 MG tablet TAKE 1 TABLET BY MOUTH EVERY DAY 90 tablet 1  . aspirin 81 MG tablet Take 81 mg by mouth daily.    [name] atenolol (TENORMIN) 100 MG tablet TAKE 1 TABLET BY MOUTH EVERY DAY 90 tablet 1  . Cholecalciferol (VITAMIN D) 2000 UNITS tablet Take 4,000 Units by mouth daily.    . meloxicam (MOBIC) 7.5 MG tablet Take 1 tablet (7.5 mg total) by mouth daily. Take with food. 90 tablet 1  . Omega-3 Fatty Acids (FISH OIL) 1200 MG CAPS Take 1 capsule by mouth daily.    . pravastatin (PRAVACHOL) 80 MG tablet TAKE 1 TABLET EVERY DAY 90 tablet 1  . zolpidem (AMBIEN) 10 MG tablet TAKE 1 TABLET BY MOUTH AT BEDTIME AS NEEDED FOR INSOMNIA 30 tablet 2  . benzonatate (TESSALON) 200 MG capsule   0  . pantoprazole (PROTONIX) 40 MG tablet TAKE 1 TABLET BY MOUTH EVERY DAY 90 tablet 1  . metaxalone (SKELAXIN) 800 MG tablet  Take 1 tablet (800 mg total) by mouth 3 (three) times daily. (Patient not taking: Reported on 10/07/2016) 60 tablet 1  . Multiple Vitamin (MULTIVITAMIN) tablet Take 1 tablet by mouth daily.    . predniSONE (STERAPRED UNI-PAK 21 TAB) 10 MG (21) TBPK tablet   0   Facility-Administered Medications Prior to Visit  Medication Dose Route Frequency Provider Last Rate Last Dose  . 0.9 %  sodium chloride infusion  500 mL Intravenous Continuous Nelida Meuse III, MD        Allergies:  Allergies  Allergen Reactions  . Lipitor [Atorvastatin Calcium]     Severe muscle spasms      Review of Systems: See HPI for pertinent ROS. All other ROS negative.    Physical Exam: Blood pressure 130/86, pulse 64, temperature 98.1 F (36.7 C), temperature source Oral,  resp. rate 16, weight 185 lb (83.9 kg), SpO2 98 %., Body mass index is 26.54 kg/m. General:  WNWD WM. Appears in no acute distress. HEENT: Normocephalic, atraumatic, eyes without discharge, sclera non-icteric, nares are without discharge. Bilateral auditory canals clear, TM's are without perforation, pearly grey and translucent with reflective cone of light bilaterally. Oral cavity moist, posterior pharynx without exudate, erythema, peritonsillar abscess, or post nasal drip.  Neck: Supple. No thyromegaly. No lymphadenopathy. Lungs: Clear bilaterally to auscultation without wheezes, rales, or rhonchi. Breathing is unlabored. Heart: Regular rhythm. No murmurs, rubs, or gallops. Msk:  Strength and tone normal for age. Extremities/Skin: Warm and dry. Neuro: Alert and oriented X 3. Moves all extremities spontaneously. Gait is normal. CNII-XII grossly in tact. Psych:  Responds to questions appropriately with a normal affect.     ASSESSMENT AND PLAN:  55 y.o. year old male with  1. Cough Will add an antibiotic. Will refer him to a pulmonologist in [place]. - azithromycin (ZITHROMAX) 250 MG tablet; Day 1: Take 2 daily.  Days 2-5: Take 1 daily.  Dispense: 6 tablet; Refill: 0 - Ambulatory referral to Pulmonology  2. Gastroesophageal reflux disease, esophagitis presence not specified Stop the Protonix and change to Nexium. - esomeprazole (NEXIUM) 40 MG capsule; Take 1 capsule (40 mg total) by mouth daily.  Dispense: 30 capsule; Refill: 3   Signed, [address] [place], [place], [place] 10/07/2016 4:58 PM

## 2016-10-13 ENCOUNTER — Telehealth: Payer: Self-pay | Admitting: Physician Assistant

## 2016-10-13 NOTE — Telephone Encounter (Signed)
Debbie stopped by and states that patient has taken the remainder of his antibiotic she states that the cough is still there it is better than it was however he is requesting another round of antibiotic. Please advise he uses [facility] on [address]  CB# [phone]

## 2016-10-13 NOTE — Telephone Encounter (Signed)
PLS see note below  Offered to hve pt come in for a recheck Last OV 10-18 pls advise

## 2016-10-14 NOTE — Telephone Encounter (Signed)
At [place] I ordered referral to Pulmonary.  Talk with Maudie Mercury to see if she can go ahead and get this scheduled.  Tell pt to f/u with Pulmonary

## 2016-10-15 NOTE — Telephone Encounter (Signed)
Spoke with Raymel Vandongen explained that Olean Ree would like to have Ryne f/u with pulmonary.   Jackelyn Poling stated she is not sure if Rylee would go to the pulmonary because his cough was better, Debbie asked to have the pulmonary call her phone because it is hard to reach [place].

## 2016-10-25 ENCOUNTER — Other Ambulatory Visit: Payer: Self-pay | Admitting: Physician Assistant

## 2016-10-26 NOTE — Telephone Encounter (Signed)
Last OV 10-18 Last refill  8-10 Okay to refil?

## 2016-10-26 NOTE — Telephone Encounter (Signed)
Approved. #[id number]. 

## 2016-10-27 NOTE — Telephone Encounter (Signed)
RX filled and called in

## 2016-12-04 ENCOUNTER — Encounter: Payer: Self-pay | Admitting: Family Medicine

## 2016-12-04 ENCOUNTER — Ambulatory Visit (INDEPENDENT_AMBULATORY_CARE_PROVIDER_SITE_OTHER): Payer: 59 | Admitting: Family Medicine

## 2016-12-04 VITALS — BP 120/70 | HR 78 | Temp 99.6°F | Resp 16 | Ht 70.0 in | Wt 192.0 lb

## 2016-12-04 DIAGNOSIS — F411 Generalized anxiety disorder: Secondary | ICD-10-CM

## 2016-12-04 DIAGNOSIS — F429 Obsessive-compulsive disorder, unspecified: Secondary | ICD-10-CM | POA: Diagnosis not present

## 2016-12-04 MED ORDER — ESCITALOPRAM OXALATE 10 MG PO TABS: 10.0000 mg | ORAL_TABLET | Freq: Every day | ORAL | 5 refills | Status: DC

## 2016-12-04 NOTE — Progress Notes (Signed)
Subjective:    Patient ID: Curtis Lucas, male    DOB: [birth year], 55 y.o.   MRN: [id number]  HPI Patient is a 55 year old white male who has a history of sessile compulsive disorder self diagnosed. He states that he worries incessantly about things. He will check them 3 or 4 times to reassure himself that they have been done. If he does not check them 3 or 4 times, it will trigger a panic attack. He also reports daily anxiety that is "driving him crazy". He will suddenly have a difficult time breathing. He also at the room is closing in on him. This can last minutes to hours. He also reports insomnia. He reports perseverating over things at night keeping him awake. He reports intrusive thoughts. He denies any mania. He denies any impulsivity. In the past he took Lexapro and symptoms improved dramatically. He has no documented history of bipolar disorder. Past Medical History:  Diagnosis Date  . Allergy    Rhinitis  . Arthritis   . CVA (cerebral vascular accident) ([place])    WEAKNESS RT HAND/ POSITIONAL DOUBLE VISION   . Difficulty sleeping   . Elevated lipids   . GERD (gastroesophageal reflux disease)   . Hyperglycemia   . Hyperlipidemia   . Hypertension   . Insomnia    Past Surgical History:  Procedure Laterality Date  . COLONOSCOPY  09/24/2003   patterson--normal per pt.  [name] HEAD INJURY   1987  . LUMBAR EPIDURAL INJECTION    . RECTAL SURGERY  2006  . TOTAL HIP ARTHROPLASTY    . TOTAL HIP ARTHROPLASTY  05/03/2012   Procedure: TOTAL HIP ARTHROPLASTY ANTERIOR APPROACH;  Surgeon: Mauri Pole, MD;  Location: [facility];  Service: Orthopedics;  Laterality: Right;   Current Outpatient Prescriptions on File Prior to Visit  Medication Sig Dispense Refill  . amLODipine (NORVASC) 10 MG tablet TAKE 1 TABLET BY MOUTH EVERY DAY 90 tablet 1  . aspirin 81 MG tablet Take 81 mg by mouth daily.    [name] atenolol (TENORMIN) 100 MG tablet TAKE 1 TABLET BY MOUTH EVERY DAY 90 tablet 1  .  Cholecalciferol (VITAMIN D) 2000 UNITS tablet Take 4,000 Units by mouth daily.    [name] esomeprazole (NEXIUM) 40 MG capsule Take 1 capsule (40 mg total) by mouth daily. 30 capsule 3  . meloxicam (MOBIC) 7.5 MG tablet Take 1 tablet (7.5 mg total) by mouth daily. Take with food. 90 tablet 1  . Omega-3 Fatty Acids (FISH OIL) 1200 MG CAPS Take 1 capsule by mouth daily.    . pravastatin (PRAVACHOL) 80 MG tablet TAKE 1 TABLET EVERY DAY 90 tablet 1  . zolpidem (AMBIEN) 10 MG tablet TAKE 1 TABLET BY MOUTH AT BEDTIME AS NEEDED 30 tablet 3  . metaxalone (SKELAXIN) 800 MG tablet Take 1 tablet (800 mg total) by mouth 3 (three) times daily. (Patient not taking: Reported on 12/04/2016) 60 tablet 1   Current Facility-Administered Medications on File Prior to Visit  Medication Dose Route Frequency Provider Last Rate Last Dose  . 0.9 %  sodium chloride infusion  500 mL Intravenous Continuous Nelida Meuse III, MD       Allergies  Allergen Reactions  . Lipitor [Atorvastatin Calcium]     Severe muscle spasms      Review of Systems     Objective:   Physical Exam  Cardiovascular: Normal rate, regular rhythm and normal heart sounds.   Pulmonary/Chest: Effort normal and breath sounds normal.  Psychiatric: He has a normal mood and affect. His behavior is normal. Judgment and thought content normal.          Assessment & Plan:  GAD (generalized anxiety disorder) - Plan: escitalopram (LEXAPRO) 10 MG tablet  Obsessive-compulsive disorder, unspecified type  Begin Lexapro 10 mg a day and recheck in 4 weeks or sooner if worse

## 2016-12-27 ENCOUNTER — Other Ambulatory Visit: Payer: Self-pay | Admitting: Family Medicine

## 2016-12-31 ENCOUNTER — Other Ambulatory Visit: Payer: Self-pay | Admitting: Physician Assistant

## 2016-12-31 DIAGNOSIS — M545 Low back pain, unspecified: Secondary | ICD-10-CM

## 2016-12-31 NOTE — Telephone Encounter (Signed)
rx filled per protocol  

## 2017-01-20 ENCOUNTER — Ambulatory Visit: Payer: BLUE CROSS/BLUE SHIELD | Admitting: Physician Assistant

## 2017-01-20 ENCOUNTER — Encounter: Payer: Self-pay | Admitting: Physician Assistant

## 2017-01-20 ENCOUNTER — Ambulatory Visit (INDEPENDENT_AMBULATORY_CARE_PROVIDER_SITE_OTHER): Payer: 59 | Admitting: Physician Assistant

## 2017-01-20 VITALS — BP 110/68 | HR 60 | Temp 98.0°F | Resp 18 | Wt 194.0 lb

## 2017-01-20 DIAGNOSIS — Z125 Encounter for screening for malignant neoplasm of prostate: Secondary | ICD-10-CM | POA: Insufficient documentation

## 2017-01-20 DIAGNOSIS — F411 Generalized anxiety disorder: Secondary | ICD-10-CM

## 2017-01-20 DIAGNOSIS — E785 Hyperlipidemia, unspecified: Secondary | ICD-10-CM

## 2017-01-20 DIAGNOSIS — I10 Essential (primary) hypertension: Secondary | ICD-10-CM | POA: Diagnosis not present

## 2017-01-20 DIAGNOSIS — R739 Hyperglycemia, unspecified: Secondary | ICD-10-CM | POA: Diagnosis not present

## 2017-01-20 DIAGNOSIS — K219 Gastro-esophageal reflux disease without esophagitis: Secondary | ICD-10-CM

## 2017-01-21 LAB — COMPLETE METABOLIC PANEL WITH GFR
ALT: 23 U/L (ref 9–46)
AST: 17 U/L (ref 10–35)
Albumin: 4.1 g/dL (ref 3.6–5.1)
Alkaline Phosphatase: 64 U/L (ref 40–115)
BUN: 12 mg/dL (ref 7–25)
CHLORIDE: 102 mmol/L (ref 98–110)
CO2: 28 mmol/L (ref 20–31)
Calcium: 9.2 mg/dL (ref 8.6–10.3)
Creat: 0.83 mg/dL (ref 0.70–1.33)
GFR, Est African American: >89 mL/min (ref >=60)
GLUCOSE: 87 mg/dL (ref 70–99)
POTASSIUM: 4.2 mmol/L (ref 3.5–5.3)
SODIUM: 138 mmol/L (ref 135–146)
Total Bilirubin: 0.6 mg/dL (ref 0.2–1.2)
Total Protein: 6.8 g/dL (ref 6.1–8.1)

## 2017-01-21 LAB — PSA: PSA: 0.4 ng/mL (ref <=4.0)

## 2017-01-21 MED ORDER — HYDROCODONE-ACETAMINOPHEN 5-325 MG PO TABS: 1.0000 | ORAL_TABLET | Freq: Four times a day (QID) | ORAL | 0 refills | Status: DC | PRN

## 2017-01-21 NOTE — Progress Notes (Signed)
Patient ID: Curtis Lucas MRN: [id number], DOB: Apr 16, 1961 56 y.o. Date of Encounter: 01/21/2017, 7:25 AM    Chief Complaint: Routine office visit and labs  HPI: 56 y.o. y/o male here for routine office visit and labs.   I did review the fact that some of his recent office visit notes were regarding visual disturbance. I reviewed the note by neurology that is in the computer. Patient reports that in addition to seeing neurologist he also saw a second ophthalmologist for another opinion/evaluation. He says that no one could ever come up with a definite diagnosis and he says that he is still having the symptoms.  01/20/2017: He had OV with Dr. Dennard Schaumann 12/04/16. At that visit he reported that he worries incessantly about things. Will check things 3-4 times to reassure himself that they had been done. If he did not check them 3 or 4 times this would trigger a panic attack. Reported daily anxiety that was "driving him crazy ". Would then have a difficult time breathing. Would feel that the room was closing in on him. Also reported insomnia. Would lie awake thinking over things at night keeping him awake. He reported that in the past he has taken Lexapro and symptoms improved dramatically. At that visit was started on Lexapro 10 mg daily.  He reports that the Lexapro is working well. Says that his anxiety is much better and is well controlled now.  Today he reports that he hurt his right arm at work last week. Says there was a box that weighed over 100 pounds on a pallet. Was trying to tilt it up. Suddenly heard a loud pop. Since then has been having pain around the brachial aspect of the elbow region and also pain up towards the right shoulder.  No other complaints or concerns.   Hypertension: He is taking medications as directed. He has no adverse effects. No lower extremity edema. No lightheadedness.  Hyperlipidemia: He is taking pravastatin 80 mg as directed. He has no myalgias and no  other adverse effects.  Hyperglycemia: At prior office visit he told me he was eating lots of bread and also lots of diet [facility] use. He is still on drinking a diet [facility] but is not eating much bread.  GERD: He says that he only needs the Protonix as needed. He is no longer taking that on a daily basis.  Insomnia: He says he only needs a half of an Ambien but he does need to take this medicine every night otherwise cannot sleep.  No other complaints or concerns.    Review of Systems: Consitutional: No fever, chills, fatigue, night sweats, lymphadenopathy, or weight changes. Eyes: No  eye redness, or discharge. ENT/Mouth: Ears: No otalgia, tinnitus, hearing loss, discharge. Nose: No congestion, rhinorrhea, sinus pain, or epistaxis. Throat: No sore throat, post nasal drip, or teeth pain. Cardiovascular: No CP, palpitations, diaphoresis, DOE, edema, orthopnea, PND. Respiratory: No cough, hemoptysis, SOB, or wheezing. Gastrointestinal: No anorexia, dysphagia, reflux, pain, nausea, vomiting, hematemesis, diarrhea, constipation, BRBPR, or melena. Genitourinary: No dysuria, frequency, urgency, hematuria, incontinence, nocturia, decreased urinary stream, discharge, impotence, or testicular pain/masses. Musculoskeletal: No decreased ROM, myalgias, stiffness, joint swelling, or weakness. Skin: No rash, erythema, lesion changes, pain, warmth, jaundice, or pruritis. Neurological: No headache, dizziness, syncope, seizures, tremors, memory loss, coordination problems, or paresthesias. Psychological: No anxiety, depression, hallucinations, SI/HI. Endocrine: No fatigue, polydipsia, polyphagia, polyuria, or known diabetes. All other systems were reviewed and are otherwise negative.  Past Medical  History:  Diagnosis Date  . Allergy    Rhinitis  . Arthritis   . CVA (cerebral vascular accident) ([place])    WEAKNESS RT HAND/ POSITIONAL DOUBLE VISION   . Difficulty sleeping   . Elevated  lipids   . GERD (gastroesophageal reflux disease)   . Hyperglycemia   . Hyperlipidemia   . Hypertension   . Insomnia      Past Surgical History:  Procedure Laterality Date  . COLONOSCOPY  09/24/2003   patterson--normal per pt.  [name] HEAD INJURY   1987  . LUMBAR EPIDURAL INJECTION    . RECTAL SURGERY  2006  . TOTAL HIP ARTHROPLASTY    . TOTAL HIP ARTHROPLASTY  05/03/2012   Procedure: TOTAL HIP ARTHROPLASTY ANTERIOR APPROACH;  Surgeon: Mauri Pole, MD;  Location: [facility];  Service: Orthopedics;  Laterality: Right;    Home Meds:  Outpatient Medications Prior to Visit  Medication Sig Dispense Refill  . amLODipine (NORVASC) 10 MG tablet TAKE 1 TABLET EVERY DAY 90 tablet 1  . aspirin 81 MG tablet Take 81 mg by mouth daily.    [name] atenolol (TENORMIN) 100 MG tablet TAKE 1 TABLET BY MOUTH EVERY DAY 90 tablet 1  . Cholecalciferol (VITAMIN D) 2000 UNITS tablet Take 4,000 Units by mouth daily.    [name] escitalopram (LEXAPRO) 10 MG tablet Take 1 tablet (10 mg total) by mouth daily. 30 tablet 5  . esomeprazole (NEXIUM) 40 MG capsule Take 1 capsule (40 mg total) by mouth daily. 30 capsule 3  . meloxicam (MOBIC) 7.5 MG tablet TAKE 1 TABLET BY MOUTH EVERY DAY WITH FOOD 90 tablet 0  . metaxalone (SKELAXIN) 800 MG tablet Take 1 tablet (800 mg total) by mouth 3 (three) times daily. 60 tablet 1  . Omega-3 Fatty Acids (FISH OIL) 1200 MG CAPS Take 1 capsule by mouth daily.    . pravastatin (PRAVACHOL) 80 MG tablet TAKE 1 TABLET EVERY DAY 90 tablet 1  . zolpidem (AMBIEN) 10 MG tablet TAKE 1 TABLET BY MOUTH AT BEDTIME AS NEEDED 30 tablet 3   Facility-Administered Medications Prior to Visit  Medication Dose Route Frequency Provider Last Rate Last Dose  . 0.9 %  sodium chloride infusion  500 mL Intravenous Continuous Nelida Meuse III, MD        Allergies:  Allergies  Allergen Reactions  . Lipitor [Atorvastatin Calcium]     Severe muscle spasms    Social History   Social History  . Marital status:  Married    Spouse name: Jackelyn Poling  . Number of children: 0  . 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 Smoking status: Never Smoker  . Smokeless tobacco: Never Used  . Alcohol use Yes     Comment: 4-5 BEERS PER WK  . Drug use: No  . Sexual activity: Yes   Other Topics Concern  . Not on file   Social History Narrative   Patient lives at home with spouse.   Caffeine Use:4 16oz Mt. Dew sodas daily    Family History  Problem Relation Age of Onset  . Stroke Mother   . Cancer Mother     colon  . Dementia Mother   . Colon cancer Mother   . Parkinson's disease Father   . Heart attack Paternal Grandfather   . Dementia Paternal Grandmother   . Colon polyps Neg Hx   .  Esophageal cancer Neg Hx   . Rectal cancer Neg Hx   . Stomach cancer Neg Hx     Physical Exam: Blood pressure 110/68, pulse 60, temperature 98 F (36.7 C), temperature source Oral, resp. rate 18, weight 194 lb (88 kg), SpO2 98 %.  General: Well developed, well nourished,WM. Appears in no acute distress. Neck: Supple. Trachea midline. No thyromegaly. Full ROM. No lymphadenopathy. Lungs: Clear to auscultation bilaterally without wheezes, rales, or rhonchi. Breathing is of normal effort and unlabored. Cardiovascular: RRR with S1 S2. No murmurs, rubs, or gallops. Distal pulses 2+ symmetrically. No carotid or abdominal bruits. Abdomen: Soft, non-tender, non-distended with normoactive bowel sounds. No hepatosplenomegaly or masses. No rebound/guarding. No CVA tenderness. No hernias. Musculoskeletal: Right arm: There is light/fading yellow ecchymosis distal to elbow near brachial area. There is tenderness in this region and at the right shoulder.  Skin: Warm and moist without erythema, ecchymosis, wounds, or rash. Neuro: A+Ox3. CN II-XII grossly intact. Moves all extremities spontaneously. Full sensation throughout. Normal gait.  Psych:   Responds to questions appropriately with a normal affect.   Assessment/Plan:  56 y.o. y/o  White male here for    Right Bicep Tendon Rupture He is to follow up with worker's comp and have this evaluated. He reports having significant pain. Print prescription for hydrocodone 5 mg one daily 4-6 hours. #60.   Generalized anxiety disorder Stable/controlled. Continue Lexapro 10 mg.  Prostate cancer screening - PSA Last PSA 04/2015. Recheck now.    Elevated lipids On pravastatin 80 mg. Recheck CMET. Lipid panel checked 06/2016 was at goal. Not fasting today.   Hypertension Blood pressure is at goal. Continue current medications and check labs monitor. - COMPLETE METABOLIC PANEL WITH GFR   Vitamin D deficiency Lab 05/02/15---Vitiman D level was low at 17----was treated with repletion.-- level checked 06/2016 and was up to normal range at 43. -  GERD (gastroesophageal reflux disease) Continue to use PPI when necessary.  Insomnia Continue taking one half of an Ambien each bedtime when necessary  Hyperglycemia This has been managed with diet changes. Will continue to check lab to monitor. - Hemoglobin A1c  CVA (cerebral vascular accident)-- History of occluded left ICA/left CT A. secondary to trauma from an accident  S/P right THA, AA-- He had a left total hip replacement in May 2012 and right hip total replacement May 2013. As well he has had lumbar epidural injections in February 2012. Today he states that he's been having no pain whatsoever" feels like he is 56 years old ". He requires no type of medications for pain whatsoever finally.  Visual disturbance See HPI.   Has had evaluation with neurologist and ophthalmologist and no etiology could be determined. Pt reports the symptoms persist.     THE FOLLOWING IS COPIED FROM CPE 05/02/2015: -1. Visit for preventive health examination  A. Screening Labs: - CBC with Differential/Platelet - COMPLETE METABOLIC PANEL WITH GFR -  Lipid panel - PSA - Vit D  25 hydroxy (rtn osteoporosis monitoring) - TSH   B. Screening For Prostate Cancer: - PSA  C.  Screening for colorectal cancer He had screening colonoscopy 09/24/2003. This was normal. Was due to repeat 09/2013. At OVs with me around 09/2013--we discussed but he deferred.   In the past patient had deferred colonoscopy but today he is agreeable for me to go ahead and refer him to GI to have this performed. - Ambulatory referral to Gastroenterology  ADDENDUM--Added 01/15/2016:  At [facility] 01/15/16 asked  patient if he followed up with colonoscopy. He says that at the time of his appointment was scheduled he was having to work a lot of overtime at work so was unable to go to that appointment. Says things have continued to be very busy at work. Says currently one employee is out with hip surgery. Says that when he things calm down at work and schedule permits he will call me and I will place another order for this.  At visit 07/16/16 he reports that he never did go see GI or have his colonoscopy. Is that his wife did finally have her screening colonoscopy and he needs to have his done. Wants me to go ahead and order another referral. Says that he will go to his appointment this time and that his work schedule is not overly busy right now and that he will be able to go to the appointment now. - Ambulatory referral to Gastroenterology  D. Immunizations: Flu-----------------------N/A Tetanus----------------he received T dap 04/20/2014 Pneumococcal-------he has no indication to need pneumonia vaccine until age 73 Zostavax-------------- will discuss at age 27    Routine office visit in 6 months or sooner as needed.     Signed:   [address] [place], [place]  01/21/2017 7:25 AM

## 2017-02-08 ENCOUNTER — Other Ambulatory Visit: Payer: Self-pay

## 2017-02-08 DIAGNOSIS — T148XXA Other injury of unspecified body region, initial encounter: Secondary | ICD-10-CM

## 2017-02-08 DIAGNOSIS — IMO0002 Reserved for concepts with insufficient information to code with codable children: Secondary | ICD-10-CM

## 2017-02-08 MED ORDER — HYDROCODONE-ACETAMINOPHEN 5-325 MG PO TABS
1.0000 | ORAL_TABLET | Freq: Four times a day (QID) | ORAL | 0 refills | Status: DC | PRN
Start: 2017-02-08 — End: 2017-08-11

## 2017-02-08 NOTE — Telephone Encounter (Signed)
Place order for Referral to Orthopedics---Ruptured Biceps Tendon--- Refill Pain med approved---same quantity as last Rx

## 2017-02-08 NOTE — Telephone Encounter (Signed)
Pt wife called and states Toney injured his arm at work and is req a referral to see an Software engineer and is also req pain med refill on his Hydrocodone.

## 2017-02-08 NOTE — Telephone Encounter (Signed)
Rx filled pt aware and referral in

## 2017-02-11 ENCOUNTER — Ambulatory Visit (INDEPENDENT_AMBULATORY_CARE_PROVIDER_SITE_OTHER): Payer: Worker's Compensation

## 2017-02-11 ENCOUNTER — Ambulatory Visit (INDEPENDENT_AMBULATORY_CARE_PROVIDER_SITE_OTHER): Payer: Worker's Compensation | Admitting: Orthopedic Surgery

## 2017-02-11 DIAGNOSIS — S46211S Strain of muscle, fascia and tendon of other parts of biceps, right arm, sequela: Secondary | ICD-10-CM

## 2017-02-11 DIAGNOSIS — M25521 Pain in right elbow: Secondary | ICD-10-CM

## 2017-02-11 DIAGNOSIS — S46211A Strain of muscle, fascia and tendon of other parts of biceps, right arm, initial encounter: Secondary | ICD-10-CM | POA: Insufficient documentation

## 2017-02-11 NOTE — Progress Notes (Signed)
Office Visit Note   Patient: Curtis Lucas           Date of Birth: 1961-05-15           MRN: [id number] Visit Date: 02/11/2017              Requested by: Orlena Sheldon, PA-C [address], [place] [address] PCP: Karis Juba, PA-C  No chief complaint on file.   HPI: DOI 01/13/17 pt states that he was lifting a box at work and that he felt an acute onset of pain at his elbow. He states that he felt and heard a "loud pop" The pt states taht it is very painful and has not resolved. He wanted to "give it some time" to see if this would get better on its own. Autumn L Forrest, RMA  Patient complains of pain with trying to lift things with the elbow states he did have initially a hematoma bruise at the antecubital fossa directly over the biceps insertion. Patient states that he did mention this to human resources but he elected to give it some time to see if it was just a sprain. Patient notices that when he tries to make a muscle that he does not have a biceps muscle on the right side.  Assessment & Plan: Visit Diagnoses:  1. Pain in right elbow   2. Biceps rupture, distal, right, sequela     Plan: Plan for reconstruction of the insertion of the biceps tendon right elbow. Patient does not have a palpable tendon at the insertion has weakness with flexion and supination and will need reconstruction of the insertion of the biceps tendon to restore strength and function to the right upper extremity. Will request workman's compensation approval for surgical intervention plan for Dr. Erlinda Hong 2 repeat tear of the biceps tendon insertion week.  Follow-Up Instructions: Return in about 2 weeks (around 02/25/2017).   Ortho Exam On examination patient is alert oriented no adenopathy well-dressed normal affect normal respiratory effort the bruising from the ruptured biceps tendon has resolved at this time. Patient has weakness with flexion on the right compared to the left he does not have  palpable biceps tendon in the antecubital fossa on the right he has a good palpable biceps tendon on the left. Patient has weakness with supination on the right. Patient has a notable lack of biceps prominence on the right when he makes a muscle with both arms.  Imaging: Xr Elbow 2 Views Right  Result Date: 02/11/2017 2 view radiographs of the right elbow shows no fat pad sign the bony anatomy is congruent with no fractures.   Orders:  Orders Placed This Encounter  Procedures  . XR Elbow 2 Views Right   No orders of the defined types were placed in this encounter.    Procedures: No procedures performed  Clinical Data: No additional findings.  Subjective: Review of Systems  Objective: Vital Signs: There were no vitals taken for this visit.  Specialty Comments:  No specialty comments available.  PMFS History: Patient Active Problem List   Diagnosis Date Noted  . Biceps rupture, distal, right, sequela 02/11/2017  . Generalized anxiety disorder 01/20/2017  . Prostate cancer screening 01/20/2017  . Vitamin D deficiency 05/06/2015  . Visual disturbance 05/02/2015  . Insomnia   . S/P right THA, AA 05/03/2012  . Hypertension   . GERD (gastroesophageal reflux disease)   . CVA (cerebral vascular accident) ([place])   . Allergy   .  Elevated lipids   . Hyperglycemia    Past Medical History:  Diagnosis Date  . Allergy    Rhinitis  . Arthritis   . CVA (cerebral vascular accident) ([place])    WEAKNESS RT HAND/ POSITIONAL DOUBLE VISION   . Difficulty sleeping   . Elevated lipids   . GERD (gastroesophageal reflux disease)   . Hyperglycemia   . Hyperlipidemia   . Hypertension   . Insomnia     Family History  Problem Relation Age of Onset  . Stroke Mother   . Cancer Mother     colon  . Dementia Mother   . Colon cancer Mother   . Parkinson's disease Father   . Heart attack Paternal Grandfather   . Dementia Paternal Grandmother   . Colon polyps Neg Hx   . Esophageal  cancer Neg Hx   . Rectal cancer Neg Hx   . Stomach cancer Neg Hx     Past Surgical History:  Procedure Laterality Date  . COLONOSCOPY  09/24/2003   patterson--normal per pt.  [name] HEAD INJURY   1987  . LUMBAR EPIDURAL INJECTION    . RECTAL SURGERY  2006  . TOTAL HIP ARTHROPLASTY    . 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 Smoking status: Never Smoker  . Smokeless tobacco: Never Used  . Alcohol use Yes     Comment: 4-5 BEERS PER WK  . Drug use: No  . Sexual activity: Yes

## 2017-02-18 ENCOUNTER — Telehealth (INDEPENDENT_AMBULATORY_CARE_PROVIDER_SITE_OTHER): Payer: Self-pay

## 2017-02-18 ENCOUNTER — Other Ambulatory Visit: Payer: Self-pay | Admitting: Physician Assistant

## 2017-02-18 ENCOUNTER — Other Ambulatory Visit (INDEPENDENT_AMBULATORY_CARE_PROVIDER_SITE_OTHER): Payer: Self-pay | Admitting: Orthopaedic Surgery

## 2017-02-18 DIAGNOSIS — S46211D Strain of muscle, fascia and tendon of other parts of biceps, right arm, subsequent encounter: Secondary | ICD-10-CM

## 2017-02-18 NOTE — Telephone Encounter (Signed)
rx filled

## 2017-02-18 NOTE — Telephone Encounter (Signed)
No out of work

## 2017-02-18 NOTE — Telephone Encounter (Signed)
Last OV 01-20-2017 Last refill   10-27-2016 Okay to refill?

## 2017-02-18 NOTE — Telephone Encounter (Signed)
Approved. # [phone] [id number]

## 2017-02-18 NOTE — Telephone Encounter (Signed)
Note made.  

## 2017-02-18 NOTE — Telephone Encounter (Signed)
Please advise. Thank you

## 2017-02-18 NOTE — Telephone Encounter (Signed)
I called wife with surgery information.  Surgery is scheduled for 02/24/17.  She wants to know if patient should continue working.  Arm is getting worse.  Patient does a lot of heavy lifting.  Please call to advise.  Jackelyn Poling [phone]

## 2017-02-19 ENCOUNTER — Encounter (HOSPITAL_BASED_OUTPATIENT_CLINIC_OR_DEPARTMENT_OTHER): Payer: Self-pay | Admitting: *Deleted

## 2017-02-22 ENCOUNTER — Encounter (HOSPITAL_BASED_OUTPATIENT_CLINIC_OR_DEPARTMENT_OTHER)
Admission: RE | Admit: 2017-02-22 | Discharge: 2017-02-22 | Disposition: A | Discharge status: Home / Self Care | Payer: Worker's Compensation | Source: Ambulatory Visit | Attending: Orthopaedic Surgery | Admitting: Orthopaedic Surgery

## 2017-02-22 DIAGNOSIS — Z7982 Long term (current) use of aspirin: Secondary | ICD-10-CM | POA: Diagnosis not present

## 2017-02-22 DIAGNOSIS — Z79899 Other long term (current) drug therapy: Secondary | ICD-10-CM | POA: Diagnosis not present

## 2017-02-22 DIAGNOSIS — S46211A Strain of muscle, fascia and tendon of other parts of biceps, right arm, initial encounter: Secondary | ICD-10-CM | POA: Diagnosis present

## 2017-02-22 DIAGNOSIS — Z96641 Presence of right artificial hip joint: Secondary | ICD-10-CM | POA: Diagnosis not present

## 2017-02-22 DIAGNOSIS — I10 Essential (primary) hypertension: Secondary | ICD-10-CM | POA: Diagnosis not present

## 2017-02-22 DIAGNOSIS — X58XXXA Exposure to other specified factors, initial encounter: Secondary | ICD-10-CM | POA: Diagnosis not present

## 2017-02-22 DIAGNOSIS — K219 Gastro-esophageal reflux disease without esophagitis: Secondary | ICD-10-CM | POA: Diagnosis not present

## 2017-02-22 DIAGNOSIS — F329 Major depressive disorder, single episode, unspecified: Secondary | ICD-10-CM | POA: Diagnosis not present

## 2017-02-22 DIAGNOSIS — E785 Hyperlipidemia, unspecified: Secondary | ICD-10-CM | POA: Diagnosis not present

## 2017-02-22 DIAGNOSIS — I69351 Hemiplegia and hemiparesis following cerebral infarction affecting right dominant side: Secondary | ICD-10-CM | POA: Diagnosis not present

## 2017-02-22 DIAGNOSIS — F419 Anxiety disorder, unspecified: Secondary | ICD-10-CM | POA: Diagnosis not present

## 2017-02-22 DIAGNOSIS — Z791 Long term (current) use of non-steroidal anti-inflammatories (NSAID): Secondary | ICD-10-CM | POA: Diagnosis not present

## 2017-02-22 DIAGNOSIS — G4700 Insomnia, unspecified: Secondary | ICD-10-CM | POA: Diagnosis not present

## 2017-02-24 ENCOUNTER — Ambulatory Visit (HOSPITAL_BASED_OUTPATIENT_CLINIC_OR_DEPARTMENT_OTHER): Payer: Worker's Compensation | Admitting: Anesthesiology

## 2017-02-24 ENCOUNTER — Encounter (HOSPITAL_BASED_OUTPATIENT_CLINIC_OR_DEPARTMENT_OTHER)
Admission: RE | Disposition: A | Discharge status: Home / Self Care | Payer: Self-pay | Source: Ambulatory Visit | Attending: Orthopaedic Surgery

## 2017-02-24 ENCOUNTER — Encounter (HOSPITAL_BASED_OUTPATIENT_CLINIC_OR_DEPARTMENT_OTHER): Payer: Self-pay | Admitting: Anesthesiology

## 2017-02-24 ENCOUNTER — Ambulatory Visit (HOSPITAL_BASED_OUTPATIENT_CLINIC_OR_DEPARTMENT_OTHER)
Admission: RE | Admit: 2017-02-24 | Discharge: 2017-02-24 | Disposition: A | Discharge status: Home / Self Care | Payer: Worker's Compensation | Source: Ambulatory Visit | Attending: Orthopaedic Surgery | Admitting: Orthopaedic Surgery

## 2017-02-24 DIAGNOSIS — S46211D Strain of muscle, fascia and tendon of other parts of biceps, right arm, subsequent encounter: Secondary | ICD-10-CM

## 2017-02-24 DIAGNOSIS — F329 Major depressive disorder, single episode, unspecified: Secondary | ICD-10-CM | POA: Diagnosis not present

## 2017-02-24 DIAGNOSIS — F419 Anxiety disorder, unspecified: Secondary | ICD-10-CM | POA: Insufficient documentation

## 2017-02-24 DIAGNOSIS — G4700 Insomnia, unspecified: Secondary | ICD-10-CM | POA: Insufficient documentation

## 2017-02-24 DIAGNOSIS — E785 Hyperlipidemia, unspecified: Secondary | ICD-10-CM | POA: Insufficient documentation

## 2017-02-24 DIAGNOSIS — S46211A Strain of muscle, fascia and tendon of other parts of biceps, right arm, initial encounter: Secondary | ICD-10-CM | POA: Diagnosis not present

## 2017-02-24 DIAGNOSIS — I69351 Hemiplegia and hemiparesis following cerebral infarction affecting right dominant side: Secondary | ICD-10-CM | POA: Insufficient documentation

## 2017-02-24 DIAGNOSIS — Z96641 Presence of right artificial hip joint: Secondary | ICD-10-CM | POA: Insufficient documentation

## 2017-02-24 DIAGNOSIS — I10 Essential (primary) hypertension: Secondary | ICD-10-CM | POA: Diagnosis not present

## 2017-02-24 DIAGNOSIS — Z7982 Long term (current) use of aspirin: Secondary | ICD-10-CM | POA: Insufficient documentation

## 2017-02-24 DIAGNOSIS — K219 Gastro-esophageal reflux disease without esophagitis: Secondary | ICD-10-CM | POA: Insufficient documentation

## 2017-02-24 DIAGNOSIS — X58XXXA Exposure to other specified factors, initial encounter: Secondary | ICD-10-CM | POA: Insufficient documentation

## 2017-02-24 DIAGNOSIS — Z791 Long term (current) use of non-steroidal anti-inflammatories (NSAID): Secondary | ICD-10-CM | POA: Insufficient documentation

## 2017-02-24 DIAGNOSIS — Z79899 Other long term (current) drug therapy: Secondary | ICD-10-CM | POA: Insufficient documentation

## 2017-02-24 HISTORY — DX: Major depressive disorder, single episode, unspecified: F32.9

## 2017-02-24 HISTORY — DX: Depression, unspecified: F32.A

## 2017-02-24 HISTORY — PX: DISTAL BICEPS TENDON REPAIR: SHX1461

## 2017-02-24 HISTORY — DX: Anxiety disorder, unspecified: F41.9

## 2017-02-24 HISTORY — DX: Strain of muscle, fascia and tendon of other parts of biceps, right arm, initial encounter: S46.211A

## 2017-02-24 SURGERY — REPAIR, TENDON, BICEPS, DISTAL
Anesthesia: Regional | Site: Arm Upper | Laterality: Right

## 2017-02-24 MED ORDER — FENTANYL CITRATE (PF) 100 MCG/2ML IJ SOLN
INTRAMUSCULAR | Status: AC
  Filled 2017-02-24: qty 2

## 2017-02-24 MED ORDER — PROMETHAZINE HCL 25 MG PO TABS: 25.0000 mg | ORAL_TABLET | Freq: Four times a day (QID) | ORAL | 1 refills | Status: DC | PRN

## 2017-02-24 MED ORDER — LIDOCAINE HCL (CARDIAC) 20 MG/ML IV SOLN
INTRAVENOUS | Status: DC | PRN
  Administered 2017-02-24: 30 mg via INTRAVENOUS

## 2017-02-24 MED ORDER — HYDROMORPHONE HCL 1 MG/ML IJ SOLN: 0.2500 mg | INTRAMUSCULAR | Status: DC | PRN

## 2017-02-24 MED ORDER — LACTATED RINGERS IV SOLN
INTRAVENOUS | Status: DC
  Administered 2017-02-24 (×3): via INTRAVENOUS

## 2017-02-24 MED ORDER — SCOPOLAMINE 1 MG/3DAYS TD PT72: 1.0000 | MEDICATED_PATCH | Freq: Once | TRANSDERMAL | Status: DC | PRN

## 2017-02-24 MED ORDER — FENTANYL CITRATE (PF) 100 MCG/2ML IJ SOLN
INTRAMUSCULAR | Status: DC | PRN
  Administered 2017-02-24: 100 ug via INTRAVENOUS
  Administered 2017-02-24 (×2): 25 ug via INTRAVENOUS

## 2017-02-24 MED ORDER — PROPOFOL 10 MG/ML IV BOLUS
INTRAVENOUS | Status: AC
  Filled 2017-02-24: qty 20

## 2017-02-24 MED ORDER — MEPERIDINE HCL 25 MG/ML IJ SOLN: 6.2500 mg | INTRAMUSCULAR | Status: DC | PRN

## 2017-02-24 MED ORDER — PROMETHAZINE HCL 25 MG/ML IJ SOLN: 6.2500 mg | INTRAMUSCULAR | Status: DC | PRN

## 2017-02-24 MED ORDER — PROPOFOL 10 MG/ML IV BOLUS
INTRAVENOUS | Status: DC | PRN
  Administered 2017-02-24: 150 mg via INTRAVENOUS

## 2017-02-24 MED ORDER — MIDAZOLAM HCL 2 MG/2ML IJ SOLN
1.0000 mg | INTRAMUSCULAR | Status: DC | PRN
  Administered 2017-02-24: 2 mg via INTRAVENOUS

## 2017-02-24 MED ORDER — DEXAMETHASONE SODIUM PHOSPHATE 4 MG/ML IJ SOLN
INTRAMUSCULAR | Status: DC | PRN
  Administered 2017-02-24: 10 mg via INTRAVENOUS

## 2017-02-24 MED ORDER — ONDANSETRON HCL 4 MG/2ML IJ SOLN
INTRAMUSCULAR | Status: AC
  Filled 2017-02-24: qty 2

## 2017-02-24 MED ORDER — ONDANSETRON HCL 4 MG/2ML IJ SOLN
INTRAMUSCULAR | Status: DC | PRN
  Administered 2017-02-24: 4 mg via INTRAVENOUS

## 2017-02-24 MED ORDER — OXYCODONE-ACETAMINOPHEN 5-325 MG PO TABS: 1.0000 | ORAL_TABLET | ORAL | 0 refills | Status: DC | PRN

## 2017-02-24 MED ORDER — MIDAZOLAM HCL 2 MG/2ML IJ SOLN
INTRAMUSCULAR | Status: AC
  Filled 2017-02-24: qty 2

## 2017-02-24 MED ORDER — GLYCOPYRROLATE 0.2 MG/ML IJ SOLN
INTRAMUSCULAR | Status: DC | PRN
  Administered 2017-02-24: 0.1 mg via INTRAVENOUS
  Administered 2017-02-24: 0.2 mg via INTRAVENOUS

## 2017-02-24 MED ORDER — BUPIVACAINE-EPINEPHRINE (PF) 0.5% -1:200000 IJ SOLN
INTRAMUSCULAR | Status: DC | PRN
Start: 2017-02-24 — End: 2017-02-24
  Administered 2017-02-24: 30 mL via PERINEURAL

## 2017-02-24 MED ORDER — CEFAZOLIN SODIUM-DEXTROSE 2-4 GM/100ML-% IV SOLN
2.0000 g | INTRAVENOUS | Status: AC
  Administered 2017-02-24: 2 g via INTRAVENOUS

## 2017-02-24 MED ORDER — CEFAZOLIN SODIUM-DEXTROSE 2-4 GM/100ML-% IV SOLN
INTRAVENOUS | Status: AC
  Filled 2017-02-24: qty 100

## 2017-02-24 MED ORDER — LIDOCAINE 2% (20 MG/ML) 5 ML SYRINGE
INTRAMUSCULAR | Status: AC
  Filled 2017-02-24: qty 5

## 2017-02-24 MED ORDER — MIDAZOLAM HCL 5 MG/5ML IJ SOLN
INTRAMUSCULAR | Status: DC | PRN
  Administered 2017-02-24: 2 mg via INTRAVENOUS

## 2017-02-24 MED ORDER — METHOCARBAMOL 750 MG PO TABS: 750.0000 mg | ORAL_TABLET | Freq: Two times a day (BID) | ORAL | 0 refills | Status: DC | PRN

## 2017-02-24 MED ORDER — DEXAMETHASONE SODIUM PHOSPHATE 10 MG/ML IJ SOLN
INTRAMUSCULAR | Status: AC
  Filled 2017-02-24: qty 1

## 2017-02-24 MED ORDER — ONDANSETRON HCL 4 MG PO TABS: 4.0000 mg | ORAL_TABLET | Freq: Three times a day (TID) | ORAL | 0 refills | Status: DC | PRN

## 2017-02-24 MED ORDER — FENTANYL CITRATE (PF) 100 MCG/2ML IJ SOLN
50.0000 ug | INTRAMUSCULAR | Status: DC | PRN
  Administered 2017-02-24: 100 ug via INTRAVENOUS

## 2017-02-24 MED ORDER — SENNOSIDES-DOCUSATE SODIUM 8.6-50 MG PO TABS: 1.0000 | ORAL_TABLET | Freq: Every evening | ORAL | 1 refills | Status: DC | PRN

## 2017-02-24 MED ORDER — MIDAZOLAM HCL 2 MG/2ML IJ SOLN: 0.5000 mg | Freq: Once | INTRAMUSCULAR | Status: DC | PRN

## 2017-02-24 SURGICAL SUPPLY — 66 items
BANDAGE ACE 6X5 VEL STRL LF (GAUZE/BANDAGES/DRESSINGS) ×3 IMPLANT
BLADE SURG 15 STRL LF DISP TIS (BLADE) ×2 IMPLANT
BLADE SURG 15 STRL SS (BLADE) ×4
BNDG ESMARK 4X9 LF (GAUZE/BANDAGES/DRESSINGS) ×3 IMPLANT
BUTTON DISTAL BICEPS (Orthopedic Implant) ×3 IMPLANT
CORDS BIPOLAR (ELECTRODE) ×3 IMPLANT
COVER BACK TABLE 60X90IN (DRAPES) ×3 IMPLANT
CUFF TOURN SGL LL 18 NRW (TOURNIQUET CUFF) ×3 IMPLANT
CUFF TOURNIQUET SINGLE 18IN (TOURNIQUET CUFF) ×3 IMPLANT
CUFF TOURNIQUET SINGLE 24IN (TOURNIQUET CUFF) IMPLANT
DECANTER SPIKE VIAL GLASS SM (MISCELLANEOUS) IMPLANT
DERMABOND ADVANCED (GAUZE/BANDAGES/DRESSINGS)
DERMABOND ADVANCED .7 DNX12 (GAUZE/BANDAGES/DRESSINGS) IMPLANT
DRAPE C-ARM 42X72 X-RAY (DRAPES) IMPLANT
DRAPE EXTREMITY T 121X128X90 (DRAPE) ×3 IMPLANT
DRAPE IMP U-DRAPE 54X76 (DRAPES) ×3 IMPLANT
DRAPE INCISE IOBAN 66X45 STRL (DRAPES) ×3 IMPLANT
DRAPE SURG 17X23 STRL (DRAPES) ×3 IMPLANT
DURAPREP 26ML APPLICATOR (WOUND CARE) ×3 IMPLANT
GAUZE SPONGE 4X4 12PLY STRL (GAUZE/BANDAGES/DRESSINGS) ×3 IMPLANT
GLOVE BIO SURGEON STRL SZ 6.5 (GLOVE) ×2 IMPLANT
GLOVE BIO SURGEONS STRL SZ 6.5 (GLOVE) ×1
GLOVE BIOGEL PI IND STRL 7.0 (GLOVE) ×2 IMPLANT
GLOVE BIOGEL PI INDICATOR 7.0 (GLOVE) ×4
GLOVE SKINSENSE NS SZ7.5 (GLOVE) ×2
GLOVE SKINSENSE STRL SZ7.5 (GLOVE) ×1 IMPLANT
GLOVE SURG SYN 7.5  E (GLOVE) ×2
GLOVE SURG SYN 7.5 E (GLOVE) ×1 IMPLANT
GOWN STRL REIN XL XLG (GOWN DISPOSABLE) ×3 IMPLANT
GOWN STRL REUS W/ TWL LRG LVL3 (GOWN DISPOSABLE) ×1 IMPLANT
GOWN STRL REUS W/TWL LRG LVL3 (GOWN DISPOSABLE) ×2
INSERTER BUTTON (SYSTAGENIX WOUND MANAGEMENT) ×3 IMPLANT
NDL SUT 6 .5 CRC .975X.05 MAYO (NEEDLE) ×1 IMPLANT
NEEDLE MAYO TAPER (NEEDLE) ×2
NEEDLE PRECISIONGLIDE 27X1.5 (NEEDLE) IMPLANT
NS IRRIG 1000ML POUR BTL (IV SOLUTION) ×3 IMPLANT
PACK BASIN DAY SURGERY FS (CUSTOM PROCEDURE TRAY) ×3 IMPLANT
PAD CAST 4YDX4 CTTN HI CHSV (CAST SUPPLIES) ×2 IMPLANT
PADDING CAST COTTON 4X4 STRL (CAST SUPPLIES) ×4
PADDING CAST SYNTHETIC 3 NS LF (CAST SUPPLIES) ×2
PADDING CAST SYNTHETIC 3X4 NS (CAST SUPPLIES) ×1 IMPLANT
PADDING CAST SYNTHETIC 4 (CAST SUPPLIES)
PADDING CAST SYNTHETIC 4X4 STR (CAST SUPPLIES) IMPLANT
PIN DRILL ACL TIGHTROPE 4MM (PIN) ×3 IMPLANT
SLEEVE SCD COMPRESS KNEE MED (MISCELLANEOUS) ×3 IMPLANT
SPLINT FIBERGLASS 3X35 (CAST SUPPLIES) IMPLANT
SPLINT FIBERGLASS 4X30 (CAST SUPPLIES) ×3 IMPLANT
SPONGE LAP 18X18 X RAY DECT (DISPOSABLE) ×3 IMPLANT
STOCKINETTE 4X48 STRL (DRAPES) ×3 IMPLANT
SUT 2 FIBERLOOP 20 STRT BLUE (SUTURE)
SUT ETHILON 3 0 PS 1 (SUTURE) IMPLANT
SUT FIBERWIRE #2 38 T-5 BLUE (SUTURE)
SUT MNCRL AB 4-0 PS2 18 (SUTURE) IMPLANT
SUT SILK 0 TIES 10X30 (SUTURE) ×3 IMPLANT
SUT VIC AB 2-0 SH 27 (SUTURE)
SUT VIC AB 2-0 SH 27XBRD (SUTURE) IMPLANT
SUT VIC AB 3-0 PS1 18 (SUTURE)
SUT VIC AB 3-0 PS1 18XBRD (SUTURE) IMPLANT
SUTURE 2 FIBERLOOP 20 STRT BLU (SUTURE) IMPLANT
SUTURE FIBERWR #2 38 T-5 BLUE (SUTURE) IMPLANT
SYR BULB 3OZ (MISCELLANEOUS) ×3 IMPLANT
SYR CONTROL 10ML LL (SYRINGE) IMPLANT
SYSTEM ARTHRO FOR DISTAL BICEP (Orthopedic Implant) ×3 IMPLANT
TOWEL OR 17X24 6PK STRL BLUE (TOWEL DISPOSABLE) ×6 IMPLANT
TOWEL OR NON WOVEN STRL DISP B (DISPOSABLE) ×3 IMPLANT
UNDERPAD 30X30 (UNDERPADS AND DIAPERS) ×3 IMPLANT

## 2017-02-24 NOTE — Anesthesia Postprocedure Evaluation (Signed)
Anesthesia Post Note  Patient: Curtis Lucas  Procedure(s) Performed: Procedure(s) (LRB): RIGHT DISTAL BICEPS TENDON REPAIR (Right)  Patient location during evaluation: PACU Anesthesia Type: General and Regional Level of consciousness: awake and alert, oriented and patient cooperative Pain management: pain level controlled Vital Signs Assessment: post-procedure vital signs reviewed and stable Respiratory status: spontaneous breathing, nonlabored ventilation and respiratory function stable Cardiovascular status: blood pressure returned to baseline and stable Postop Assessment: no signs of nausea or vomiting Anesthetic complications: no       Last Vitals:  Vitals:   02/24/17 1029 02/24/17 1128  BP:  136/89  Pulse: 73 60  Resp:  18  Temp:  36.7 C    Last Pain:  Vitals:   02/24/17 1128  TempSrc: Oral  PainSc: 0-No pain                 Kannon Baum,E. Aimee Timmons

## 2017-02-24 NOTE — Anesthesia Preprocedure Evaluation (Signed)
Anesthesia Evaluation  Patient identified by MRN, date of birth, ID band Patient awake    Reviewed: Allergy & Precautions, NPO status , Patient's Chart, lab work & pertinent test results, reviewed documented beta blocker date and time   History of Anesthesia Complications Negative for: history of anesthetic complications  Airway Mallampati: II  TM Distance: >3 FB Neck ROM: Full    Dental  (+) Caps, Dental Advisory Given   Pulmonary neg pulmonary ROS,    breath sounds clear to auscultation       Cardiovascular hypertension, Pt. on medications and Pt. on home beta blockers  Rhythm:Regular Rate:Normal     Neuro/Psych Anxiety Depression CVA (slight R sided weakness, resulted from carotid injury in his 20's), Residual Symptoms    GI/Hepatic Neg liver ROS, GERD  Medicated and Controlled,  Endo/Other  negative endocrine ROS  Renal/GU negative Renal ROS     Musculoskeletal  (+) Arthritis , Osteoarthritis,    Abdominal   Peds  Hematology   Anesthesia Other Findings   Reproductive/Obstetrics                             Anesthesia Physical Anesthesia Plan  ASA: II  Anesthesia Plan: General   Post-op Pain Management: GA combined w/ Regional for post-op pain   Induction: Intravenous  Airway Management Planned: LMA  Additional Equipment:   Intra-op Plan:   Post-operative Plan:   Informed Consent: I have reviewed the patients History and Physical, chart, labs and discussed the procedure including the risks, benefits and alternatives for the proposed anesthesia with the patient or authorized representative who has indicated his/her understanding and acceptance.   Dental advisory given  Plan Discussed with: CRNA and Surgeon  Anesthesia Plan Comments: (Plan routine monitors, GA- LMA OK, interscalene block for post op analgesia)        Anesthesia Quick Evaluation

## 2017-02-24 NOTE — Anesthesia Procedure Notes (Signed)
Anesthesia Regional Block: Interscalene brachial plexus block   Pre-Anesthetic Checklist: ,, timeout performed, Correct Patient, Correct Site, Correct Laterality, Correct Procedure, Correct Position, site marked, Risks and benefits discussed,  Surgical consent,  Pre-op evaluation,  At surgeon's request and post-op pain management  Laterality: Right  Prep: chloraprep       Needles:   Needle Type: Stimulator Needle - 40     Needle Length: 4cm  Needle Gauge: 22     Additional Needles:   Procedures:, nerve stimulator,,,,,,,   Nerve Stimulator or Paresthesia:  Response: forearm twitch, 0.45 mA, 0.1 ms,   Additional Responses:   Narrative:  Start time: 02/24/2017 8:13 AM End time: 02/24/2017 8:19 AM Injection made incrementally with aspirations every 5 mL.  Performed by: Personally  Anesthesiologist: Glennon Mac, Inanna Telford  Additional Notes: Pt identified in Holding room.  Monitors applied. Working IV access confirmed. Sterile prep R neck.  #22ga PNS to forearm twitch at 0.97mA threshold.  30cc 0.5% Bupivacaine with 1:200k epi injected incrementally after negative test dose.  Patient asymptomatic, VSS, no heme aspirated, tolerated well.  Jenita Seashore, MD

## 2017-02-24 NOTE — Progress Notes (Signed)
AssistedDr. Carswell Jackson with right, interscalene  block. Side rails up, monitors on throughout procedure. See vital signs in flow sheet. Tolerated Procedure well.  

## 2017-02-24 NOTE — Op Note (Signed)
   Date of Surgery: 02/24/2017  INDICATIONS: Curtis Lucas is a 56 y.o.-year-old male with a right distal biceps rupture;  The patient did consent to the procedure after discussion of the risks and benefits.  PREOPERATIVE DIAGNOSIS: Right distal biceps rupture  POSTOPERATIVE DIAGNOSIS: Same.  PROCEDURE: Reattachment of right distal biceps tendon to radial tuberosity  SURGEON: N. Eduard Roux, M.D.  ASSIST: none.  ANESTHESIA:  general  IV FLUIDS AND URINE: See anesthesia.  ESTIMATED BLOOD LOSS: minimal mL.  IMPLANTS: Arthrex  DRAINS: none  COMPLICATIONS: None.  DESCRIPTION OF PROCEDURE: The patient was brought to the operating room and placed supein on the operating table.  The patient had been signed prior to the procedure and this was documented. The patient had the anesthesia placed by the anesthesiologist.  A time-out was performed to confirm that this was the correct patient, site, side and location. The patient did receive antibiotics prior to the incision and was re-dosed during the procedure as needed at indicated intervals.  A tourniquet was placed.  The patient had the operative extremity prepped and draped in the standard surgical fashion.    A volar Henry approach to the proximal forearm was utilized. This was then extended proximally into the upper arm in a curvilinear fashion across the elbow crease. Blunt dissection was carried down to the fascial layer. The superficial veins were identified and ligated and retracted. The branches of the lateral antebrachial cutaneous nerve were also identified and protected. I then identified the distal biceps rupture. He did have a near-complete rupture of his insertion. A portion of it had retracted proximally and had scarred down which did not mobilize at all. About 50% of his distal biceps tendon was intact and had good mobilization. I debrided the tissue back to healthy tendon. A whipstitch was then placed in the distal biceps tendon. Once  a dissection was then carried down through the interval between pronator teres and brachioradialis to the radial tuberosity.  The insertion for the repair was confirmed using fluoroscopy. I then drilled a bicortical 3 mm hole through the radial tuberosity and then drilled a 8 mm unicortical hole in the near cortex. The FiberWire was then advanced through the foot button and this was advanced through to the far cortex of the radius and then flipped. This was confirmed on fluoroscopy. I then advanced the tendon down into the radial tuberosity. Under appropriate tension I then placed a interference screw as a backup for fixation. The wound was then thoroughly irrigated. Hemostasis was obtained. The surgical wound was closed in layer fashion using 2-0 Vicryl and 3-0 nylon. Sterile dressings were applied. The elbow was immobilized at 90 with a splint. Patient tolerated the procedure well had no immediate competitions.  POSTOPERATIVE PLAN: Patient will return back to the office in 2 weeks for suture removal in mobilization.  Curtis Cecil, MD [place] 10:28 AM

## 2017-02-24 NOTE — Anesthesia Procedure Notes (Signed)
Procedure Name: LMA Insertion Date/Time: 02/24/2017 8:38 AM Performed by: Toula Moos L Pre-anesthesia Checklist: Patient identified, Emergency Drugs available, Suction available, Patient being monitored and Timeout performed Patient Re-evaluated:Patient Re-evaluated prior to inductionOxygen Delivery Method: Circle system utilized Preoxygenation: Pre-oxygenation with 100% oxygen Intubation Type: IV induction Ventilation: Mask ventilation without difficulty LMA: LMA inserted LMA Size: 4.0 Tube size: 8.0 mm Number of attempts: 1 Airway Equipment and Method: Bite block Placement Confirmation: positive ETCO2 Tube secured with: Tape Dental Injury: Teeth and Oropharynx as per pre-operative assessment

## 2017-02-24 NOTE — Discharge Instructions (Signed)
Postoperative instructions:  Weightbearing instructions: non weight bearing  Dressing instructions: Keep your dressing and/or splint clean and dry at all times.  It will be removed at your first post-operative appointment.  Your stitches and/or staples will be removed at this visit.  Incision instructions:  Do not soak your incision for 3 weeks after surgery.  If the incision gets wet, pat dry and do not scrub the incision.  Pain control:  You have been given a prescription to be taken as directed for post-operative pain control.  In addition, elevate the operative extremity above the heart at all times to prevent swelling and throbbing pain.  Take over-the-counter Colace, 100mg  by mouth twice a day while taking narcotic pain medications to help prevent constipation.  Follow up appointments: 1) 10-14 days for suture removal and wound check. 2) Dr. Erlinda Hong as scheduled.   -------------------------------------------------------------------------------------------------------------  After Surgery Pain Control:  After your surgery, post-surgical discomfort or pain is likely. This discomfort can last several days to a few weeks. At certain times of the day your discomfort may be more intense.  Did you receive a nerve block?  A nerve block can provide pain relief for one hour to two days after your surgery. As long as the nerve block is working, you will experience little or no sensation in the area the surgeon operated on.  As the nerve block wears off, you will begin to experience pain or discomfort. It is very important that you begin taking your prescribed pain medication before the nerve block fully wears off. Treating your pain at the first sign of the block wearing off will ensure your pain is better controlled and more tolerable when full-sensation returns. Do not wait until the pain is intolerable, as the medicine will be less effective. It is better to treat pain in advance than to try and  catch up.  General Anesthesia:  If you did not receive a nerve block during your surgery, you will need to start taking your pain medication shortly after your surgery and should continue to do so as prescribed by your surgeon.  Pain Medication:  Most commonly we prescribe Vicodin and Percocet for post-operative pain. Both of these medications contain a combination of acetaminophen (Tylenol) and a narcotic to help control pain.   It takes between 30 and 45 minutes before pain medication starts to work. It is important to take your medication before your pain level gets too intense.   Nausea is a common side effect of many pain medications. You will want to eat something before taking your pain medicine to help prevent nausea.   If you are taking a prescription pain medication that contains acetaminophen, we recommend that you do not take additional over the counter acetaminophen (Tylenol).  Other pain relieving options:   Using a cold pack to ice the affected area a few times a day (15 to 20 minutes at a time) can help to relieve pain, reduce swelling and bruising.   Elevation of the affected area can also help to reduce pain and swelling.        Post Anesthesia Home Care Instructions  Activity: Get plenty of rest for the remainder of the day. A responsible adult should stay with you for 24 hours following the procedure.  For the next 24 hours, DO NOT: -Drive a car -Paediatric nurse -Drink alcoholic beverages -Take any medication unless instructed by your physician -Make any legal decisions or sign important papers.  Meals: Start with liquid foods  such as gelatin or soup. Progress to regular foods as tolerated. Avoid greasy, spicy, heavy foods. If nausea and/or vomiting occur, drink only clear liquids until the nausea and/or vomiting subsides. Call your physician if vomiting continues.  Special Instructions/Symptoms: Your throat may feel dry or sore from the anesthesia or the  breathing tube placed in your throat during surgery. If this causes discomfort, gargle with warm salt water. The discomfort should disappear within 24 hours.  If you had a scopolamine patch placed behind your ear for the management of post- operative nausea and/or vomiting:  1. The medication in the patch is effective for 72 hours, after which it should be removed.  Wrap patch in a tissue and discard in the trash. Wash hands thoroughly with soap and water. 2. You may remove the patch earlier than 72 hours if you experience unpleasant side effects which may include dry mouth, dizziness or visual disturbances. 3. Avoid touching the patch. Wash your hands with soap and water after contact with the patch.    Regional Anesthesia Blocks  1. Numbness or the inability to move the "blocked" extremity may last from 3-48 hours after placement. The length of time depends on the medication injected and your individual response to the medication. If the numbness is not going away after 48 hours, call your surgeon.  2. The extremity that is blocked will need to be protected until the numbness is gone and the  Strength has returned. Because you cannot feel it, you will need to take extra care to avoid injury. Because it may be weak, you may have difficulty moving it or using it. You may not know what position it is in without looking at it while the block is in effect.  3. For blocks in the legs and feet, returning to weight bearing and walking needs to be done carefully. You will need to wait until the numbness is entirely gone and the strength has returned. You should be able to move your leg and foot normally before you try and bear weight or walk. You will need someone to be with you when you first try to ensure you do not fall and possibly risk injury.  4. Bruising and tenderness at the needle site are common side effects and will resolve in a few days.  5. Persistent numbness or new problems with movement  should be communicated to the surgeon or the [facility] [phone] [place] [phone]).

## 2017-02-24 NOTE — Transfer of Care (Signed)
Immediate Anesthesia Transfer of Care Note  Patient: Curtis Lucas  Procedure(s) Performed: Procedure(s): RIGHT DISTAL BICEPS TENDON REPAIR (Right)  Patient Location: PACU  Anesthesia Type:GA combined with regional for post-op pain  Level of Consciousness: awake and patient cooperative  Airway & Oxygen Therapy: Patient Spontanous Breathing and Patient connected to face mask oxygen  Post-op Assessment: Report given to RN and Post -op Vital signs reviewed and stable  Post vital signs: Reviewed and stable  Last Vitals:  Vitals:   02/24/17 0820 02/24/17 0825  BP: 134/82 134/82  Pulse: 62 62  Resp: 16 17  Temp:      Last Pain:  Vitals:   02/24/17 0719  TempSrc: Oral  PainSc: 2       Patients Stated Pain Goal: 3 (97/35/32 9924)  Complications: No apparent anesthesia complications

## 2017-02-24 NOTE — H&P (Signed)
PREOPERATIVE H&P  Chief Complaint: ruptured right biceps insertion  HPI: Curtis Lucas is a 56 y.o. male who presents for surgical treatment of ruptured right biceps insertion.  He denies any changes in medical history.  Past Medical History:  Diagnosis Date  . Allergy    Rhinitis  . Anxiety   . Arthritis   . Biceps rupture, distal, right, initial encounter   . CVA (cerebral vascular accident) ([place])   . Depression   . Difficulty sleeping   . Elevated lipids   . GERD (gastroesophageal reflux disease)   . Hyperglycemia   . Hyperlipidemia   . Hypertension   . Insomnia    Past Surgical History:  Procedure Laterality Date  . COLONOSCOPY  09/24/2003   patterson--normal per pt.  [name] HEAD INJURY   1987  . JOINT REPLACEMENT    . LUMBAR EPIDURAL INJECTION    . RECTAL SURGERY  2006  . TOTAL HIP ARTHROPLASTY    . TOTAL HIP ARTHROPLASTY  05/03/2012   Procedure: TOTAL HIP ARTHROPLASTY ANTERIOR APPROACH;  Surgeon: Mauri Pole, MD;  Location: [facility];  Service: Orthopedics;  Laterality: Right;   Social History   Social History  . Marital status: Married    Spouse name: Jackelyn Poling  . Number of children: 0  . 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 Smoking status: Never Smoker  . Smokeless tobacco: Never Used  . Alcohol use Yes     Comment: 4-5 BEERS PER WK  . Drug use: No  . Sexual activity: Yes   Other Topics Concern  . None   Social History Narrative   Patient lives at home with spouse.   Caffeine Use:4 16oz Mt. Dew sodas daily   Family History  Problem Relation Age of Onset  . Stroke Mother   . Cancer Mother     colon  . Dementia Mother   . Colon cancer Mother   . Parkinson's disease Father   . Heart attack Paternal Grandfather   . Dementia Paternal Grandmother   . Colon polyps Neg Hx   . Esophageal cancer Neg Hx   . Rectal cancer Neg Hx   . Stomach cancer Neg Hx     Allergies  Allergen Reactions  . Lipitor [Atorvastatin Calcium]     Severe muscle spasms   Prior to Admission medications   Medication Sig Start Date End Date Taking? Authorizing Provider  amLODipine (NORVASC) 10 MG tablet TAKE 1 TABLET EVERY DAY 12/28/16  Yes Susy Frizzle, MD  aspirin 81 MG tablet Take 81 mg by mouth daily.   Yes Historical Provider, MD  atenolol (TENORMIN) 100 MG tablet TAKE 1 TABLET BY MOUTH EVERY DAY 09/29/16  Yes Orlena Sheldon, PA-C  escitalopram (LEXAPRO) 10 MG tablet Take 1 tablet (10 mg total) by mouth daily. 12/04/16  Yes Susy Frizzle, MD  HYDROcodone-acetaminophen (NORCO/VICODIN) 5-325 MG tablet Take 1 tablet by mouth every 6 (six) hours as needed. 02/08/17  Yes Mary B Dixon, PA-C  meloxicam (MOBIC) 7.5 MG tablet TAKE 1 TABLET BY MOUTH EVERY DAY WITH FOOD 12/31/16  Yes Orlena Sheldon, PA-C  Omega-3 Fatty Acids (FISH OIL) 1200 MG CAPS Take 1 capsule by mouth daily.   Yes Historical Provider, MD  pantoprazole (PROTONIX) 40 MG tablet Take 40 mg by mouth daily. 01/05/17  Yes Historical Provider, MD  pravastatin (PRAVACHOL) 80  MG tablet TAKE 1 TABLET EVERY DAY 09/10/16  Yes Orlena Sheldon, PA-C  zolpidem (AMBIEN) 10 MG tablet TAKE 1 TABLET AT BEDTIME AS NEEDED 02/18/17  Yes Orlena Sheldon, PA-C     Positive ROS: All other systems have been reviewed and were otherwise negative with the exception of those mentioned in the HPI and as above.  Physical Exam: General: Alert, no acute distress Cardiovascular: No pedal edema Respiratory: No cyanosis, no use of accessory musculature GI: abdomen soft Skin: No lesions in the area of chief complaint Neurologic: Sensation intact distally Psychiatric: Patient is competent for consent with normal mood and affect Lymphatic: no lymphedema  MUSCULOSKELETAL: exam stable  Assessment: ruptured right biceps insertion  Plan: Plan for Procedure(s): RIGHT DISTAL BICEPS TENDON REPAIR  The risks benefits and alternatives were  discussed with the patient including but not limited to the risks of nonoperative treatment, versus surgical intervention including infection, bleeding, nerve injury,  blood clots, cardiopulmonary complications, morbidity, mortality, among others, and they were willing to proceed.   Eduard Roux, MD   02/24/2017 7:14 AM

## 2017-03-01 ENCOUNTER — Encounter (HOSPITAL_BASED_OUTPATIENT_CLINIC_OR_DEPARTMENT_OTHER): Payer: Self-pay | Admitting: Orthopaedic Surgery

## 2017-03-08 ENCOUNTER — Telehealth (INDEPENDENT_AMBULATORY_CARE_PROVIDER_SITE_OTHER): Payer: Self-pay | Admitting: *Deleted

## 2017-03-08 MED ORDER — OXYCODONE-ACETAMINOPHEN 5-325 MG PO TABS: 1.0000 | ORAL_TABLET | Freq: Four times a day (QID) | ORAL | 0 refills | Status: DC | PRN

## 2017-03-08 NOTE — Telephone Encounter (Signed)
PENDING SIGNATURE

## 2017-03-08 NOTE — Telephone Encounter (Signed)
Patient's wife called in this morning in regards to needing prescription refill on his Oxycodone please. CB # [phone]. Thank you

## 2017-03-08 NOTE — Telephone Encounter (Signed)
Please advise 

## 2017-03-08 NOTE — Telephone Encounter (Signed)
Yes #30

## 2017-03-08 NOTE — Addendum Note (Signed)
Addended by: Precious Bard on: 03/08/2017 04:52 PM   Modules accepted: Orders

## 2017-03-09 NOTE — Telephone Encounter (Signed)
Rx ready for pick up at the front desk. LMOM

## 2017-03-12 ENCOUNTER — Encounter (INDEPENDENT_AMBULATORY_CARE_PROVIDER_SITE_OTHER): Payer: Self-pay | Admitting: Orthopaedic Surgery

## 2017-03-12 ENCOUNTER — Ambulatory Visit (INDEPENDENT_AMBULATORY_CARE_PROVIDER_SITE_OTHER): Payer: Worker's Compensation | Admitting: Orthopaedic Surgery

## 2017-03-12 DIAGNOSIS — S46211S Strain of muscle, fascia and tendon of other parts of biceps, right arm, sequela: Secondary | ICD-10-CM

## 2017-03-12 NOTE — Progress Notes (Signed)
Patient is 2 weeks status post right distal biceps repair. He is doing well. Taking occasional Percocets. The incisions healed without any signs of infection. He is neurovascularly intact. He does have some numbness in the volar forearm. At this point we are going to begin gentle range of motion with OT. No strengthening yet. Anticipate out of work is going to be about 3 months. Sutures were removed. Follow-up in 4 weeks for recheck. Questions encouraged and answered

## 2017-03-24 ENCOUNTER — Telehealth (INDEPENDENT_AMBULATORY_CARE_PROVIDER_SITE_OTHER): Payer: Self-pay | Admitting: Orthopaedic Surgery

## 2017-03-24 ENCOUNTER — Ambulatory Visit (INDEPENDENT_AMBULATORY_CARE_PROVIDER_SITE_OTHER): Payer: Worker's Compensation | Admitting: Family

## 2017-03-24 DIAGNOSIS — S46211D Strain of muscle, fascia and tendon of other parts of biceps, right arm, subsequent encounter: Secondary | ICD-10-CM

## 2017-03-24 NOTE — Telephone Encounter (Signed)
PT STATED HE NEEDED A SCRIPT TO TREAT HIS SHOULDER AS WELL AS HIS ELBOW FOR THERAPY. HE STATED WC WOULD NOT PAY FOR SHOULDER UNLESS HE HAS A SCRIPT STATING IT IS NECESSARY.  [phone]

## 2017-03-24 NOTE — Telephone Encounter (Signed)
[place] TO [phone]

## 2017-03-24 NOTE — Telephone Encounter (Signed)
Faxed order per Junie Panning

## 2017-03-24 NOTE — Progress Notes (Signed)
Office Visit Note   Patient: Curtis Lucas           Date of Birth: 06-04-1961           MRN: [id number] Visit Date: 03/24/2017              Requested by: Orlena Sheldon, PA-C [id number], [place] [id number] PCP: Karis Juba, PA-C  No chief complaint on file.     HPI: Patient is a 61 old gentleman who presents today for evaluation of a right arm injury. He is 3-1/2 weeks out from a distal biceps repair by Dr. Erlinda Hong. Has had one physical therapy visit. States his dog stepped on elbow and he is worried about his incision.  Assessment & Plan: Visit Diagnoses:  1. Rupture of right distal biceps tendon, subsequent encounter     Plan: He will cleanse incision daily. May apply neosporin and band aid. Proceed with PT. Follow up with Erlinda Hong as scheduled.  Follow-Up Instructions: Return for as scheduled with xu.   Ortho Exam  Patient is alert, oriented, no adenopathy, well-dressed, normal affect, normal respiratory effort. Incision is healing well. Does have some keloiding of scar. Has one open area on center of forearm incision this is 3 mm in length. Was debrided of nonviable tissue. Just 1 mm of depth. Doesn't probe. No drainage. No surrounding erythema. No sign of infection. Distal biceps tendon palpable, intact.  Imaging: No results found.  Labs: Lab Results  Component Value Date   HGBA1C 5.5 07/16/2016   HGBA1C 5.7 (H) 01/15/2016   HGBA1C 5.7 (H) 05/02/2015    Orders:  No orders of the defined types were placed in this encounter.  No orders of the defined types were placed in this encounter.    Procedures: No procedures performed  Clinical Data: No additional findings.  ROS:  All other systems negative, except as noted in the HPI. Review of Systems  Constitutional: Negative for chills and fever.  Musculoskeletal: Positive for myalgias.  Skin: Positive for wound. Negative for color change and rash.    Objective: Vital Signs: There were no  vitals taken for this visit.  Specialty Comments:  No specialty comments available.  PMFS History: Patient Active Problem List   Diagnosis Date Noted  . Rupture of right distal biceps tendon 02/11/2017  . Generalized anxiety disorder 01/20/2017  . Prostate cancer screening 01/20/2017  . Vitamin D deficiency 05/06/2015  . Visual disturbance 05/02/2015  . Insomnia   . S/P right THA, AA 05/03/2012  . Hypertension   . GERD (gastroesophageal reflux disease)   . CVA (cerebral vascular accident) ([place])   . Allergy   . Elevated lipids   . Hyperglycemia    Past Medical History:  Diagnosis Date  . Allergy    Rhinitis  . Anxiety   . Arthritis   . Biceps rupture, distal, right, initial encounter   . CVA (cerebral vascular accident) ([place])   . Depression   . Difficulty sleeping   . Elevated lipids   . GERD (gastroesophageal reflux disease)   . Hyperglycemia   . Hyperlipidemia   . Hypertension   . Insomnia     Family History  Problem Relation Age of Onset  . Stroke Mother   . Cancer Mother     colon  . Dementia Mother   . Colon cancer Mother   . Parkinson's disease Father   . Heart attack Paternal Grandfather   . Dementia Paternal Grandmother   .  Colon polyps Neg Hx   . Esophageal cancer Neg Hx   . Rectal cancer Neg Hx   . Stomach cancer Neg Hx     Past Surgical History:  Procedure Laterality Date  . COLONOSCOPY  09/24/2003   patterson--normal per pt.  [name] DISTAL BICEPS TENDON REPAIR Right 02/24/2017   Procedure: RIGHT DISTAL BICEPS TENDON REPAIR;  Surgeon: Leandrew Koyanagi, MD;  Location: [facility];  Service: Orthopedics;  Laterality: Right;  . HEAD INJURY   1987  . JOINT REPLACEMENT    . LUMBAR EPIDURAL INJECTION    . RECTAL SURGERY  2006  . TOTAL HIP ARTHROPLASTY    . TOTAL HIP ARTHROPLASTY  05/03/2012   Procedure: TOTAL HIP ARTHROPLASTY ANTERIOR APPROACH;  Surgeon: Mauri Pole, MD;  Location: [facility];  Service: Orthopedics;  Laterality: Right;   Social  History   Occupational History  .  [employer], clinical   Social History Main Topics  . Smoking status: Never Smoker  . Smokeless tobacco: Never Used  . Alcohol use Yes     Comment: 4-5 BEERS PER WK  . Drug use: No  . Sexual activity: Yes

## 2017-03-24 NOTE — Telephone Encounter (Signed)
You saw this patient today it looks like. Can you advise on message please. Thank you.

## 2017-03-24 NOTE — Telephone Encounter (Signed)
We can just send and order for PT to eval and treat patient: s/p distal biceps tendon repair

## 2017-03-28 ENCOUNTER — Other Ambulatory Visit: Payer: Self-pay | Admitting: Physician Assistant

## 2017-03-28 DIAGNOSIS — M545 Low back pain, unspecified: Secondary | ICD-10-CM

## 2017-03-29 NOTE — Telephone Encounter (Signed)
Medication refilled per protocol. 

## 2017-03-30 ENCOUNTER — Telehealth (INDEPENDENT_AMBULATORY_CARE_PROVIDER_SITE_OTHER): Payer: Self-pay | Admitting: Orthopaedic Surgery

## 2017-03-30 NOTE — Telephone Encounter (Signed)
Faxed to [phone]

## 2017-03-30 NOTE — Telephone Encounter (Signed)
Phys therapy and hand need something faxed to do PT on pt shoulder as well. See prior messages.  [phone] attn Estill Bamberg

## 2017-04-12 ENCOUNTER — Ambulatory Visit (INDEPENDENT_AMBULATORY_CARE_PROVIDER_SITE_OTHER): Payer: Worker's Compensation | Admitting: Orthopaedic Surgery

## 2017-04-12 ENCOUNTER — Encounter (INDEPENDENT_AMBULATORY_CARE_PROVIDER_SITE_OTHER): Payer: Self-pay | Admitting: Orthopaedic Surgery

## 2017-04-12 DIAGNOSIS — S46211D Strain of muscle, fascia and tendon of other parts of biceps, right arm, subsequent encounter: Secondary | ICD-10-CM

## 2017-04-12 MED ORDER — DICLOFENAC SODIUM 75 MG PO TBEC: 75.0000 mg | DELAYED_RELEASE_TABLET | Freq: Two times a day (BID) | ORAL | 2 refills | Status: DC

## 2017-04-12 NOTE — Progress Notes (Signed)
Stanislav is about 7 weeks status post right distal biceps tendon repair. He is doing physical therapy for range of motion. He is doing well. Referred pain to the shoulder. Modic does not help significantly. On exam his flexibility is excellent. He has grossly intact biceps function. He does have numbness in the LABC distribution. At this point he may advance to strengthening with physical therapy continue out of work. I'll see him back in about 6 weeks for recheck.

## 2017-04-14 ENCOUNTER — Other Ambulatory Visit: Payer: Self-pay | Admitting: Physician Assistant

## 2017-04-14 ENCOUNTER — Encounter: Payer: Self-pay | Admitting: Physician Assistant

## 2017-04-14 NOTE — Telephone Encounter (Signed)
Refill appropriate 

## 2017-04-29 ENCOUNTER — Telehealth (INDEPENDENT_AMBULATORY_CARE_PROVIDER_SITE_OTHER): Payer: Self-pay

## 2017-04-29 NOTE — Telephone Encounter (Signed)
Faxed the 04/12/17 office note to [facility] per her request

## 2017-05-27 ENCOUNTER — Ambulatory Visit (INDEPENDENT_AMBULATORY_CARE_PROVIDER_SITE_OTHER): Payer: Worker's Compensation | Admitting: Orthopaedic Surgery

## 2017-05-27 ENCOUNTER — Encounter (INDEPENDENT_AMBULATORY_CARE_PROVIDER_SITE_OTHER): Payer: Self-pay | Admitting: Orthopaedic Surgery

## 2017-05-27 DIAGNOSIS — S46211D Strain of muscle, fascia and tendon of other parts of biceps, right arm, subsequent encounter: Secondary | ICD-10-CM | POA: Diagnosis not present

## 2017-05-27 NOTE — Progress Notes (Signed)
Office Visit Note   Patient: Curtis Lucas           Date of Birth: 1961/04/19           MRN: [id number] Visit Date: 05/27/2017              Requested by: Orlena Sheldon, PA-C [address] [place], [place] [address] PCP: Orlena Sheldon, PA-C   Assessment & Plan: Visit Diagnoses:  1. Rupture of right distal biceps tendon, subsequent encounter     Plan: At this point I don't think he is ready to go back to work as his job is very physical and requires a lot of heavy lifting. He is significantly weak still. He needs to do more physical therapy. He is not at [facility] yet. We will plan on doing an FCE in the future to evaluate for any permanent impairments. Follow-up in 6 weeks for recheck.  Follow-Up Instructions: Return in about 6 weeks (around 07/08/2017).   Orders:  No orders of the defined types were placed in this encounter.  No orders of the defined types were placed in this encounter.     Procedures: No procedures performed   Clinical Data: No additional findings.   Subjective: Chief Complaint  Patient presents with  . Right Shoulder - Pain  . Right Elbow - Pain    Patient is a 56 year old gentleman who is 3 months status post right distal biceps repair with augmentation of allograft. He has done 12 sessions of physical therapy but continues to have weakness and disability. He's been compensating with his shoulder which is caused the shoulder to hurt. He denies any radiation of pain.    Review of Systems   Objective: Vital Signs: There were no vitals taken for this visit.  Physical Exam  Ortho Exam Left upper extremity exam shows full range of motion. Her surgical scars fully healed. He does have significant weakness with elbow flexion and forearm supination. Specialty Comments:  No specialty comments available.  Imaging: No results found.   PMFS History: Patient Active Problem List   Diagnosis Date Noted  . Rupture of right distal biceps  tendon 02/11/2017  . Generalized anxiety disorder 01/20/2017  . Prostate cancer screening 01/20/2017  . Vitamin D deficiency 05/06/2015  . Visual disturbance 05/02/2015  . Insomnia   . S/P right THA, AA 05/03/2012  . Hypertension   . GERD (gastroesophageal reflux disease)   . CVA (cerebral vascular accident) ([place])   . Allergy   . Elevated lipids   . Hyperglycemia    Past Medical History:  Diagnosis Date  . Allergy    Rhinitis  . Anxiety   . Arthritis   . Biceps rupture, distal, right, initial encounter   . CVA (cerebral vascular accident) ([place])   . Depression   . Difficulty sleeping   . Elevated lipids   . GERD (gastroesophageal reflux disease)   . Hyperglycemia   . Hyperlipidemia   . Hypertension   . Insomnia     Family History  Problem Relation Age of Onset  . Stroke Mother   . Cancer Mother        colon  . Dementia Mother   . Colon cancer Mother   . Parkinson's disease Father   . Heart attack Paternal Grandfather   . Dementia Paternal Grandmother   . Colon polyps Neg Hx   . Esophageal cancer Neg Hx   . Rectal cancer Neg Hx   . Stomach  cancer Neg Hx     Past Surgical History:  Procedure Laterality Date  . COLONOSCOPY  09/24/2003   patterson--normal per pt.  [name] DISTAL BICEPS TENDON REPAIR Right 02/24/2017   Procedure: RIGHT DISTAL BICEPS TENDON REPAIR;  Surgeon: Leandrew Koyanagi, MD;  Location: [facility];  Service: Orthopedics;  Laterality: Right;  . HEAD INJURY   1987  . JOINT REPLACEMENT    . LUMBAR EPIDURAL INJECTION    . RECTAL SURGERY  2006  . TOTAL HIP ARTHROPLASTY    . 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 Smoking status: Never Smoker  . Smokeless tobacco: Never Used  .  Alcohol use Yes     Comment: 4-5 BEERS PER WK  . Drug use: No  . Sexual activity: Yes

## 2017-06-02 ENCOUNTER — Other Ambulatory Visit: Payer: Self-pay | Admitting: Family Medicine

## 2017-06-02 DIAGNOSIS — F411 Generalized anxiety disorder: Secondary | ICD-10-CM

## 2017-06-21 ENCOUNTER — Other Ambulatory Visit: Payer: Self-pay | Admitting: Family Medicine

## 2017-06-22 ENCOUNTER — Other Ambulatory Visit: Payer: Self-pay | Admitting: Physician Assistant

## 2017-06-22 NOTE — Telephone Encounter (Signed)
Refill appropriate 

## 2017-06-27 ENCOUNTER — Other Ambulatory Visit: Payer: Self-pay | Admitting: Physician Assistant

## 2017-06-28 NOTE — Telephone Encounter (Signed)
Refill appropriate 

## 2017-07-03 ENCOUNTER — Other Ambulatory Visit (INDEPENDENT_AMBULATORY_CARE_PROVIDER_SITE_OTHER): Payer: Self-pay | Admitting: Orthopaedic Surgery

## 2017-07-08 ENCOUNTER — Encounter (INDEPENDENT_AMBULATORY_CARE_PROVIDER_SITE_OTHER): Payer: Self-pay | Admitting: Orthopaedic Surgery

## 2017-07-08 ENCOUNTER — Ambulatory Visit (INDEPENDENT_AMBULATORY_CARE_PROVIDER_SITE_OTHER): Payer: Worker's Compensation | Admitting: Orthopaedic Surgery

## 2017-07-08 DIAGNOSIS — S46211D Strain of muscle, fascia and tendon of other parts of biceps, right arm, subsequent encounter: Secondary | ICD-10-CM | POA: Diagnosis not present

## 2017-07-08 DIAGNOSIS — M7541 Impingement syndrome of right shoulder: Secondary | ICD-10-CM | POA: Diagnosis not present

## 2017-07-08 NOTE — Progress Notes (Signed)
Office Visit Note   Patient: Curtis Lucas           Date of Birth: 1961/09/01           MRN: [id number] Visit Date: 07/08/2017              Requested by: Orlena Sheldon, PA-C [address], [place] [address] PCP: Orlena Sheldon, PA-C   Assessment & Plan: Visit Diagnoses:  1. Impingement syndrome of right shoulder   2. Rupture of right distal biceps tendon, subsequent encounter     Plan: Patient is presenting with shoulder impingement. Subacromial injection was performed today. Recheck in 2 weeks to see if he is doing well. If not better MRI for full evaluation. In terms of his elbow he is still not ready for work yet. He does heavy labor at work. I would like to get him started with work conditioning at this point. Follow-up in 2 weeks to recheck shoulder.   Follow-Up Instructions: Return in about 2 weeks (around 07/22/2017).   Orders:  No orders of the defined types were placed in this encounter.  No orders of the defined types were placed in this encounter.     Procedures: Large Joint Inj Date/Time: 07/08/2017 11:24 AM Performed by: Leandrew Koyanagi Authorized by: Leandrew Koyanagi   Consent Given by:  Patient Timeout: prior to procedure the correct patient, procedure, and site was verified   Indications:  Pain Location:  Shoulder Site:  R subacromial bursa Prep: patient was prepped and draped in usual sterile fashion   Needle Size:  22 G Approach:  Posterior Ultrasound Guidance: No   Fluoroscopic Guidance: No       Clinical Data: No additional findings.   Subjective: Chief Complaint  Patient presents with  . Right Elbow - Pain  . Left Elbow - Pain    Curtis Lucas is 4 months status post right distal biceps tendon repair and augmentation with allograft. He is progressing with physical therapy. He continues to have discomfort with his right shoulder. He's been having right shoulder since the injury when he had to compensate for his elbow. He has really  noticed that the shoulder is bothering him is really slowing him down with physical therapy. He will occasionally get pain in his forearm and elbow and radiates down into the hand especially with exertion.    Review of Systems  Constitutional: Negative.   All other systems reviewed and are negative.    Objective: Vital Signs: There were no vitals taken for this visit.  Physical Exam  Constitutional: He is oriented to person, place, and time. He appears well-developed and well-nourished.  Pulmonary/Chest: Effort normal.  Abdominal: Soft.  Neurological: He is alert and oriented to person, place, and time.  Skin: Skin is warm.  Psychiatric: He has a normal mood and affect. His behavior is normal. Judgment and thought content normal.  Nursing note and vitals reviewed.   Ortho Exam Right shoulder exam shows positive impingement signs. Rotator cuff testing is normal. Full range of motion.  Right elbow exam shows a fully healed surgical scar. He does have expected weakness with elbow flexion and supination. Specialty Comments:  No specialty comments available.  Imaging: No results found.   PMFS History: Patient Active Problem List   Diagnosis Date Noted  . Impingement syndrome of right shoulder 07/08/2017  . Rupture of right distal biceps tendon 02/11/2017  . Generalized anxiety disorder 01/20/2017  . Prostate cancer screening  01/20/2017  . Vitamin D deficiency 05/06/2015  . Visual disturbance 05/02/2015  . Insomnia   . S/P right THA, AA 05/03/2012  . Hypertension   . GERD (gastroesophageal reflux disease)   . CVA (cerebral vascular accident) ([place])   . Allergy   . Elevated lipids   . Hyperglycemia    Past Medical History:  Diagnosis Date  . Allergy    Rhinitis  . Anxiety   . Arthritis   . Biceps rupture, distal, right, initial encounter   . CVA (cerebral vascular accident) ([place])   . Depression   . Difficulty sleeping   . Elevated lipids   . GERD  (gastroesophageal reflux disease)   . Hyperglycemia   . Hyperlipidemia   . Hypertension   . Insomnia     Family History  Problem Relation Age of Onset  . Stroke Mother   . Cancer Mother        colon  . Dementia Mother   . Colon cancer Mother   . Parkinson's disease Father   . Heart attack Paternal Grandfather   . Dementia Paternal Grandmother   . Colon polyps Neg Hx   . Esophageal cancer Neg Hx   . Rectal cancer Neg Hx   . Stomach cancer Neg Hx     Past Surgical History:  Procedure Laterality Date  . COLONOSCOPY  09/24/2003   patterson--normal per pt.  [name] DISTAL BICEPS TENDON REPAIR Right 02/24/2017   Procedure: RIGHT DISTAL BICEPS TENDON REPAIR;  Surgeon: Leandrew Koyanagi, MD;  Location: [facility];  Service: Orthopedics;  Laterality: Right;  . HEAD INJURY   1987  . JOINT REPLACEMENT    . LUMBAR EPIDURAL INJECTION    . RECTAL SURGERY  2006  . TOTAL HIP ARTHROPLASTY    . 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 Smoking status: Never Smoker  . Smokeless tobacco: Never Used  . Alcohol use Yes     Comment: 4-5 BEERS PER WK  . Drug use: No  . Sexual activity: Yes

## 2017-07-14 ENCOUNTER — Telehealth (INDEPENDENT_AMBULATORY_CARE_PROVIDER_SITE_OTHER): Payer: Self-pay

## 2017-07-14 ENCOUNTER — Other Ambulatory Visit: Payer: Self-pay | Admitting: Physician Assistant

## 2017-07-14 NOTE — Telephone Encounter (Signed)
Out of work 

## 2017-07-14 NOTE — Telephone Encounter (Signed)
Work comp needing current work status note from the 07/08/17 appointment

## 2017-07-15 ENCOUNTER — Telehealth (INDEPENDENT_AMBULATORY_CARE_PROVIDER_SITE_OTHER): Payer: Self-pay | Admitting: Orthopaedic Surgery

## 2017-07-15 ENCOUNTER — Encounter (INDEPENDENT_AMBULATORY_CARE_PROVIDER_SITE_OTHER): Payer: Self-pay

## 2017-07-15 NOTE — Telephone Encounter (Signed)
Plan of Care faxed 07/15/17 °

## 2017-07-15 NOTE — Telephone Encounter (Signed)
Generated work note and faxed to Eldridge Abrahams @ [phone] along with the 07/08/17 office note

## 2017-07-16 ENCOUNTER — Other Ambulatory Visit: Payer: Self-pay | Admitting: Physician Assistant

## 2017-07-16 NOTE — Telephone Encounter (Signed)
Rx called in to pharmacy. 

## 2017-07-16 NOTE — Telephone Encounter (Signed)
ok 

## 2017-07-16 NOTE — Telephone Encounter (Signed)
Ok to refill 

## 2017-07-21 ENCOUNTER — Ambulatory Visit: Payer: 59 | Admitting: Physician Assistant

## 2017-07-22 ENCOUNTER — Telehealth (INDEPENDENT_AMBULATORY_CARE_PROVIDER_SITE_OTHER): Payer: Self-pay

## 2017-07-22 ENCOUNTER — Ambulatory Visit (INDEPENDENT_AMBULATORY_CARE_PROVIDER_SITE_OTHER): Payer: Worker's Compensation | Admitting: Orthopaedic Surgery

## 2017-07-22 ENCOUNTER — Encounter (INDEPENDENT_AMBULATORY_CARE_PROVIDER_SITE_OTHER): Payer: Self-pay | Admitting: Orthopaedic Surgery

## 2017-07-22 DIAGNOSIS — M7022 Olecranon bursitis, left elbow: Secondary | ICD-10-CM

## 2017-07-22 MED ORDER — METHYLPREDNISOLONE ACETATE 40 MG/ML IJ SUSP
40.0000 mg | INTRAMUSCULAR | Status: AC | PRN
  Administered 2017-07-22: 40 mg via INTRA_ARTICULAR

## 2017-07-22 MED ORDER — BUPIVACAINE HCL 0.5 % IJ SOLN
1.0000 mL | INTRAMUSCULAR | Status: AC | PRN
  Administered 2017-07-22: 1 mL via INTRA_ARTICULAR

## 2017-07-22 NOTE — Progress Notes (Signed)
Office Visit Note   Patient: Curtis Lucas           Date of Birth: Mar 31, 1961           MRN: [id number] Visit Date: 07/22/2017              Requested by: Curtis Sheldon, PA-C [address] [place], [place] [address] PCP: Curtis Sheldon, PA-C   Assessment & Plan: Visit Diagnoses:  1. Olecranon bursitis of left elbow     Plan: Left olecranon bursa was aspirated and injected with cortisone today. Compression wrap was applied. Questions encouraged and answered. 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  Follow-Up Instructions: Return if symptoms worsen or fail to improve.   Orders:  No orders of the defined types were placed in this encounter.  No orders of the defined types were placed in this encounter.     Procedures: Medium Joint Inj Date/Time: 07/22/2017 10:37 AM Performed by: Curtis Lucas Authorized by: Curtis Lucas   Consent Given by:  Patient Indications:  Pain Location:  Elbow Site:  L olecranon bursa Ultrasound Guided: No   Fluoroscopic Guidance: No   Medications:  40 mg methylPREDNISolone acetate 40 MG/ML; 1 mL bupivacaine 0.5 %     Clinical Data: No additional findings.   Subjective: Chief Complaint  Patient presents with  . Right Elbow - Pain, Follow-up    Curtis Lucas comes back today for follow-up of his right shoulder impingement which is significantly better from the injection. He has a separate problem of left olecranon bursitis. He wants this drained. This continues to bother him.    Review of Systems  Constitutional: Negative.   All other systems reviewed and are negative.    Objective: Vital Signs: There were no vitals taken for this visit.  Physical Exam  Constitutional: He is oriented to person, place, and time. He appears well-developed and well-nourished.  Pulmonary/Chest: Effort normal.  Abdominal: Soft.  Neurological: He is alert and oriented to person, place, and time.  Skin: Skin is  warm.  Psychiatric: He has a normal mood and affect. His behavior is normal. Judgment and thought content normal.  Nursing note and vitals reviewed.   Ortho Exam Left elbow exam is consistent with olecranon bursitis. There is no skin changes or signs of infection. Specialty Comments:  No specialty comments available.  Imaging: No results found.   PMFS History: Patient Active Problem List   Diagnosis Date Noted  . Olecranon bursitis of left elbow 07/22/2017  . Impingement syndrome of right shoulder 07/08/2017  . Rupture of right distal biceps tendon 02/11/2017  . Generalized anxiety disorder 01/20/2017  . Prostate cancer screening 01/20/2017  . Vitamin D deficiency 05/06/2015  . Visual disturbance 05/02/2015  . Insomnia   . S/P right THA, AA 05/03/2012  . Hypertension   . GERD (gastroesophageal reflux disease)   . CVA (cerebral vascular accident) ([place])   . Allergy   . Elevated lipids   . Hyperglycemia    Past Medical History:  Diagnosis Date  . Allergy    Rhinitis  . Anxiety   . Arthritis   . Biceps rupture, distal, right, initial encounter   . CVA (cerebral vascular accident) ([place])   . Depression   . Difficulty sleeping   . Elevated lipids   . GERD (gastroesophageal reflux disease)   . Hyperglycemia   . Hyperlipidemia   . Hypertension   . Insomnia  Family History  Problem Relation Age of Onset  . Stroke Mother   . Cancer Mother        colon  . Dementia Mother   . Colon cancer Mother   . Parkinson's disease Father   . Heart attack Paternal Grandfather   . Dementia Paternal Grandmother   . Colon polyps Neg Hx   . Esophageal cancer Neg Hx   . Rectal cancer Neg Hx   . Stomach cancer Neg Hx     Past Surgical History:  Procedure Laterality Date  . COLONOSCOPY  09/24/2003   patterson--normal per pt.  [name] DISTAL BICEPS TENDON REPAIR Right 02/24/2017   Procedure: RIGHT DISTAL BICEPS TENDON REPAIR;  Surgeon: Curtis Koyanagi, MD;  Location: [facility];  Service: Orthopedics;  Laterality: Right;  . HEAD INJURY   1987  . JOINT REPLACEMENT    . LUMBAR EPIDURAL INJECTION    . RECTAL SURGERY  2006  . TOTAL HIP ARTHROPLASTY    . 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 Smoking status: Never Smoker  . Smokeless tobacco: Never Used  . Alcohol use Yes     Comment: 4-5 BEERS PER WK  . Drug use: No  . Sexual activity: Yes

## 2017-07-22 NOTE — Telephone Encounter (Signed)
yes

## 2017-07-22 NOTE — Telephone Encounter (Signed)
Is this okay?

## 2017-07-22 NOTE — Telephone Encounter (Signed)
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

## 2017-07-22 NOTE — Telephone Encounter (Signed)
Faxed order and sent order to be scanned on epic

## 2017-07-26 ENCOUNTER — Telehealth (INDEPENDENT_AMBULATORY_CARE_PROVIDER_SITE_OTHER): Payer: Self-pay

## 2017-07-26 ENCOUNTER — Encounter: Payer: Self-pay | Admitting: Physician Assistant

## 2017-07-26 ENCOUNTER — Telehealth (INDEPENDENT_AMBULATORY_CARE_PROVIDER_SITE_OTHER): Payer: Self-pay | Admitting: Orthopaedic Surgery

## 2017-07-26 NOTE — Telephone Encounter (Signed)
Faxed the referral for work conditioning to [facility] for scheduling per their request [phone]

## 2017-07-26 NOTE — Telephone Encounter (Signed)
Patric Dykes with [employer] called left voicemail message asking whether patient need to attend there FCE prior to there work conditioning or work conditioning prior to [employer]. The number to contact [employer] is [phone]   Case # [id number]

## 2017-07-26 NOTE — Telephone Encounter (Signed)
FCE after work conditioning. Then follow up with me

## 2017-07-29 NOTE — Telephone Encounter (Signed)
Left vm on general vm advising

## 2017-08-10 ENCOUNTER — Telehealth: Payer: Self-pay | Admitting: Physician Assistant

## 2017-08-10 NOTE — Telephone Encounter (Signed)
New Message  Pt voiced wanting to know if he needs to fast for tomorrow and wants a nurse to call back to confirm.  Please f/u

## 2017-08-10 NOTE — Telephone Encounter (Signed)
Spoke with patient it has been over a year since lab work so yes patient should come fasting. He is aware

## 2017-08-11 ENCOUNTER — Ambulatory Visit (INDEPENDENT_AMBULATORY_CARE_PROVIDER_SITE_OTHER): Payer: 59 | Admitting: Physician Assistant

## 2017-08-11 ENCOUNTER — Encounter: Payer: Self-pay | Admitting: Physician Assistant

## 2017-08-11 VITALS — BP 118/82 | HR 61 | Temp 98.1°F | Resp 14 | Ht 70.0 in | Wt 198.6 lb

## 2017-08-11 DIAGNOSIS — F411 Generalized anxiety disorder: Secondary | ICD-10-CM

## 2017-08-11 DIAGNOSIS — G4700 Insomnia, unspecified: Secondary | ICD-10-CM | POA: Diagnosis not present

## 2017-08-11 DIAGNOSIS — Z1159 Encounter for screening for other viral diseases: Secondary | ICD-10-CM

## 2017-08-11 DIAGNOSIS — K219 Gastro-esophageal reflux disease without esophagitis: Secondary | ICD-10-CM | POA: Diagnosis not present

## 2017-08-11 DIAGNOSIS — E785 Hyperlipidemia, unspecified: Secondary | ICD-10-CM

## 2017-08-11 DIAGNOSIS — I10 Essential (primary) hypertension: Secondary | ICD-10-CM | POA: Diagnosis not present

## 2017-08-11 DIAGNOSIS — E559 Vitamin D deficiency, unspecified: Secondary | ICD-10-CM | POA: Diagnosis not present

## 2017-08-11 DIAGNOSIS — H539 Unspecified visual disturbance: Secondary | ICD-10-CM | POA: Diagnosis not present

## 2017-08-11 DIAGNOSIS — Z114 Encounter for screening for human immunodeficiency virus [HIV]: Secondary | ICD-10-CM

## 2017-08-11 MED ORDER — ESCITALOPRAM OXALATE 20 MG PO TABS: 20.0000 mg | ORAL_TABLET | Freq: Every day | ORAL | 3 refills | Status: DC

## 2017-08-11 NOTE — Progress Notes (Signed)
Patient ID: Curtis Lucas MRN: [id number], DOB: 03-Nov-1961 56 y.o. Date of Encounter: 08/11/2017, 11:18 AM    Chief Complaint: Routine office visit and labs  HPI: 56 y.o. y/o male here for routine office visit and labs.   I did review the fact that some of his recent office visit notes were regarding visual disturbance. I reviewed the note by neurology that is in the computer. Patient reports that in addition to seeing neurologist he also saw a second ophthalmologist for another opinion/evaluation. He says that no one could ever come up with a definite diagnosis and he says that he is still having the symptoms.  08/11/2017:  Right Bicep Tendon Rupture: At his last visit with me 01/20/17 he reported that he had injured his right arm at work the prior week. He told me that there was a box that weighed over 100 pounds on a pallet. Was trying to tilt it up. Suddenly heard a loud pop. Since then had been having pain around the brachial aspect of the elbow region and also pain up towards the right shoulder. I informed him that our was quite certain he had a bicep tendon rupture/tear and that this needed to be managed by orthopedics. He did have follow-up with orthopedics and has continued to have follow-up with them. Today he reports that he is still out of work. Reports that he is still doing therapy. States that he will have to do an evaluation to see if he can return to his prior job.  Anxiety/ OCD: He had OV with Dr. Dennard Schaumann 12/04/16. At that visit he reported that he worries incessantly about things. Will check things 3-4 times to reassure himself that they had been done. If he did not check them 3 or 4 times this would trigger a panic attack. Reported daily anxiety that was "driving him crazy ". Would then have a difficult time breathing. Would feel that the room was closing in on him. Also reported insomnia. Would lie awake thinking over things at night keeping him awake. He reported  that in the past he has taken Lexapro and symptoms improved dramatically.  At that visit was started on Lexapro 10 mg daily. At his visit with me 01/20/17 he felt that the Lexapro 10 mg dose was working well. Had noticed significant improvement in his symptoms. However today at visit 08/11/17 he reports that his wife feels that the dose of Lexapro needs to be increased. He does agree that he does continue to worry about everything. Does feel that he worries more than necessary and more than "normal".  Hypertension: He is taking medications as directed. He has no adverse effects. No lower extremity edema. No lightheadedness.  Hyperlipidemia: He is taking pravastatin 80 mg as directed. He has no myalgias and no other adverse effects.  Hyperglycemia: At prior office visit he told me he was eating lots of bread and also lots of diet [facility] use. He is still on drinking a diet [facility] but is not eating much bread.  GERD: He says that he only needs the Protonix as needed. He is no longer taking that on a daily basis.  Insomnia: He says he only needs a half of an Ambien but he does need to take this medicine every night otherwise cannot sleep.  No other complaints or concerns.    Review of Systems: Consitutional: No fever, chills, fatigue, night sweats, lymphadenopathy, or weight changes. Eyes: No  eye redness, or discharge. ENT/Mouth: Ears:  No otalgia, tinnitus, hearing loss, discharge. Nose: No congestion, rhinorrhea, sinus pain, or epistaxis. Throat: No sore throat, post nasal drip, or teeth pain. Cardiovascular: No CP, palpitations, diaphoresis, DOE, edema, orthopnea, PND. Respiratory: No cough, hemoptysis, SOB, or wheezing. Gastrointestinal: No anorexia, dysphagia, reflux, pain, nausea, vomiting, hematemesis, diarrhea, constipation, BRBPR, or melena. Genitourinary: No dysuria, frequency, urgency, hematuria, incontinence, nocturia, decreased urinary stream, discharge, impotence, or  testicular pain/masses. Musculoskeletal: No decreased ROM, myalgias, stiffness, joint swelling, or weakness. Skin: No rash, erythema, lesion changes, pain, warmth, jaundice, or pruritis. Neurological: No headache, dizziness, syncope, seizures, tremors, memory loss, coordination problems, or paresthesias. Psychological: No anxiety, depression, hallucinations, SI/HI. Endocrine: No fatigue, polydipsia, polyphagia, polyuria, or known diabetes. All other systems were reviewed and are otherwise negative.  Past Medical History:  Diagnosis Date  . Allergy    Rhinitis  . Anxiety   . Arthritis   . Biceps rupture, distal, right, initial encounter   . CVA (cerebral vascular accident) ([place])   . Depression   . Difficulty sleeping   . Elevated lipids   . GERD (gastroesophageal reflux disease)   . Hyperglycemia   . Hyperlipidemia   . Hypertension   . Insomnia      Past Surgical History:  Procedure Laterality Date  . COLONOSCOPY  09/24/2003   patterson--normal per pt.  [name] DISTAL BICEPS TENDON REPAIR Right 02/24/2017   Procedure: RIGHT DISTAL BICEPS TENDON REPAIR;  Surgeon: Leandrew Koyanagi, MD;  Location: [facility];  Service: Orthopedics;  Laterality: Right;  . HEAD INJURY   1987  . JOINT REPLACEMENT    . LUMBAR EPIDURAL INJECTION    . RECTAL SURGERY  2006  . TOTAL HIP ARTHROPLASTY    . TOTAL HIP ARTHROPLASTY  05/03/2012   Procedure: TOTAL HIP ARTHROPLASTY ANTERIOR APPROACH;  Surgeon: Mauri Pole, MD;  [facility];  Service: Orthopedics;  Laterality: Right;    Home Meds:  Outpatient Medications Prior to Visit  Medication Sig Dispense Refill  . amLODipine (NORVASC) 10 MG tablet TAKE 1 TABLET EVERY DAY 90 tablet 1  . aspirin 81 MG tablet Take 81 mg by mouth daily.    [name] atenolol (TENORMIN) 100 MG tablet TAKE 1 TABLET BY MOUTH EVERY DAY 90 tablet 0  . diclofenac (VOLTAREN) 75 MG EC tablet TAKE 1 TABLET (75 MG TOTAL) BY MOUTH 2 (TWO) TIMES DAILY. 30 tablet 2  . escitalopram  (LEXAPRO) 10 MG tablet TAKE 1 TABLET (10 MG TOTAL) BY MOUTH DAILY. 30 tablet 5  . meloxicam (MOBIC) 7.5 MG tablet TAKE 1 TABLET BY MOUTH EVERY DAY WITH FOOD 90 tablet 1  . Omega-3 Fatty Acids (FISH OIL) 1200 MG CAPS Take 1 capsule by mouth daily.    . pantoprazole (PROTONIX) 40 MG tablet TAKE 1 TABLET BY MOUTH EVERY DAY 90 tablet 1  . pravastatin (PRAVACHOL) 80 MG tablet TAKE 1 TABLET EVERY DAY 90 tablet 1  . zolpidem (AMBIEN) 10 MG tablet TAKE 1 TABLET BY MOUTH AT BEDTIME 30 tablet 0  . atenolol (TENORMIN) 100 MG tablet TAKE 1 TABLET BY MOUTH EVERY DAY 90 tablet 1  . HYDROcodone-acetaminophen (NORCO/VICODIN) 5-325 MG tablet Take 1 tablet by mouth every 6 (six) hours as needed. (Patient not taking: Reported on 07/22/2017) 60 tablet 0  . methocarbamol (ROBAXIN) 750 MG tablet Take 1 tablet (750 mg total) by mouth 2 (two) times daily as needed for muscle spasms. 60 tablet 0  . ondansetron (ZOFRAN) 4 MG tablet Take 1-2 tablets (4-8 mg total) by  mouth every 8 (eight) hours as needed for nausea or vomiting. (Patient not taking: Reported on 07/22/2017) 40 tablet 0  . oxyCODONE-acetaminophen (PERCOCET) 5-325 MG tablet Take 1-2 tablets by mouth every 4 (four) hours as needed for severe pain. (Patient not taking: Reported on 07/22/2017) 90 tablet 0  . oxyCODONE-acetaminophen (PERCOCET/ROXICET) 5-325 MG tablet Take 1 tablet by mouth every 6 (six) hours as needed for severe pain. (Patient not taking: Reported on 07/22/2017) 30 tablet 0  . promethazine (PHENERGAN) 25 MG tablet Take 1 tablet (25 mg total) by mouth every 6 (six) hours as needed for nausea. (Patient not taking: Reported on 07/22/2017) 30 tablet 1  . senna-docusate (SENOKOT S) 8.6-50 MG tablet Take 1 tablet by mouth at bedtime as needed. (Patient not taking: Reported on 07/22/2017) 30 tablet 1   No facility-administered medications prior to visit.     Allergies:  Allergies  Allergen Reactions  . Lipitor [Atorvastatin Calcium]     Severe muscle spasms     Social History   Social History  . Marital status: Married    Spouse name: Jackelyn Poling  . Number of children: 0  . 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 Smoking status: Never Smoker  . Smokeless tobacco: Never Used  . Alcohol use Yes     Comment: 4-5 BEERS PER WK  . Drug use: No  . Sexual activity: Yes   Other Topics Concern  . Not on file   Social History Narrative   Patient lives at home with spouse.   Caffeine Use:4 16oz Mt. Dew sodas daily    Family History  Problem Relation Age of Onset  . Stroke Mother   . Cancer Mother        colon  . Dementia Mother   . Colon cancer Mother   . Parkinson's disease Father   . Heart attack Paternal Grandfather   . Dementia Paternal Grandmother   . Colon polyps Neg Hx   . Esophageal cancer Neg Hx   . Rectal cancer Neg Hx   . Stomach cancer Neg Hx     Physical Exam: Blood pressure 118/82, pulse 61, temperature 98.1 F (36.7 C), temperature source Oral, resp. rate 14, height 5\' 10"  (1.778 m), weight 198 lb 9.6 oz (90.1 kg), SpO2 97 %.  General: Well developed, well nourished,WM. Appears in no acute distress. Neck: Supple. Trachea midline. No thyromegaly. Full ROM. No lymphadenopathy. Lungs: Clear to auscultation bilaterally without wheezes, rales, or rhonchi. Breathing is of normal effort and unlabored. Cardiovascular: RRR with S1 S2. No murmurs, rubs, or gallops. Distal pulses 2+ symmetrically. No carotid or abdominal bruits. Abdomen: Soft, non-tender, non-distended with normoactive bowel sounds. No hepatosplenomegaly or masses. No rebound/guarding. No CVA tenderness. No hernias. Musculoskeletal: Right arm: There is light/fading yellow ecchymosis distal to elbow near brachial area. There is tenderness in this region and at the right shoulder.  Skin: Warm and moist without erythema, ecchymosis, wounds, or rash. Neuro: A+Ox3. CN II-XII grossly  intact. Moves all extremities spontaneously. Full sensation throughout. Normal gait.  Psych:  Responds to questions appropriately with a normal affect.   Assessment/Plan:  56 y.o. y/o  [place] male here for    Generalized anxiety disorder Will increase Lexapro from 10 mg to 20 mg. Discussed with him that it will take up to 4-6 weeks for him to notice complete effect of increasing dose. If develops  any adverse effects then call me and can decrease dose back down to 10 mg.  Prostate cancer screening Last PSA was normal---01/20/2017   Elevated lipids On pravastatin 80 mg. Recheck labs now.   Hypertension Blood pressure is at goal. Continue current medications and check labs monitor. - COMPLETE METABOLIC PANEL WITH GFR   Vitamin D deficiency Lab 05/02/15---Vitiman D level was low at 17----was treated with repletion.-- level checked 06/2016 and was up to normal range at 43. Continue current dose of over-the-counter vitamin D -  GERD (gastroesophageal reflux disease) Continue to use PPI when necessary.  Insomnia Continue taking one half of an Ambien each bedtime when necessary  Hyperglycemia This has been managed with diet changes.  Last Hemoglobin A1c---07/16/2016---was normal at --5.5----will not recheck now. Will wait and recheck only if his fasting glucose levels increase.  CVA (cerebral vascular accident)-- History of occluded left ICA/left CT A. secondary to trauma from an accident  S/P right THA, AA-- He had a left total hip replacement in May 2012 and right hip total replacement May 2013. As well he has had lumbar epidural injections in February 2012. Today he states that he's been having no pain whatsoever" feels like he is 57 years old ". He requires no type of medications for pain whatsoever finally.  Visual disturbance See HPI.   Has had evaluation with neurologist and ophthalmologist and no etiology could be determined. Pt reports the symptoms persist.  Screening for HIV  (human immunodeficiency virus) Checked 08/11/2017 - HIV antibody  Need for hepatitis C screening test Checked 08/11/2017 - Hepatitis C antibody    THE FOLLOWING IS COPIED FROM CPE 05/02/2015: -1. Visit for preventive health examination  A. Screening Labs: - CBC with Differential/Platelet - COMPLETE METABOLIC PANEL WITH GFR - Lipid panel - PSA - Vit D  25 hydroxy (rtn osteoporosis monitoring) - TSH   B. Screening For Prostate Cancer: - PSA  C.  Screening for colorectal cancer He had screening colonoscopy 09/24/2003. This was normal. Was due to repeat 09/2013. At OVs with me around 09/2013--we discussed but he deferred.   In the past patient had deferred colonoscopy but today he is agreeable for me to go ahead and refer him to GI to have this performed. - Ambulatory referral to Gastroenterology  ADDENDUM--Added 01/15/2016:  At [place] 01/15/16 asked patient if he followed up with colonoscopy. He says that at the time of his appointment was scheduled he was having to work a lot of overtime at work so was unable to go to that appointment. Says things have continued to be very busy at work. Says currently one employee is out with hip surgery. Says that when he things calm down at work and schedule permits he will call me and I will place another order for this.  At visit 07/16/16 he reports that he never did go see GI or have his colonoscopy. Is that his wife did finally have her screening colonoscopy and he needs to have his done. Wants me to go ahead and order another referral. Says that he will go to his appointment this time and that his work schedule is not overly busy right now and that he will be able to go to the appointment now. - Ambulatory referral to Gastroenterology  At [place] 08/11/2017----he reports that he did finally go have his colonoscopy and is glad that he did. Says that it did show a precancerous polyp.  D.  Immunizations: Flu-----------------------N/A Tetanus----------------he received T dap 04/20/2014 Pneumococcal-------he has  no indication to need pneumonia vaccine until age 20 Shingrix----------------this is currently on back order. We'll discuss with him once this is available.   Routine office visit in 6 months or sooner if needed.     Signed:   [address] [place], [place]  08/11/2017 11:18 AM

## 2017-08-12 LAB — COMPLETE METABOLIC PANEL WITH GFR
ALBUMIN: 4.5 g/dL (ref 3.6–5.1)
ALT: 40 U/L (ref 9–46)
AST: 28 U/L (ref 10–35)
Alkaline Phosphatase: 70 U/L (ref 40–115)
BILIRUBIN TOTAL: 0.7 mg/dL (ref 0.2–1.2)
BUN: 15 mg/dL (ref 7–25)
CALCIUM: 9.4 mg/dL (ref 8.6–10.3)
CO2: 23 mmol/L (ref 20–32)
CREATININE: 0.97 mg/dL (ref 0.70–1.33)
Chloride: 107 mmol/L (ref 98–110)
GFR, Est Non African American: 86 mL/min (ref >=60)
Glucose, Bld: 100 mg/dL — ABNORMAL HIGH (ref 70–99)
Potassium: 5.2 mmol/L (ref 3.5–5.3)
Sodium: 142 mmol/L (ref 135–146)
TOTAL PROTEIN: 7.3 g/dL (ref 6.1–8.1)

## 2017-08-12 LAB — LIPID PANEL
Cholesterol: 217 mg/dL — ABNORMAL HIGH (ref <200)
HDL: 74 mg/dL (ref >40)
LDL CALC: 133 mg/dL — AB (ref <100)
TRIGLYCERIDES: 52 mg/dL (ref <150)
Total CHOL/HDL Ratio: 2.9 Ratio (ref <5.0)
VLDL: 10 mg/dL (ref <30)

## 2017-08-12 LAB — HEPATITIS C ANTIBODY: HCV AB: NONREACTIVE

## 2017-08-12 LAB — HIV ANTIBODY (ROUTINE TESTING W REFLEX): HIV 1&2 Ab, 4th Generation: NONREACTIVE

## 2017-08-13 ENCOUNTER — Other Ambulatory Visit: Payer: Self-pay | Admitting: Family Medicine

## 2017-08-13 NOTE — Telephone Encounter (Signed)
Ok to refill??      LOV 08/11/17 LRF- 07/16/17

## 2017-08-15 NOTE — Telephone Encounter (Signed)
Approved. #30+2. 

## 2017-08-27 ENCOUNTER — Other Ambulatory Visit (INDEPENDENT_AMBULATORY_CARE_PROVIDER_SITE_OTHER): Payer: Self-pay | Admitting: Orthopaedic Surgery

## 2017-08-30 ENCOUNTER — Encounter: Payer: Self-pay | Admitting: Physician Assistant

## 2017-09-13 ENCOUNTER — Ambulatory Visit (INDEPENDENT_AMBULATORY_CARE_PROVIDER_SITE_OTHER): Payer: Worker's Compensation | Admitting: Orthopaedic Surgery

## 2017-09-13 DIAGNOSIS — S46211D Strain of muscle, fascia and tendon of other parts of biceps, right arm, subsequent encounter: Secondary | ICD-10-CM

## 2017-09-13 NOTE — Progress Notes (Signed)
Office Visit Note   Patient: Curtis Lucas           Date of Birth: Feb 09, 1961           MRN: [id number] Visit Date: 09/13/2017              Requested by: Curtis Sheldon, PA-C [address] [place], [place] [address] PCP: Curtis Sheldon, PA-C   Assessment & Plan: Visit Diagnoses:  1. Rupture of right distal biceps tendon, subsequent encounter     Plan: FCE results show that Curtis Lucas should be restricted to light duty for an 8 hour day and 40 hour week with lifting restrictions as detailed in the FCE report. These were reliable results. At this point were going to release him back to work. From my standpoint he has reached maximum medical improvement with a permanent partial impairment of 30% of the right upper extremity. Total face to face encounter time was greater than 25 minutes and over half of this time was spent in counseling and/or coordination of care.  Follow-Up Instructions: Return if symptoms worsen or fail to improve.   Orders:  No orders of the defined types were placed in this encounter.  No orders of the defined types were placed in this encounter.     Procedures: No procedures performed   Clinical Data: No additional findings.   Subjective: Chief Complaint  Patient presents with  . Right Elbow - Follow-up, Pain    Patient follows up today for FCE review. He underwent right distal biceps tendon repair with augmentation with allograft 7 months ago. He is eager to return back to work.    Review of Systems  Constitutional: Negative.   All other systems reviewed and are negative.    Objective: Vital Signs: There were no vitals taken for this visit.  Physical Exam  Constitutional: He is oriented to person, place, and time. He appears well-developed and well-nourished.  Pulmonary/Chest: Effort normal.  Abdominal: Soft.  Neurological: He is alert and oriented to person, place, and time.  Skin: Skin is warm.  Psychiatric: He has a  normal mood and affect. His behavior is normal. Judgment and thought content normal.  Nursing note and vitals reviewed.   Ortho Exam Right elbow exam is stable. Fully healed surgical scar. Specialty Comments:  No specialty comments available.  Imaging: No results found.   PMFS History: Patient Active Problem List   Diagnosis Date Noted  . Olecranon bursitis of left elbow 07/22/2017  . Impingement syndrome of right shoulder 07/08/2017  . Rupture of right distal biceps tendon 02/11/2017  . Generalized anxiety disorder 01/20/2017  . Prostate cancer screening 01/20/2017  . Vitamin D deficiency 05/06/2015  . Visual disturbance 05/02/2015  . Insomnia   . S/P right THA, AA 05/03/2012  . Hypertension   . GERD (gastroesophageal reflux disease)   . CVA (cerebral vascular accident) ([place])   . Allergy   . Elevated lipids   . Hyperglycemia    Past Medical History:  Diagnosis Date  . Allergy    Rhinitis  . Anxiety   . Arthritis   . Biceps rupture, distal, right, initial encounter   . CVA (cerebral vascular accident) ([place])   . Depression   . Difficulty sleeping   . Elevated lipids   . GERD (gastroesophageal reflux disease)   . Hyperglycemia   . Hyperlipidemia   . Hypertension   . Insomnia     Family History  Problem Relation Age  of Onset  . Stroke Mother   . Cancer Mother        colon  . Dementia Mother   . Colon cancer Mother   . Parkinson's disease Father   . Heart attack Paternal Grandfather   . Dementia Paternal Grandmother   . Colon polyps Neg Hx   . Esophageal cancer Neg Hx   . Rectal cancer Neg Hx   . Stomach cancer Neg Hx     Past Surgical History:  Procedure Laterality Date  . COLONOSCOPY  09/24/2003   patterson--normal per pt.  [name] DISTAL BICEPS TENDON REPAIR Right 02/24/2017   Procedure: RIGHT DISTAL BICEPS TENDON REPAIR;  Surgeon: Curtis Koyanagi, MD;  Location: [facility];  Service: Orthopedics;  Laterality: Right;  . HEAD INJURY   1987  .  JOINT REPLACEMENT    . LUMBAR EPIDURAL INJECTION    . RECTAL SURGERY  2006  . TOTAL HIP ARTHROPLASTY    . 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 Smoking status: Never Smoker  . Smokeless tobacco: Never Used  . Alcohol use Yes     Comment: 4-5 BEERS PER WK  . Drug use: No  . Sexual activity: Yes

## 2017-09-22 ENCOUNTER — Telehealth (INDEPENDENT_AMBULATORY_CARE_PROVIDER_SITE_OTHER): Payer: Self-pay | Admitting: Orthopaedic Surgery

## 2017-09-22 NOTE — Telephone Encounter (Signed)
See message below °

## 2017-09-22 NOTE — Telephone Encounter (Signed)
Pt tried to return to work after two days of working he realized his  left shoulder was in a a lot pain   * Please give pt a call if you can  Workers Comp

## 2017-09-22 NOTE — Telephone Encounter (Signed)
He should probably make an appt so we can examine him

## 2017-09-22 NOTE — Telephone Encounter (Signed)
Made appt

## 2017-09-23 ENCOUNTER — Ambulatory Visit (INDEPENDENT_AMBULATORY_CARE_PROVIDER_SITE_OTHER): Payer: Worker's Compensation | Admitting: Orthopaedic Surgery

## 2017-09-23 DIAGNOSIS — S46211D Strain of muscle, fascia and tendon of other parts of biceps, right arm, subsequent encounter: Secondary | ICD-10-CM

## 2017-09-23 MED ORDER — TRAMADOL HCL 50 MG PO TABS: 50.0000 mg | ORAL_TABLET | Freq: Four times a day (QID) | ORAL | 0 refills | Status: DC | PRN

## 2017-09-23 NOTE — Progress Notes (Signed)
Office Visit Note   Patient: Curtis Lucas           Date of Birth: 10/06/1961           MRN: [id number] Visit Date: 09/23/2017              Requested by: Curtis Sheldon, PA-C [address] [place], [place] [address] PCP: Curtis Sheldon, PA-C   Assessment & Plan: Visit Diagnoses:  1. Rupture of right distal biceps tendon, subsequent encounter     Plan: From my standpoint I think he is not appropriate to return back to his previous job. This is to physical for him. I think the patient is very honest and does not exhibit any signs of malingering or secondary gain and is a very trustworthy person therefore I think he will have to find other forms of employment. From my standpoint he has reached MMI with a permanent partial impairment of his right upper extremity of 30%. We will keep him out of work with his current employer indefinitely. Follow-up as needed. Total face to face encounter time was greater than 25 minutes and over half of this time was spent in counseling and/or coordination of care.  Follow-Up Instructions: Return if symptoms worsen or fail to improve.   Orders:  No orders of the defined types were placed in this encounter.  Meds ordered this encounter  Medications  . traMADol (ULTRAM) 50 MG tablet    Sig: Take 1 tablet (50 mg total) by mouth every 6 (six) hours as needed.    Dispense:  30 tablet    Refill:  0      Procedures: No procedures performed   Clinical Data: No additional findings.   Subjective: No chief complaint on file.   Curtis Lucas comes in today for worsening pain of his right upper extremity related to his biceps tendon rupture. He return to light duty but his light duty required him to use an impact gun as well as other duties which aren't too strenuous for him. He denies any numbness and tingling.    Review of Systems   Objective: Vital Signs: There were no vitals taken for this visit.  Physical Exam  Ortho Exam Right elbow  exam is stable. Specialty Comments:  No specialty comments available.  Imaging: No results found.   PMFS History: Patient Active Problem List   Diagnosis Date Noted  . Olecranon bursitis of left elbow 07/22/2017  . Impingement syndrome of right shoulder 07/08/2017  . Rupture of right distal biceps tendon 02/11/2017  . Generalized anxiety disorder 01/20/2017  . Prostate cancer screening 01/20/2017  . Vitamin D deficiency 05/06/2015  . Visual disturbance 05/02/2015  . Insomnia   . S/P right THA, AA 05/03/2012  . Hypertension   . GERD (gastroesophageal reflux disease)   . CVA (cerebral vascular accident) ([place])   . Allergy   . Elevated lipids   . Hyperglycemia    Past Medical History:  Diagnosis Date  . Allergy    Rhinitis  . Anxiety   . Arthritis   . Biceps rupture, distal, right, initial encounter   . CVA (cerebral vascular accident) ([place])   . Depression   . Difficulty sleeping   . Elevated lipids   . GERD (gastroesophageal reflux disease)   . Hyperglycemia   . Hyperlipidemia   . Hypertension   . Insomnia     Family History  Problem Relation Age of Onset  . Stroke Mother   .  Cancer Mother        colon  . Dementia Mother   . Colon cancer Mother   . Parkinson's disease Father   . Heart attack Paternal Grandfather   . Dementia Paternal Grandmother   . Colon polyps Neg Hx   . Esophageal cancer Neg Hx   . Rectal cancer Neg Hx   . Stomach cancer Neg Hx     Past Surgical History:  Procedure Laterality Date  . COLONOSCOPY  09/24/2003   patterson--normal per pt.  [name] DISTAL BICEPS TENDON REPAIR Right 02/24/2017   Procedure: RIGHT DISTAL BICEPS TENDON REPAIR;  Surgeon: Leandrew Koyanagi, MD;  Location: [facility];  Service: Orthopedics;  Laterality: Right;  . HEAD INJURY   1987  . JOINT REPLACEMENT    . LUMBAR EPIDURAL INJECTION    . RECTAL SURGERY  2006  . TOTAL HIP ARTHROPLASTY    . TOTAL HIP ARTHROPLASTY  05/03/2012   Procedure: TOTAL HIP  ARTHROPLASTY ANTERIOR APPROACH;  Surgeon: Mauri Pole, MD;  [facility];  Service: Orthopedics;  Laterality: Right;   Social History   Occupational History  .  [employer], clinical   Social History Main Topics  . Smoking status: Never Smoker  . Smokeless tobacco: Never Used  . Alcohol use Yes     Comment: 4-5 BEERS PER WK  . Drug use: No  . Sexual activity: Yes

## 2017-09-27 ENCOUNTER — Ambulatory Visit (INDEPENDENT_AMBULATORY_CARE_PROVIDER_SITE_OTHER): Payer: Self-pay | Admitting: Orthopaedic Surgery

## 2017-09-27 ENCOUNTER — Telehealth (INDEPENDENT_AMBULATORY_CARE_PROVIDER_SITE_OTHER): Payer: Self-pay

## 2017-09-27 NOTE — Telephone Encounter (Signed)
Faxed the 10/*4/18 office and work note to case mgr per her request

## 2017-09-30 ENCOUNTER — Encounter: Payer: Self-pay | Admitting: Physician Assistant

## 2017-10-21 ENCOUNTER — Telehealth (INDEPENDENT_AMBULATORY_CARE_PROVIDER_SITE_OTHER): Payer: Self-pay

## 2017-10-21 NOTE — Telephone Encounter (Signed)
Faxed completed 25r with rating of 30% (arm) marked to [employer] [phone]

## 2017-10-28 ENCOUNTER — Telehealth (INDEPENDENT_AMBULATORY_CARE_PROVIDER_SITE_OTHER): Payer: Self-pay

## 2017-10-28 NOTE — Telephone Encounter (Signed)
Faxed the 09/13/17 office and work note to [facility] adj per her request

## 2017-11-15 ENCOUNTER — Other Ambulatory Visit: Payer: Self-pay | Admitting: Physician Assistant

## 2017-11-20 ENCOUNTER — Other Ambulatory Visit: Payer: Self-pay | Admitting: Physician Assistant

## 2017-11-25 ENCOUNTER — Other Ambulatory Visit (INDEPENDENT_AMBULATORY_CARE_PROVIDER_SITE_OTHER): Payer: Self-pay | Admitting: Orthopaedic Surgery

## 2017-11-25 NOTE — Telephone Encounter (Signed)
Rx request 

## 2017-12-05 ENCOUNTER — Other Ambulatory Visit: Payer: Self-pay | Admitting: Physician Assistant

## 2017-12-05 DIAGNOSIS — F411 Generalized anxiety disorder: Secondary | ICD-10-CM

## 2017-12-23 ENCOUNTER — Other Ambulatory Visit: Payer: Self-pay | Admitting: Physician Assistant

## 2017-12-23 DIAGNOSIS — F411 Generalized anxiety disorder: Secondary | ICD-10-CM

## 2017-12-23 NOTE — Telephone Encounter (Signed)
Last OV 08/11/2017 LAST REFILL 08/16/2017 Ok to refill Ambien? And lexapro 08/11/2017

## 2017-12-23 NOTE — Telephone Encounter (Signed)
Approved to refill both of these medications for #30+5 additional refills.

## 2017-12-24 NOTE — Telephone Encounter (Signed)
rx called into pharmacy

## 2017-12-29 ENCOUNTER — Telehealth (INDEPENDENT_AMBULATORY_CARE_PROVIDER_SITE_OTHER): Payer: Self-pay | Admitting: Orthopaedic Surgery

## 2017-12-29 NOTE — Telephone Encounter (Signed)
Patient called asking for a cd with all of the xrays and a MRI report included, if it could possibly be picked up tomorrow. CB # [phone]

## 2017-12-29 NOTE — Telephone Encounter (Signed)
Patient aware CD is ready for pickup °

## 2018-01-14 ENCOUNTER — Other Ambulatory Visit: Payer: Self-pay | Admitting: Physician Assistant

## 2018-01-18 ENCOUNTER — Other Ambulatory Visit: Payer: Self-pay | Admitting: Physician Assistant

## 2018-01-22 ENCOUNTER — Other Ambulatory Visit (INDEPENDENT_AMBULATORY_CARE_PROVIDER_SITE_OTHER): Payer: Self-pay | Admitting: Orthopaedic Surgery

## 2018-02-08 ENCOUNTER — Ambulatory Visit: Payer: 59

## 2018-02-08 VITALS — BP 130/90

## 2018-02-08 DIAGNOSIS — Z0130 Encounter for examination of blood pressure without abnormal findings: Secondary | ICD-10-CM

## 2018-02-08 NOTE — Progress Notes (Signed)
Patient was in office for blood pressure check  At home patient had a reading of 156/99 so came into office for a check.Patient was informed of his blood pressure readings and was told to keep his  upcoming appointment with PCP.

## 2018-02-12 ENCOUNTER — Other Ambulatory Visit: Payer: Self-pay | Admitting: Physician Assistant

## 2018-02-14 ENCOUNTER — Ambulatory Visit (INDEPENDENT_AMBULATORY_CARE_PROVIDER_SITE_OTHER): Payer: 59 | Admitting: Physician Assistant

## 2018-02-14 ENCOUNTER — Encounter: Payer: Self-pay | Admitting: Physician Assistant

## 2018-02-14 ENCOUNTER — Other Ambulatory Visit: Payer: Self-pay

## 2018-02-14 ENCOUNTER — Ambulatory Visit: Payer: Self-pay | Admitting: Physician Assistant

## 2018-02-14 VITALS — BP 134/84 | HR 67 | Temp 98.1°F | Resp 14 | Ht 70.0 in | Wt 210.8 lb

## 2018-02-14 DIAGNOSIS — E785 Hyperlipidemia, unspecified: Secondary | ICD-10-CM | POA: Diagnosis not present

## 2018-02-14 DIAGNOSIS — G4700 Insomnia, unspecified: Secondary | ICD-10-CM | POA: Diagnosis not present

## 2018-02-14 DIAGNOSIS — I10 Essential (primary) hypertension: Secondary | ICD-10-CM

## 2018-02-14 DIAGNOSIS — Z125 Encounter for screening for malignant neoplasm of prostate: Secondary | ICD-10-CM | POA: Diagnosis not present

## 2018-02-14 DIAGNOSIS — K219 Gastro-esophageal reflux disease without esophagitis: Secondary | ICD-10-CM

## 2018-02-14 MED ORDER — TRAMADOL HCL 50 MG PO TABS: 50.0000 mg | ORAL_TABLET | Freq: Four times a day (QID) | ORAL | 0 refills | Status: DC | PRN

## 2018-02-14 NOTE — Progress Notes (Signed)
Patient ID: Curtis Lucas MRN: [id number], DOB: [birth year] 57 y.o. Date of Encounter: 02/14/2018, 8:50 AM    Chief Complaint: Routine office visit and labs  HPI: 57 y.o. y/o male here for routine office visit and labs.   I did review the fact that some of his recent office visit notes were regarding visual disturbance. I reviewed the note by neurology that is in the computer. Patient reports that in addition to seeing neurologist he also saw a second ophthalmologist for another opinion/evaluation. He says that no one could ever come up with a definite diagnosis and he says that he is still having the symptoms.  08/11/2017:  Right Bicep Tendon Rupture: At his last visit with me 01/20/17 he reported that he had injured his right arm at work the prior week. He told me that there was a box that weighed over 100 pounds on a pallet. Was trying to tilt it up. Suddenly heard a loud pop. Since then had been having pain around the brachial aspect of the elbow region and also pain up towards the right shoulder. I informed him that our was quite certain he had a bicep tendon rupture/tear and that this needed to be managed by orthopedics. He did have follow-up with orthopedics and has continued to have follow-up with them. Today he reports that he is still out of work. Reports that he is still doing therapy. States that he will have to do an evaluation to see if he can return to his prior job.  Anxiety/ OCD: He had OV with Dr. Dennard Schaumann 12/04/16. At that visit he reported that he worries incessantly about things. Will check things 3-4 times to reassure himself that they had been done. If he did not check them 3 or 4 times this would trigger a panic attack. Reported daily anxiety that was "driving him crazy ". Would then have a difficult time breathing. Would feel that the room was closing in on him. Also reported insomnia. Would lie awake thinking over things at night keeping him awake. He reported  that in the past he has taken Lexapro and symptoms improved dramatically.  At that visit was started on Lexapro 10 mg daily. At his visit with me 01/20/17 he felt that the Lexapro 10 mg dose was working well. Had noticed significant improvement in his symptoms. However today at visit 08/11/17 he reports that his wife feels that the dose of Lexapro needs to be increased. He does agree that he does continue to worry about everything. Does feel that he worries more than necessary and more than "normal".  Hypertension: He is taking medications as directed. He has no adverse effects. No lower extremity edema. No lightheadedness.  Hyperlipidemia: He is taking pravastatin 80 mg as directed. He has no myalgias and no other adverse effects.  Hyperglycemia: At prior office visit he told me he was eating lots of bread and also lots of diet [facility] use. He is still on drinking a diet [facility] but is not eating much bread.  GERD: He says that he only needs the Protonix as needed. He is no longer taking that on a daily basis.  Insomnia: He says he only needs a half of an Ambien but he does need to take this medicine every night otherwise cannot sleep.  No other complaints or concerns.   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  He tried to go back to work but was unable to  do his job.  Dates that anything that in requires heavy lifting or repetitive strength of the right bicep he is unable to do.  Says that even something like trying to crank the lawnmower if he has to pull that more than 4 or 5 times he can not do that because that is all strength is not there and fatigues very quickly. Asked if I will prescribe tramadol for him to have available if needed.  States that he does not have any further follow-up scheduled with Dr. Erlinda Hong.  States that Dr.Xu had been prescribing tramadol and that he very rarely uses it only as needed but would like to continue to have it available as  in case he needs it. Otherwise states that the remainder of his medical issues and health have been stable.  No other specific concerns to address today. Hypertension: He is taking medications as directed. He has no adverse effects. No lower extremity edema. No lightheadedness. Hyperlipidemia: He is taking pravastatin 80 mg as directed. He has no myalgias and no other adverse effects. Hyperglycemia: At prior office visit he told me he was eating lots of bread and also lots of diet [facility] use. He is still on drinking a diet [facility] but is not eating much bread. GERD: He says that he only needs the Protonix as needed. He is no longer taking that on a daily basis. Insomnia: He says he only needs a half of an Ambien but he does need to take this medicine every night otherwise cannot sleep.    Review of Systems: Consitutional: No fever, chills, fatigue, night sweats, lymphadenopathy, or weight changes. Eyes: No  eye redness, or discharge. ENT/Mouth: Ears: No otalgia, tinnitus, hearing loss, discharge. Nose: No congestion, rhinorrhea, sinus pain, or epistaxis. Throat: No sore throat, post nasal drip, or teeth pain. Cardiovascular: No CP, palpitations, diaphoresis, DOE, edema, orthopnea, PND. Respiratory: No cough, hemoptysis, SOB, or wheezing. Gastrointestinal: No anorexia, dysphagia, reflux, pain, nausea, vomiting, hematemesis, diarrhea, constipation, BRBPR, or melena. Genitourinary: No dysuria, frequency, urgency, hematuria, incontinence, nocturia, decreased urinary stream, discharge, impotence, or testicular pain/masses. Musculoskeletal: No decreased ROM, myalgias, stiffness, joint swelling, or weakness. Skin: No rash, erythema, lesion changes, pain, warmth, jaundice, or pruritis. Neurological: No headache, dizziness, syncope, seizures, tremors, memory loss, coordination problems, or paresthesias. Psychological: No anxiety, depression, hallucinations, SI/HI. Endocrine: No fatigue,  polydipsia, polyphagia, polyuria, or known diabetes. All other systems were reviewed and are otherwise negative.  Past Medical History:  Diagnosis Date  . Allergy    Rhinitis  . Anxiety   . Arthritis   . Biceps rupture, distal, right, initial encounter   . CVA (cerebral vascular accident) ([place])   . Depression   . Difficulty sleeping   . Elevated lipids   . GERD (gastroesophageal reflux disease)   . Hyperglycemia   . Hyperlipidemia   . Hypertension   . Insomnia      Past Surgical History:  Procedure Laterality Date  . COLONOSCOPY  09/24/2003   [facility]--normal per pt.  [name] DISTAL BICEPS TENDON REPAIR Right 02/24/2017   Procedure: RIGHT DISTAL BICEPS TENDON REPAIR;  Surgeon: Leandrew Koyanagi, MD;  Location: [place];  Service: Orthopedics;  Laterality: Right;  . HEAD INJURY   1987  . JOINT REPLACEMENT    . LUMBAR EPIDURAL INJECTION    . RECTAL SURGERY  2006  . TOTAL HIP ARTHROPLASTY    . TOTAL HIP ARTHROPLASTY  05/03/2012   Procedure: TOTAL HIP ARTHROPLASTY ANTERIOR APPROACH;  Surgeon: Mauri Pole, MD;  Location: [facility];  Service: Orthopedics;  Laterality: Right;    Home Meds:  Outpatient Medications Prior to Visit  Medication Sig Dispense Refill  . amLODipine (NORVASC) 10 MG tablet TAKE 1 TABLET BY MOUTH EVERY DAY 90 tablet 1  . aspirin 81 MG tablet Take 81 mg by mouth daily.    [name] atenolol (TENORMIN) 100 MG tablet TAKE 1 TABLET BY MOUTH EVERY DAY 90 tablet 0  . diclofenac (VOLTAREN) 75 MG EC tablet TAKE 1 TABLET BY MOUTH TWICE A DAY 30 tablet 2  . escitalopram (LEXAPRO) 20 MG tablet TAKE 1 TABLET BY MOUTH EVERY DAY 30 tablet 5  . meloxicam (MOBIC) 7.5 MG tablet TAKE 1 TABLET BY MOUTH EVERY DAY WITH FOOD 90 tablet 1  . Omega-3 Fatty Acids (FISH OIL) 1200 MG CAPS Take 1 capsule by mouth daily.    . pantoprazole (PROTONIX) 40 MG tablet TAKE 1 TABLET BY MOUTH EVERY DAY 30 tablet 0  . pravastatin (PRAVACHOL) 80 MG tablet TAKE 1 TABLET EVERY DAY 90 tablet 1  .  traMADol (ULTRAM) 50 MG tablet Take 1 tablet (50 mg total) by mouth every 6 (six) hours as needed. 30 tablet 0  . zolpidem (AMBIEN) 10 MG tablet TAKE 1 TABLET AT BEDTIME 30 tablet 5   No facility-administered medications prior to visit.     Allergies:  Allergies  Allergen Reactions  . Lipitor [Atorvastatin Calcium]     Severe muscle spasms    Social History   Socioeconomic History  . Marital status: Married    Spouse name: Jackelyn Poling  . Number of children: 0  . Years of education: HS  . Highest education level: Not on file  Social Needs  . Financial resource strain: Not on file  . Food insecurity - worry: Not on file  . Food insecurity - inability: Not on file  . Transportation needs - medical: Not on file  . Transportation needs - non-medical: Not on file  Occupational History    Employer: [employer]    Comment: Product manager  Tobacco Use  . Smoking status: Never Smoker  . Smokeless tobacco: Never Used  Substance and Sexual Activity  . Alcohol use: Yes    Comment: 4-5 BEERS PER WK  . Drug use: No  . Sexual activity: Yes  Other Topics Concern  . Not on file  Social History Narrative   Patient lives at home with spouse.   Caffeine Use:4 16oz Mt. Dew sodas daily    Family History  Problem Relation Age of Onset  . Stroke Mother   . Cancer Mother        colon  . Dementia Mother   . Colon cancer Mother   . Parkinson's disease Father   . Heart attack Paternal Grandfather   . Dementia Paternal Grandmother   . Colon polyps Neg Hx   . Esophageal cancer Neg Hx   . Rectal cancer Neg Hx   . Stomach cancer Neg Hx     Physical Exam: Blood pressure 134/84, pulse 67, temperature 98.1 F (36.7 C), temperature source Oral, resp. rate 14, height 5\' 10"  (1.778 m), weight 95.6 kg (210 lb 12.8 oz), SpO2 97 %.  General: Well developed, well nourished,WM. Appears in no acute distress. Neck: Supple. Trachea midline. No thyromegaly. Full ROM. No  lymphadenopathy. Lungs: Clear to auscultation bilaterally without wheezes, rales, or rhonchi. Breathing is of normal effort and unlabored. Cardiovascular: RRR with S1 S2. No murmurs, rubs, or  gallops. Distal pulses 2+ symmetrically. No carotid or abdominal bruits. Abdomen: Soft, non-tender, non-distended with normoactive bowel sounds. No hepatosplenomegaly or masses. No rebound/guarding. No CVA tenderness. No hernias. Musculoskeletal: Right arm: There is light/fading yellow ecchymosis distal to elbow near brachial area. There is tenderness in this region and at the right shoulder.  Skin: Warm and moist without erythema, ecchymosis, wounds, or rash. Neuro: A+Ox3. CN II-XII grossly intact. Moves all extremities spontaneously. Full sensation throughout. Normal gait.  Psych:  Responds to questions appropriately with a normal affect.   Assessment/Plan:  57 y.o. y/o  White male here for    Generalized anxiety disorder 02/14/2018: Stable/controlled.  Continue current dose of Lexapro.  Prostate cancer screening Last PSA was normal---01/20/2017 02/14/2018: Recheck screening PSA now.  Elevated lipids 02/14/2018:On pravastatin 80 mg. Recheck labs now.   Hypertension 02/14/2018:Blood pressure is at goal. Continue current medications and check labs monitor. - COMPLETE METABOLIC PANEL WITH GFR   Vitamin D deficiency Lab 05/02/15---Vitiman D level was low at 17----was treated with repletion.-- level checked 06/2016 and was up to normal range at 43. Continue current dose of over-the-counter vitamin D -  GERD (gastroesophageal reflux disease) 02/14/2018:Continue to use PPI when necessary.  Insomnia 02/14/2018:Continue taking one half of an Ambien each bedtime when necessary  Hyperglycemia This has been managed with diet changes.  Last Hemoglobin A1c---07/16/2016---was normal at --5.5----will not recheck now. Will wait and recheck only if his fasting glucose levels increase.  CVA (cerebral vascular  accident)-- History of occluded left ICA/left CT A. secondary to trauma from an accident  S/P right THA, AA-- He had a left total hip replacement in May 2012 and right hip total replacement May 2013. As well he has had lumbar epidural injections in February 2012. Today he states that he's been having no pain whatsoever" feels like he is 57 years old ". He requires no type of medications for pain whatsoever finally.  Visual disturbance See HPI.   Has had evaluation with neurologist and ophthalmologist and no etiology could be determined. Pt reports the symptoms persist.    THE FOLLOWING IS COPIED FROM CPE 05/02/2015: -1. Visit for preventive health examination  A. Screening Labs: - CBC with Differential/Platelet - COMPLETE METABOLIC PANEL WITH GFR - Lipid panel - PSA - Vit D  25 hydroxy (rtn osteoporosis monitoring) - TSH   B. Screening For Prostate Cancer: - PSA  C.  Screening for colorectal cancer He had screening colonoscopy 09/24/2003. This was normal. Was due to repeat 09/2013. At [facility] with me around 09/2013--we discussed but he deferred.   In the past patient had deferred colonoscopy but today he is agreeable for me to go ahead and refer him to GI to have this performed. - Ambulatory referral to Gastroenterology  ADDENDUM--Added 01/15/2016:  At [place] 01/15/16 asked patient if he followed up with colonoscopy. He says that at the time of his appointment was scheduled he was having to work a lot of overtime at work so was unable to go to that appointment. Says things have continued to be very busy at work. Says currently one employee is out with hip surgery. Says that when he things calm down at work and schedule permits he will call me and I will place another order for this.  At visit 07/16/16 he reports that he never did go see GI or have his colonoscopy. Is that his wife did finally have her screening colonoscopy and he needs to have his done. Wants me to go ahead  and order another  referral. Says that he will go to his appointment this time and that his work schedule is not overly busy right now and that he will be able to go to the appointment now. - Ambulatory referral to Gastroenterology  At [place] 08/11/2017----he reports that he did finally go have his colonoscopy and is glad that he did. Says that it did show a precancerous polyp.  D. Immunizations: Flu-----------------------N/A Tetanus----------------he received T dap 04/20/2014 Pneumococcal-------he has no indication to need pneumonia vaccine until age 32 Shingrix----------------this is currently on back order. We'll discuss with him once this is available.   Routine office visit in 6 months or sooner if needed.     Signed:   [address] [place], [place]  02/14/2018 8:50 AM

## 2018-02-14 NOTE — Telephone Encounter (Signed)
Refill appropriate 

## 2018-02-15 LAB — COMPLETE METABOLIC PANEL WITH GFR
AG Ratio: 1.4 (calc) (ref 1.0–2.5)
ALKALINE PHOSPHATASE (APISO): 78 U/L (ref 40–115)
ALT: 37 U/L (ref 9–46)
AST: 24 U/L (ref 10–35)
Albumin: 4.2 g/dL (ref 3.6–5.1)
BUN: 10 mg/dL (ref 7–25)
CO2: 27 mmol/L (ref 20–32)
CREATININE: 0.99 mg/dL (ref 0.70–1.33)
Calcium: 9.3 mg/dL (ref 8.6–10.3)
Chloride: 104 mmol/L (ref 98–110)
GFR, Est African American: 97 mL/min/{1.73_m2} (ref >=60)
GFR, Est Non African American: 84 mL/min/{1.73_m2} (ref >=60)
GLOBULIN: 2.9 g/dL (ref 1.9–3.7)
GLUCOSE: 119 mg/dL — AB (ref 65–99)
Potassium: 5.1 mmol/L (ref 3.5–5.3)
SODIUM: 140 mmol/L (ref 135–146)
Total Bilirubin: 0.3 mg/dL (ref 0.2–1.2)
Total Protein: 7.1 g/dL (ref 6.1–8.1)

## 2018-02-15 LAB — LIPID PANEL
CHOL/HDL RATIO: 4.4 (calc) (ref <5.0)
CHOLESTEROL: 211 mg/dL — AB (ref <200)
HDL: 48 mg/dL (ref >40)
LDL Cholesterol (Calc): 136 mg/dL (calc) — ABNORMAL HIGH
Non-HDL Cholesterol (Calc): 163 mg/dL (calc) — ABNORMAL HIGH (ref <130)
TRIGLYCERIDES: 144 mg/dL (ref <150)

## 2018-02-15 LAB — PSA: PSA: 0.5 ng/mL (ref <=4.0)

## 2018-02-15 LAB — EXTRA LAV TOP TUBE

## 2018-03-12 ENCOUNTER — Other Ambulatory Visit: Payer: Self-pay | Admitting: Physician Assistant

## 2018-03-14 NOTE — Telephone Encounter (Signed)
Refill appropriate 

## 2018-03-22 ENCOUNTER — Other Ambulatory Visit: Payer: Self-pay | Admitting: Physician Assistant

## 2018-03-23 NOTE — Telephone Encounter (Signed)
Last OV 2/25 Last refill 02/14/2018 Ok to refill?

## 2018-03-26 ENCOUNTER — Other Ambulatory Visit: Payer: Self-pay | Admitting: Physician Assistant

## 2018-04-06 ENCOUNTER — Other Ambulatory Visit: Payer: Self-pay | Admitting: Physician Assistant

## 2018-04-18 ENCOUNTER — Other Ambulatory Visit: Payer: Self-pay | Admitting: Physician Assistant

## 2018-04-18 ENCOUNTER — Other Ambulatory Visit: Payer: Self-pay

## 2018-04-18 DIAGNOSIS — F411 Generalized anxiety disorder: Secondary | ICD-10-CM

## 2018-04-18 NOTE — Telephone Encounter (Signed)
Refill was denied the last refill was 01/03/2018 with 5 refills refill request to soon.

## 2018-04-18 NOTE — Telephone Encounter (Signed)
Last OV 02/14/2018 Last refill 03/23/2018 Ok to refill?

## 2018-04-28 ENCOUNTER — Other Ambulatory Visit: Payer: Self-pay

## 2018-04-28 DIAGNOSIS — F411 Generalized anxiety disorder: Secondary | ICD-10-CM

## 2018-04-28 MED ORDER — ESCITALOPRAM OXALATE 20 MG PO TABS: 20.0000 mg | ORAL_TABLET | Freq: Every day | ORAL | 5 refills | Status: DC

## 2018-05-10 ENCOUNTER — Other Ambulatory Visit: Payer: Self-pay | Admitting: Physician Assistant

## 2018-05-23 ENCOUNTER — Other Ambulatory Visit: Payer: Self-pay | Admitting: Physician Assistant

## 2018-05-25 ENCOUNTER — Other Ambulatory Visit: Payer: Self-pay

## 2018-05-25 MED ORDER — TRAMADOL HCL 50 MG PO TABS: 50.0000 mg | ORAL_TABLET | Freq: Four times a day (QID) | ORAL | 0 refills | Status: DC | PRN

## 2018-05-25 NOTE — Telephone Encounter (Signed)
LAST OV 02/14/2018 LAST REFILL 04/18/2018 Ok to refill?

## 2018-06-01 ENCOUNTER — Other Ambulatory Visit: Payer: Self-pay | Admitting: Physician Assistant

## 2018-06-07 ENCOUNTER — Other Ambulatory Visit: Payer: Self-pay | Admitting: Physician Assistant

## 2018-06-15 ENCOUNTER — Other Ambulatory Visit: Payer: Self-pay

## 2018-06-15 ENCOUNTER — Other Ambulatory Visit: Payer: Self-pay | Admitting: Physician Assistant

## 2018-06-15 MED ORDER — PANTOPRAZOLE SODIUM 40 MG PO TBEC: 40.0000 mg | DELAYED_RELEASE_TABLET | Freq: Every day | ORAL | 1 refills | Status: DC

## 2018-06-18 ENCOUNTER — Other Ambulatory Visit: Payer: Self-pay | Admitting: Physician Assistant

## 2018-06-20 ENCOUNTER — Other Ambulatory Visit: Payer: Self-pay | Admitting: Physician Assistant

## 2018-06-25 ENCOUNTER — Other Ambulatory Visit: Payer: Self-pay | Admitting: Family Medicine

## 2018-06-27 NOTE — Telephone Encounter (Signed)
Ok to refill??  Last office visit 02/14/2018.  Last refill 05/25/2018.

## 2018-07-18 ENCOUNTER — Other Ambulatory Visit: Payer: Self-pay | Admitting: Physician Assistant

## 2018-07-18 NOTE — Telephone Encounter (Signed)
Ok to refill??  Last office visit 02/14/2018.  Last refill 12/24/2017, #5 refills.

## 2018-07-20 ENCOUNTER — Other Ambulatory Visit: Payer: Self-pay | Admitting: Physician Assistant

## 2018-07-20 NOTE — Telephone Encounter (Signed)
Ok to refill??  Last office visit 02/14/2018.  Last refill 07/18/2018, #5 refills.

## 2018-07-22 ENCOUNTER — Other Ambulatory Visit: Payer: Self-pay | Admitting: Physician Assistant

## 2018-07-22 DIAGNOSIS — F411 Generalized anxiety disorder: Secondary | ICD-10-CM

## 2018-08-15 ENCOUNTER — Other Ambulatory Visit: Payer: Self-pay

## 2018-08-15 ENCOUNTER — Encounter: Payer: Self-pay | Admitting: Physician Assistant

## 2018-08-15 ENCOUNTER — Ambulatory Visit: Payer: Self-pay | Admitting: Physician Assistant

## 2018-08-15 VITALS — BP 128/82 | HR 67 | Temp 98.9°F | Resp 16 | Ht 70.0 in | Wt 208.6 lb

## 2018-08-15 DIAGNOSIS — F411 Generalized anxiety disorder: Secondary | ICD-10-CM

## 2018-08-15 DIAGNOSIS — G4700 Insomnia, unspecified: Secondary | ICD-10-CM

## 2018-08-15 DIAGNOSIS — K219 Gastro-esophageal reflux disease without esophagitis: Secondary | ICD-10-CM

## 2018-08-15 DIAGNOSIS — I10 Essential (primary) hypertension: Secondary | ICD-10-CM

## 2018-08-15 DIAGNOSIS — E785 Hyperlipidemia, unspecified: Secondary | ICD-10-CM

## 2018-08-15 LAB — COMPLETE METABOLIC PANEL WITH GFR
AG Ratio: 1.4 (calc) (ref 1.0–2.5)
ALKALINE PHOSPHATASE (APISO): 72 U/L (ref 40–115)
ALT: 26 U/L (ref 9–46)
AST: 29 U/L (ref 10–35)
Albumin: 4.1 g/dL (ref 3.6–5.1)
BUN: 11 mg/dL (ref 7–25)
CO2: 27 mmol/L (ref 20–32)
CREATININE: 0.9 mg/dL (ref 0.70–1.33)
Calcium: 9.5 mg/dL (ref 8.6–10.3)
Chloride: 102 mmol/L (ref 98–110)
GFR, EST NON AFRICAN AMERICAN: 94 mL/min/{1.73_m2} (ref >=60)
GFR, Est African American: 109 mL/min/{1.73_m2} (ref >=60)
GLOBULIN: 3 g/dL (ref 1.9–3.7)
Glucose, Bld: 112 mg/dL — ABNORMAL HIGH (ref 65–99)
Potassium: 4.7 mmol/L (ref 3.5–5.3)
SODIUM: 138 mmol/L (ref 135–146)
Total Bilirubin: 0.7 mg/dL (ref 0.2–1.2)
Total Protein: 7.1 g/dL (ref 6.1–8.1)

## 2018-08-15 LAB — LIPID PANEL
CHOL/HDL RATIO: 4.2 (calc) (ref <5.0)
CHOLESTEROL: 213 mg/dL — AB (ref <200)
HDL: 51 mg/dL (ref >40)
LDL Cholesterol (Calc): 132 mg/dL (calc) — ABNORMAL HIGH
NON-HDL CHOLESTEROL (CALC): 162 mg/dL — AB (ref <130)
Triglycerides: 160 mg/dL — ABNORMAL HIGH (ref <150)

## 2018-08-15 NOTE — Progress Notes (Signed)
Patient ID: Curtis Lucas MRN: [id number], DOB: 08-Feb-1961 57 y.o. Date of Encounter: 08/15/2018, 8:05 AM    Chief Complaint: Routine office visit and labs  HPI: 57 y.o. y/o male here for routine office visit and labs.   I did review the fact that some of his recent office visit notes were regarding visual disturbance. I reviewed the note by neurology that is in the computer. Patient reports that in addition to seeing neurologist he also saw a second ophthalmologist for another opinion/evaluation. He says that no one could ever come up with a definite diagnosis and he says that he is still having the symptoms.  08/11/2017:  Right Bicep Tendon Rupture: At his last visit with me 01/20/17 he reported that he had injured his right arm at work the prior week. He told me that there was a box that weighed over 100 pounds on a pallet. Was trying to tilt it up. Suddenly heard a loud pop. Since then had been having pain around the brachial aspect of the elbow region and also pain up towards the right shoulder. I informed him that our was quite certain he had a bicep tendon rupture/tear and that this needed to be managed by orthopedics. He did have follow-up with orthopedics and has continued to have follow-up with them. Today he reports that he is still out of work. Reports that he is still doing therapy. States that he will have to do an evaluation to see if he can return to his prior job.  Anxiety/ OCD: He had OV with Dr. Dennard Schaumann 12/04/16. At that visit he reported that he worries incessantly about things. Will check things 3-4 times to reassure himself that they had been done. If he did not check them 3 or 4 times this would trigger a panic attack. Reported daily anxiety that was "driving him crazy ". Would then have a difficult time breathing. Would feel that the room was closing in on him. Also reported insomnia. Would lie awake thinking over things at night keeping him awake. He reported  that in the past he has taken Lexapro and symptoms improved dramatically.  At that visit was started on Lexapro 10 mg daily. At his visit with me 01/20/17 he felt that the Lexapro 10 mg dose was working well. Had noticed significant improvement in his symptoms. However today at visit 08/11/17 he reports that his wife feels that the dose of Lexapro needs to be increased. He does agree that he does continue to worry about everything. Does feel that he worries more than necessary and more than "normal".  Hypertension: He is taking medications as directed. He has no adverse effects. No lower extremity edema. No lightheadedness.  Hyperlipidemia: He is taking pravastatin 80 mg as directed. He has no myalgias and no other adverse effects.  Hyperglycemia: At prior office visit he told me he was eating lots of bread and also lots of diet [facility] use. He is still on drinking a diet [facility] but is not eating much bread.  GERD: He says that he only needs the Protonix as needed. He is no longer taking that on a daily basis.  Insomnia: He says he only needs a half of an Ambien but he does need to take this medicine every night otherwise cannot sleep.  No other complaints or concerns.   02/14/2018: He reports that Dr. Erlinda Hong "Took me out of work"--- workers comp.  He tried to go back to work but was unable  to do his job.  States that anything that requires heavy lifting or repetitive strength of the right bicep he is unable to do.  Says that even something like trying to crank the lawnmower if he has to pull that more than 4 or 5 times he can not do that because all strength is not there and fatigues very quickly. Asks if I will prescribe tramadol for him to have available if needed.  States that he does not have any further follow-up scheduled with Dr. Erlinda Hong.  States that Dr.Xu had been prescribing tramadol and that he very rarely uses it only as needed but would like to continue to have it available as in  case he needs it. Otherwise states that the remainder of his medical issues and health have been stable.  No other specific concerns to address today. Hypertension: He is taking medications as directed. He has no adverse effects. No lower extremity edema. No lightheadedness. Hyperlipidemia: He is taking pravastatin 80 mg as directed. He has no myalgias and no other adverse effects. Hyperglycemia: At prior office visit he told me he was eating lots of bread and also lots of diet [employer]. He is still drinking a diet [facility] but is not eating much bread. GERD: He says that he only needs the Protonix as needed. He is no longer taking that on a daily basis. Insomnia: He says he only needs a half of an Ambien but he does need to take this medicine every night otherwise cannot sleep.   08/15/2018: Today he reports that things with him have been stable.  Reports the same issues with his right bicep as he did at last visit.  Asked what he does to keep himself busy.  Says that he is mostly out in the yard with his dog or tinkering with his lawnmower etc. or over at his buddies. He has no specific concerns to address today.  Is fasting to check labs. He currently has no insurance and is self-pay so I am coding level 3. Hypertension: He is taking medications as directed. He has no adverse effects. No lower extremity edema. No lightheadedness. Hyperlipidemia: He is taking pravastatin 80 mg as directed. He has no myalgias and no other adverse effects. Hyperglycemia: At prior office visit he told me he was eating lots of bread and also lots of diet [employer]. He is still drinking a diet [facility] but is not eating much bread. GERD: He says that he only needs the Protonix as needed. He is no longer taking that on a daily basis. Insomnia: He says he only needs a half of an Ambien but he does need to take this medicine every night otherwise cannot sleep.     Review of Systems: Consitutional: No  fever, chills, fatigue, night sweats, lymphadenopathy, or weight changes. Eyes: No  eye redness, or discharge. ENT/Mouth: Ears: No otalgia, tinnitus, hearing loss, discharge. Nose: No congestion, rhinorrhea, sinus pain, or epistaxis. Throat: No sore throat, post nasal drip, or teeth pain. Cardiovascular: No CP, palpitations, diaphoresis, DOE, edema, orthopnea, PND. Respiratory: No cough, hemoptysis, SOB, or wheezing. Gastrointestinal: No anorexia, dysphagia, reflux, pain, nausea, vomiting, hematemesis, diarrhea, constipation, BRBPR, or melena. Genitourinary: No dysuria, frequency, urgency, hematuria, incontinence, nocturia, decreased urinary stream, discharge, impotence, or testicular pain/masses. Musculoskeletal: No decreased ROM, myalgias, stiffness, joint swelling, or weakness. Skin: No rash, erythema, lesion changes, pain, warmth, jaundice, or pruritis. Neurological: No headache, dizziness, syncope, seizures, tremors, memory loss, coordination problems, or paresthesias. Psychological: No  anxiety, depression, hallucinations, SI/HI. Endocrine: No fatigue, polydipsia, polyphagia, polyuria, or known diabetes. All other systems were reviewed and are otherwise negative.  Past Medical History:  Diagnosis Date  . Allergy    Rhinitis  . Anxiety   . Arthritis   . Biceps rupture, distal, right, initial encounter   . CVA (cerebral vascular accident) ([place])   . Depression   . Difficulty sleeping   . Elevated lipids   . GERD (gastroesophageal reflux disease)   . Hyperglycemia   . Hyperlipidemia   . Hypertension   . Insomnia      Past Surgical History:  Procedure Laterality Date  . COLONOSCOPY  09/24/2003   patterson--normal per pt.  [name] DISTAL BICEPS TENDON REPAIR Right 02/24/2017   Procedure: RIGHT DISTAL BICEPS TENDON REPAIR;  Surgeon: Leandrew Koyanagi, MD;  Location: [place];  Service: Orthopedics;  Laterality: Right;  . HEAD INJURY   1987  . JOINT REPLACEMENT    . LUMBAR  EPIDURAL INJECTION    . RECTAL SURGERY  2006  . TOTAL HIP ARTHROPLASTY    . TOTAL HIP ARTHROPLASTY  05/03/2012   Procedure: TOTAL HIP ARTHROPLASTY ANTERIOR APPROACH;  Surgeon: Mauri Pole, MD;  Location: [facility];  Service: Orthopedics;  Laterality: Right;    Home Meds:  Outpatient Medications Prior to Visit  Medication Sig Dispense Refill  . amLODipine (NORVASC) 10 MG tablet TAKE ONE TABLET BY MOUTH DAILY 30 tablet 0  . aspirin 81 MG tablet Take 81 mg by mouth daily.    [name] atenolol (TENORMIN) 100 MG tablet TAKE 1 TABLET BY MOUTH EVERY DAY 90 tablet 0  . diclofenac (VOLTAREN) 75 MG EC tablet TAKE 1 TABLET BY MOUTH TWICE A DAY 30 tablet 2  . escitalopram (LEXAPRO) 20 MG tablet TAKE ONE TABLET BY MOUTH DAILY 30 tablet 0  . meloxicam (MOBIC) 7.5 MG tablet TAKE 1 TABLET BY MOUTH EVERY DAY WITH FOOD 90 tablet 1  . Omega-3 Fatty Acids (FISH OIL) 1200 MG CAPS Take 1 capsule by mouth daily.    . pantoprazole (PROTONIX) 40 MG tablet TAKE ONE TABLET BY MOUTH DAILY 30 tablet 0  . pravastatin (PRAVACHOL) 80 MG tablet TAKE 1 TABLET EVERY DAY 90 tablet 1  . traMADol (ULTRAM) 50 MG tablet TAKE 1 TABLET BY MOUTH EVERY 6 HOURS AS NEEDED 30 tablet 0  . zolpidem (AMBIEN) 10 MG tablet TAKE 1 TABLET EVERY NIGHT AT BEDTIME AS NEEDED FOR SLEEP 30 tablet 5   No facility-administered medications prior to visit.     Allergies:  Allergies  Allergen Reactions  . Lipitor [Atorvastatin Calcium]     Severe muscle spasms    Social History   Socioeconomic History  . Marital status: Married    Spouse name: Jackelyn Poling  . Number of children: 0  . Years of education: HS  . Highest education level: Not on file  Occupational History    Employer: [employer]    Comment: Product manager  Social Needs  . Financial resource strain: Not on file  . Food insecurity:    Worry: Not on file    Inability: Not on file  . Transportation needs:    Medical: Not on file    Non-medical: Not on file  Tobacco Use  .  Smoking status: Never Smoker  . Smokeless tobacco: Never Used  Substance and Sexual Activity  . Alcohol use: Yes    Comment: 4-5 BEERS PER WK  . Drug use: No  . Sexual  activity: Yes  Lifestyle  . Physical activity:    Days per week: Not on file    Minutes per session: Not on file  . Stress: Not on file  Relationships  . Social connections:    Talks on phone: Not on file    Gets together: Not on file    Attends religious service: Not on file    Active member of club or organization: Not on file    Attends meetings of clubs or organizations: Not on file    Relationship status: Not on file  . Intimate partner violence:    Fear of current or ex partner: Not on file    Emotionally abused: Not on file    Physically abused: Not on file    Forced sexual activity: Not on file  Other Topics Concern  . Not on file  Social History Narrative   Patient lives at home with spouse.   Caffeine Use:4 16oz Mt. Dew sodas daily    Family History  Problem Relation Age of Onset  . Stroke Mother   . Cancer Mother        colon  . Dementia Mother   . Colon cancer Mother   . Parkinson's disease Father   . Heart attack Paternal Grandfather   . Dementia Paternal Grandmother   . Colon polyps Neg Hx   . Esophageal cancer Neg Hx   . Rectal cancer Neg Hx   . Stomach cancer Neg Hx     Physical Exam: Blood pressure 128/82, pulse 67, temperature 98.9 F (37.2 C), temperature source Oral, resp. rate 16, height 5\' 10"  (1.778 m), weight 94.6 kg, SpO2 95 %., Body mass index is 29.93 kg/m. General: WNWD WM. Appears in no acute distress. Neck: Supple. No thyromegaly. No lymphadenopathy.No carotid bruits. Lungs: Clear bilaterally to auscultation without wheezes, rales, or rhonchi. Breathing is unlabored. Heart: RRR with S1 S2. No murmurs, rubs, or gallops. Abdomen: Soft, non-tender, non-distended with normoactive bowel sounds. No hepatomegaly. No rebound/guarding. No obvious abdominal  masses. Musculoskeletal:  Strength and tone normal for age. Extremities/Skin: Warm and dry. No LE edema.  Neuro: Alert and oriented X 3. Moves all extremities spontaneously. Gait is normal. CNII-XII grossly in tact. Psych:  Responds to questions appropriately with a normal affect.    Assessment/Plan:  57 y.o. y/o  White male here for    Generalized anxiety disorder 08/15/2018: Stable/controlled.  Continue current dose of Lexapro.  Prostate cancer screening Last PSA was normal---01/20/2017 02/14/2018: Recheck screening PSA now. 08/15/2018: Last PSA 02/14/2018.  Recheck annually.  Elevated lipids 08/15/2018:On pravastatin 80 mg. Recheck labs now.   Hypertension 08/15/2018:Blood pressure is at goal. Continue current medications and check labs monitor. - COMPLETE METABOLIC PANEL WITH GFR   Vitamin D deficiency Lab 05/02/15---Vitiman D level was low at 17----was treated with repletion.-- level checked 06/2016 and was up to normal range at 43. Continue current dose of over-the-counter vitamin D -  GERD (gastroesophageal reflux disease) 08/15/2018:Continue to use PPI when necessary.  Insomnia 08/15/2018:Continue taking one half of an Ambien each bedtime when necessary  Hyperglycemia This has been managed with diet changes.  Last Hemoglobin A1c---07/16/2016---was normal at --5.5----will not recheck now. Will wait and recheck only if his fasting glucose levels increase.  CVA (cerebral vascular accident)-- History of occluded left ICA/left CT A. secondary to trauma from an accident  S/P right THA, AA-- He had a left total hip replacement in May 2012 and right hip total replacement May 2013. As well  he has had lumbar epidural injections in February 2012. Today he states that he's been having no pain whatsoever" feels like he is 57 years old ". He requires no type of medications for pain whatsoever finally.  Visual disturbance See HPI.   Has had evaluation with neurologist and ophthalmologist  and no etiology could be determined. Pt reports the symptoms persist.    THE FOLLOWING IS COPIED FROM CPE 05/02/2015: -1. Visit for preventive health examination  A. Screening Labs: - CBC with Differential/Platelet - COMPLETE METABOLIC PANEL WITH GFR - Lipid panel - PSA - Vit D  25 hydroxy (rtn osteoporosis monitoring) - TSH   B. Screening For Prostate Cancer: - PSA  C.  Screening for colorectal cancer He had screening colonoscopy 09/24/2003. This was normal. Was due to repeat 09/2013. At [facility] with me around 09/2013--we discussed but he deferred.   In the past patient had deferred colonoscopy but today he is agreeable for me to go ahead and refer him to GI to have this performed. - Ambulatory referral to Gastroenterology  ADDENDUM--Added 01/15/2016:  At [place] 01/15/16 asked patient if he followed up with colonoscopy. He says that at the time of his appointment was scheduled he was having to work a lot of overtime at work so was unable to go to that appointment. Says things have continued to be very busy at work. Says currently one employee is out with hip surgery. Says that when he things calm down at work and schedule permits he will call me and I will place another order for this.  At visit 07/16/16 he reports that he never did go see GI or have his colonoscopy. Is that his wife did finally have her screening colonoscopy and he needs to have his done. Wants me to go ahead and order another referral. Says that he will go to his appointment this time and that his work schedule is not overly busy right now and that he will be able to go to the appointment now. - Ambulatory referral to Gastroenterology  At [place] 08/11/2017----he reports that he did finally go have his colonoscopy and is glad that he did. Says that it did show a precancerous polyp.  D. Immunizations: Flu-----------------------N/A Tetanus----------------he received T dap 04/20/2014 Pneumococcal-------he has no indication to need  pneumonia vaccine until age 30 Shingrix----------------this is currently on back order. We'll discuss with him once this is available.   Routine office visit in 6 months or sooner if needed.     Signed:   [address] [place], [place]  08/15/2018 8:05 AM

## 2018-08-19 ENCOUNTER — Other Ambulatory Visit: Payer: Self-pay | Admitting: Physician Assistant

## 2018-08-19 DIAGNOSIS — F411 Generalized anxiety disorder: Secondary | ICD-10-CM

## 2018-08-20 ENCOUNTER — Other Ambulatory Visit: Payer: Self-pay | Admitting: Physician Assistant

## 2018-09-01 ENCOUNTER — Other Ambulatory Visit: Payer: Self-pay | Admitting: Physician Assistant

## 2018-09-10 ENCOUNTER — Other Ambulatory Visit: Payer: Self-pay | Admitting: Physician Assistant

## 2018-09-12 NOTE — Telephone Encounter (Signed)
Last OV 08/15/2018 Last refill 06/27/2018 Ok to refill?

## 2018-09-21 ENCOUNTER — Other Ambulatory Visit: Payer: Self-pay | Admitting: Physician Assistant

## 2018-09-21 DIAGNOSIS — F411 Generalized anxiety disorder: Secondary | ICD-10-CM

## 2018-10-09 ENCOUNTER — Other Ambulatory Visit: Payer: Self-pay | Admitting: Physician Assistant

## 2018-10-24 ENCOUNTER — Other Ambulatory Visit: Payer: Self-pay | Admitting: Physician Assistant

## 2018-10-24 DIAGNOSIS — F411 Generalized anxiety disorder: Secondary | ICD-10-CM

## 2018-10-28 ENCOUNTER — Other Ambulatory Visit: Payer: Self-pay | Admitting: Physician Assistant

## 2018-11-15 ENCOUNTER — Other Ambulatory Visit: Payer: Self-pay | Admitting: Family Medicine

## 2018-11-24 ENCOUNTER — Other Ambulatory Visit: Payer: Self-pay | Admitting: Family Medicine

## 2018-11-24 DIAGNOSIS — F411 Generalized anxiety disorder: Secondary | ICD-10-CM

## 2018-11-29 ENCOUNTER — Other Ambulatory Visit: Payer: Self-pay | Admitting: Family Medicine

## 2018-11-29 ENCOUNTER — Other Ambulatory Visit: Payer: Self-pay | Admitting: *Deleted

## 2018-11-29 MED ORDER — PRAVASTATIN SODIUM 80 MG PO TABS: 80.0000 mg | ORAL_TABLET | Freq: Every day | ORAL | 3 refills | Status: DC

## 2018-12-18 ENCOUNTER — Other Ambulatory Visit: Payer: Self-pay | Admitting: Family Medicine

## 2018-12-23 ENCOUNTER — Other Ambulatory Visit: Payer: Self-pay | Admitting: Family Medicine

## 2018-12-23 NOTE — Telephone Encounter (Signed)
Ok to refill??  Last office visit 08/15/2018.  Last refill 11/01/2018.

## 2018-12-28 ENCOUNTER — Other Ambulatory Visit: Payer: Self-pay | Admitting: Family Medicine

## 2018-12-28 DIAGNOSIS — F411 Generalized anxiety disorder: Secondary | ICD-10-CM

## 2018-12-28 MED ORDER — ATENOLOL 100 MG PO TABS: 100.0000 mg | ORAL_TABLET | Freq: Every day | ORAL | 0 refills | Status: DC

## 2018-12-28 NOTE — Telephone Encounter (Signed)
Prescription sent to pharmacy.

## 2018-12-28 NOTE — Telephone Encounter (Signed)
Refill on atenolol to [facility]

## 2019-01-29 ENCOUNTER — Other Ambulatory Visit: Payer: Self-pay | Admitting: Family Medicine

## 2019-01-29 DIAGNOSIS — F411 Generalized anxiety disorder: Secondary | ICD-10-CM

## 2019-01-30 ENCOUNTER — Other Ambulatory Visit: Payer: Self-pay | Admitting: Family Medicine

## 2019-01-30 MED ORDER — ZOLPIDEM TARTRATE 10 MG PO TABS: ORAL_TABLET | ORAL | 5 refills | Status: DC

## 2019-02-04 ENCOUNTER — Other Ambulatory Visit: Payer: Self-pay | Admitting: Family Medicine

## 2019-02-05 ENCOUNTER — Other Ambulatory Visit: Payer: Self-pay | Admitting: Family Medicine

## 2019-02-06 NOTE — Telephone Encounter (Signed)
Requesting refill    Tramadol  LOV: 08/15/18  LRF:  12/23/18

## 2019-02-06 NOTE — Telephone Encounter (Signed)
Ok to refill??  Last office visit 08/15/2018.  Last refill 12/23/2018.

## 2019-02-13 ENCOUNTER — Telehealth: Payer: Self-pay | Admitting: Family Medicine

## 2019-02-13 NOTE — Telephone Encounter (Signed)
Pharmacy updated.

## 2019-02-13 NOTE — Telephone Encounter (Signed)
Pt new pharmacy is [facility]

## 2019-02-16 ENCOUNTER — Ambulatory Visit: Payer: Self-pay | Admitting: Physician Assistant

## 2019-02-26 ENCOUNTER — Other Ambulatory Visit: Payer: Self-pay | Admitting: Family Medicine

## 2019-02-27 ENCOUNTER — Telehealth: Payer: Self-pay | Admitting: Family Medicine

## 2019-02-27 NOTE — Telephone Encounter (Signed)
[name]  Patient would like  His ambien to be sent to a new pharmacy  [phone] if any questions

## 2019-02-27 NOTE — Telephone Encounter (Signed)
Pt changed pharmacies - please send they will not transfer controlled [place].

## 2019-02-27 NOTE — Telephone Encounter (Signed)
Requesting refill    Tramadol  LOV: 08/15/18  LRF:  02/06/19

## 2019-02-28 ENCOUNTER — Other Ambulatory Visit: Payer: Self-pay | Admitting: Family Medicine

## 2019-02-28 DIAGNOSIS — F411 Generalized anxiety disorder: Secondary | ICD-10-CM

## 2019-02-28 MED ORDER — ZOLPIDEM TARTRATE 10 MG PO TABS: ORAL_TABLET | ORAL | 5 refills | Status: DC

## 2019-03-28 ENCOUNTER — Other Ambulatory Visit: Payer: Self-pay | Admitting: Family Medicine

## 2019-03-30 ENCOUNTER — Other Ambulatory Visit: Payer: Self-pay | Admitting: Family Medicine

## 2019-03-30 DIAGNOSIS — F411 Generalized anxiety disorder: Secondary | ICD-10-CM

## 2019-04-20 ENCOUNTER — Other Ambulatory Visit: Payer: Self-pay | Admitting: Family Medicine

## 2019-04-21 NOTE — Telephone Encounter (Signed)
Requested Prescriptions   Pending Prescriptions Disp Refills  . traMADol (ULTRAM) 50 MG tablet [Pharmacy Med Name: traMADol HCL 50MG  TABLET] 30 tablet 0    Sig: TAKE ONE TABLET BY MOUTH EVERY 6 HOURS AS NEEDED *WILL NEED OFFICE VISIT FOR FURTHER REFILLS*   Last OV 08/15/2018 Last filled 02/27/2019

## 2019-05-02 ENCOUNTER — Encounter: Payer: Self-pay | Admitting: Family Medicine

## 2019-05-02 ENCOUNTER — Other Ambulatory Visit: Payer: Self-pay

## 2019-05-02 ENCOUNTER — Other Ambulatory Visit: Payer: Self-pay | Admitting: Family Medicine

## 2019-05-02 ENCOUNTER — Ambulatory Visit (INDEPENDENT_AMBULATORY_CARE_PROVIDER_SITE_OTHER): Payer: Self-pay | Admitting: Family Medicine

## 2019-05-02 VITALS — BP 130/82 | HR 62 | Temp 98.6°F | Resp 16 | Ht 70.0 in | Wt 215.0 lb

## 2019-05-02 DIAGNOSIS — I10 Essential (primary) hypertension: Secondary | ICD-10-CM

## 2019-05-02 DIAGNOSIS — Z125 Encounter for screening for malignant neoplasm of prostate: Secondary | ICD-10-CM

## 2019-05-02 DIAGNOSIS — K921 Melena: Secondary | ICD-10-CM

## 2019-05-02 DIAGNOSIS — E785 Hyperlipidemia, unspecified: Secondary | ICD-10-CM

## 2019-05-02 DIAGNOSIS — F411 Generalized anxiety disorder: Secondary | ICD-10-CM

## 2019-05-02 NOTE — Progress Notes (Signed)
Subjective:    Patient ID: Curtis Lucas, male    DOB: May 25, 1961, 58 y.o.   MRN: [id number]  HPI   Patient has had 2 episodes of blood in his stool.  The first occurred 1 month ago.  The patient felt severe pain with his bowel movement.  He looked in the toilet bowl.  There was blood in the water.  The toilet tissue also had blood all over the toilet tissue.  There was no blood mixed in with the stool.  It seemed like the bleeding was coming closer to the exit point.  However that occurred 1 time it did not happen again.  However a few days ago, the patient again saw blood in the toilet water after he had a bowel movement.  He has had no pain with defecation this time.  Last colonoscopy was 3 years ago.  At that time they found diverticulosis and 1 polyp that was precancerous.  His gastroenterologist recommended a repeat colonoscopy 3 years which would be now.  Of note, the patient has had some mild left lower quadrant abdominal pain directly in the area one would expect diverticulitis the last 2 or 3 weeks.  This is since subsided.  He denies any fevers or chills presently.  He also has a history of hypertension and is due for fasting lab work and is also due for prostate cancer screening.  On rectal exam today there is no external hemorrhoid.  There is no obvious anal fissure.  There is no palpable perirectal mass.  Prostate is normal. Past Medical History:  Diagnosis Date  . Allergy    Rhinitis  . Anxiety   . Arthritis   . Biceps rupture, distal, right, initial encounter   . CVA (cerebral vascular accident) ([place])   . Depression   . Difficulty sleeping   . Elevated lipids   . GERD (gastroesophageal reflux disease)   . Hyperglycemia   . Hyperlipidemia   . Hypertension   . Insomnia    Past Surgical History:  Procedure Laterality Date  . COLONOSCOPY  09/24/2003   patterson--normal per pt.  [name] DISTAL BICEPS TENDON REPAIR Right 02/24/2017   Procedure: RIGHT DISTAL BICEPS TENDON REPAIR;   Surgeon: Leandrew Koyanagi, MD;  Location: [place];  Service: Orthopedics;  Laterality: Right;  . HEAD INJURY   1987  . JOINT REPLACEMENT    . LUMBAR EPIDURAL INJECTION    . RECTAL SURGERY  2006  . TOTAL HIP ARTHROPLASTY    . TOTAL HIP ARTHROPLASTY  05/03/2012   Procedure: TOTAL HIP ARTHROPLASTY ANTERIOR APPROACH;  Surgeon: Mauri Pole, MD;  Location: [facility];  Service: Orthopedics;  Laterality: Right;   Current Outpatient Medications on File Prior to Visit  Medication Sig Dispense Refill  . amLODipine (NORVASC) 10 MG tablet TAKE ONE TABLET BY MOUTH DAILY. NEED OFFICE VISIT AND LABS BEFORE FURTHER REFILLS 30 tablet 0  . aspirin 81 MG tablet Take 81 mg by mouth daily.    [name] atenolol (TENORMIN) 100 MG tablet TAKE ONE TABLET BY MOUTH DAILY 90 tablet 0  . escitalopram (LEXAPRO) 20 MG tablet TAKE ONE TABLET BY MOUTH DAILY 30 tablet 0  . meloxicam (MOBIC) 7.5 MG tablet TAKE 1 TABLET BY MOUTH EVERY DAY WITH FOOD 90 tablet 1  . Omega-3 Fatty Acids (FISH OIL) 1200 MG CAPS Take 1 capsule by mouth daily.    . pantoprazole (PROTONIX) 40 MG tablet Take 1 tablet (40 mg total) by mouth daily.  90 tablet 1  . pravastatin (PRAVACHOL) 80 MG tablet Take 1 tablet (80 mg total) by mouth daily. 30 tablet 3  . traMADol (ULTRAM) 50 MG tablet TAKE ONE TABLET BY MOUTH EVERY 6 HOURS AS NEEDED 30 tablet 0  . zolpidem (AMBIEN) 10 MG tablet TAKE 1 TABLET EVERY NIGHT AT BEDTIME AS NEEDED FOR SLEEP 30 tablet 5   No current facility-administered medications on file prior to visit.    Allergies  Allergen Reactions  . Lipitor [Atorvastatin Calcium]     Severe muscle spasms   Social History   Socioeconomic History  . Marital status: Married    Spouse name: Jackelyn Poling  . Number of children: 0  . Years of education: HS  . Highest education level: Not on file  Occupational History    Employer: [employer]    Comment: Product manager  Social Needs  . Financial resource strain: Not on file  .  Food insecurity:    Worry: Not on file    Inability: Not on file  . Transportation needs:    Medical: Not on file    Non-medical: Not on file  Tobacco Use  . Smoking status: Never Smoker  . Smokeless tobacco: Never Used  Substance and Sexual Activity  . Alcohol use: Yes    Comment: 4-5 BEERS PER WK  . Drug use: No  . Sexual activity: Yes  Lifestyle  . Physical activity:    Days per week: Not on file    Minutes per session: Not on file  . Stress: Not on file  Relationships  . Social connections:    Talks on phone: Not on file    Gets together: Not on file    Attends religious service: Not on file    Active member of club or organization: Not on file    Attends meetings of clubs or organizations: Not on file    Relationship status: Not on file  . Intimate partner violence:    Fear of current or ex partner: Not on file    Emotionally abused: Not on file    Physically abused: Not on file    Forced sexual activity: Not on file  Other Topics Concern  . Not on file  Social History Narrative   Patient lives at home with spouse.   Caffeine Use:4 16oz Mt. Dew sodas daily      Review of Systems  All other systems reviewed and are negative.      Objective:   Physical Exam Constitutional:      General: He is not in acute distress.    Appearance: Normal appearance. He is normal weight. He is not ill-appearing.  Cardiovascular:     Pulses: Normal pulses.     Heart sounds: Normal heart sounds. No murmur.  Pulmonary:     Effort: Pulmonary effort is normal. No respiratory distress.     Breath sounds: Normal breath sounds. No wheezing, rhonchi or rales.  Abdominal:     General: Bowel sounds are normal. There is no distension.     Palpations: Abdomen is soft.     Tenderness: There is no abdominal tenderness.  Genitourinary:    Prostate: Normal.     Rectum: Normal.  Neurological:     Mental Status: He is alert.           Assessment & Plan:  Essential hypertension  - Plan: CBC with Differential/Platelet, COMPLETE METABOLIC PANEL WITH GFR, Lipid panel  Elevated lipids - Plan: CBC with Differential/Platelet,  COMPLETE METABOLIC PANEL WITH GFR, Lipid panel  Prostate cancer screening - Plan: PSA  Blood in stool  Rectal exam today is normal.  Given his history I am concerned about diverticulosis as a potential cause of his bleeding however they did want to repeat his colonoscopy in 3 years and he is due this year.  Therefore assuming his CBC shows no significant anemia I would call his gastroenterologist and arrange for the patient have a repeat colonoscopy.  If there is a significant drop in his hemoglobin we will need to expedite this as soon as possible.  While checking lab work I will screen the patient for prostate cancer with PSA and also check his cholesterol as well as a CMP.  Patient is comfortable with this plan

## 2019-05-03 ENCOUNTER — Other Ambulatory Visit: Payer: Self-pay | Admitting: Family Medicine

## 2019-05-03 DIAGNOSIS — F411 Generalized anxiety disorder: Secondary | ICD-10-CM

## 2019-05-03 LAB — COMPLETE METABOLIC PANEL WITH GFR
AG Ratio: 1.7 (calc) (ref 1.0–2.5)
ALT: 33 U/L (ref 9–46)
AST: 27 U/L (ref 10–35)
Albumin: 4.3 g/dL (ref 3.6–5.1)
Alkaline phosphatase (APISO): 71 U/L (ref 35–144)
BUN: 11 mg/dL (ref 7–25)
CO2: 27 mmol/L (ref 20–32)
Calcium: 9.4 mg/dL (ref 8.6–10.3)
Chloride: 103 mmol/L (ref 98–110)
Creat: 0.92 mg/dL (ref 0.70–1.33)
GFR, Est African American: 105 mL/min/{1.73_m2} (ref >=60)
GFR, Est Non African American: 91 mL/min/{1.73_m2} (ref >=60)
Globulin: 2.6 g/dL (calc) (ref 1.9–3.7)
Glucose, Bld: 117 mg/dL — ABNORMAL HIGH (ref 65–99)
Potassium: 4.7 mmol/L (ref 3.5–5.3)
Sodium: 140 mmol/L (ref 135–146)
Total Bilirubin: 0.4 mg/dL (ref 0.2–1.2)
Total Protein: 6.9 g/dL (ref 6.1–8.1)

## 2019-05-03 LAB — CBC WITH DIFFERENTIAL/PLATELET
Absolute Monocytes: 620 cells/uL (ref 200–950)
Basophils Absolute: 42 cells/uL (ref 0–200)
Basophils Relative: 0.8 %
Eosinophils Absolute: 111 cells/uL (ref 15–500)
Eosinophils Relative: 2.1 %
HCT: 43.6 % (ref 38.5–50.0)
Hemoglobin: 14.7 g/dL (ref 13.2–17.1)
Lymphs Abs: 1240 cells/uL (ref 850–3900)
MCH: 31.1 pg (ref 27.0–33.0)
MCHC: 33.7 g/dL (ref 32.0–36.0)
MCV: 92.2 fL (ref 80.0–100.0)
MPV: 11.1 fL (ref 7.5–12.5)
Monocytes Relative: 11.7 %
Neutro Abs: 3286 cells/uL (ref 1500–7800)
Neutrophils Relative %: 62 %
Platelets: 284 10*3/uL (ref 140–400)
RBC: 4.73 10*6/uL (ref 4.20–5.80)
RDW: 11.7 % (ref 11.0–15.0)
Total Lymphocyte: 23.4 %
WBC: 5.3 10*3/uL (ref 3.8–10.8)

## 2019-05-03 LAB — PSA: PSA: 0.4 ng/mL (ref <=4.0)

## 2019-05-03 LAB — LIPID PANEL
Cholesterol: 225 mg/dL — ABNORMAL HIGH (ref <200)
HDL: 45 mg/dL (ref >=40)
LDL Cholesterol (Calc): 149 mg/dL (calc) — ABNORMAL HIGH
Non-HDL Cholesterol (Calc): 180 mg/dL (calc) — ABNORMAL HIGH (ref <130)
Total CHOL/HDL Ratio: 5 (calc) — ABNORMAL HIGH (ref <5.0)
Triglycerides: 171 mg/dL — ABNORMAL HIGH (ref <150)

## 2019-05-04 ENCOUNTER — Other Ambulatory Visit: Payer: Self-pay | Admitting: Family Medicine

## 2019-05-04 MED ORDER — ROSUVASTATIN CALCIUM 20 MG PO TABS: 20.0000 mg | ORAL_TABLET | Freq: Every day | ORAL | 1 refills | Status: DC

## 2019-05-10 ENCOUNTER — Telehealth: Payer: Self-pay | Admitting: General Surgery

## 2019-05-10 ENCOUNTER — Encounter: Payer: Self-pay | Admitting: General Surgery

## 2019-05-10 NOTE — Telephone Encounter (Signed)
Called the patient and his wife both on their cell phones to prescreen for virtual visit tomorrow.  LVM for the patient or his wife to call.

## 2019-05-10 NOTE — Telephone Encounter (Signed)
Returned the patients call to pre-screen for 05/11/2019 visit. LVM for the patient to contact the office.

## 2019-05-10 NOTE — Telephone Encounter (Signed)
Pt return call °

## 2019-05-11 ENCOUNTER — Other Ambulatory Visit: Payer: Self-pay

## 2019-05-11 ENCOUNTER — Ambulatory Visit (INDEPENDENT_AMBULATORY_CARE_PROVIDER_SITE_OTHER): Payer: Self-pay | Admitting: Gastroenterology

## 2019-05-11 ENCOUNTER — Encounter: Payer: Self-pay | Admitting: Gastroenterology

## 2019-05-11 DIAGNOSIS — K921 Melena: Secondary | ICD-10-CM

## 2019-05-11 DIAGNOSIS — K529 Noninfective gastroenteritis and colitis, unspecified: Secondary | ICD-10-CM

## 2019-05-11 NOTE — Progress Notes (Signed)
This patient contacted our office requesting a physician telemedicine consultation regarding clinical questions and/or test results.  If new patient, they were referred by Dr. Dennard Schaumann  Participants on the conference : myself and patient   The patient consented to this consultation and was aware that a charge will be placed through their insurance.  I was in my office and the patient was at home   Encounter time:  Total time 31 minutes, with 22 minutes spent with patient on Doximity   _____________________________________________________________________________________________               C[facility] GI Progress Note  Chief Complaint: Hematochezia  Subjective  History: Curtis Lucas was last seen for a routine colonoscopy in September 2017 for family history of colon cancer in his mother.  Left colon diverticulosis and a diminutive transverse colon adenoma were removed, with recommendations for a repeat exam in 5 years.  Primary care note with Dr. Dennard Schaumann on 05/02/2019, describing a couple episodes of rectal bleeding over the last 2 months.  Curtis Lucas says that about 2 months ago he had an episode of rectal bleeding, does not recall that there was any pain with it.  He had another episode about 2 weeks ago, with it was blood in the toilet bowl and paper.  He also does not recall pain with that episode.  He has had perhaps a few months of some intermittent left lower quadrant pain that is brief and nonradiating, no clear triggers or relieving factors.  He has had about a year of diarrhea, which is a definite change for him.  Typically 2-3 semi-formed stools per day, very soon after eating and with some urgency.  Denies heartburn (while on daily PPI) dysphagia, nausea, vomiting, early satiety or weight loss.  ROS: Cardiovascular:  no chest pain Respiratory: no dyspnea Shoulder pain after a work injury in early 2018. Depression that has been manageable on medicine. Insomnia treated with  Ambien  Remainder of systems negative except as above  The patient's Past Medical, Family and Social History were reviewed and are on file in the EMR.  Curtis Lucas had a work-related injury to his shoulder in early 2018, and has been out of work since then, in the process of applying for disability, and currently without medical coverage.  Objective:  Med list reviewed  Current Outpatient Medications:  .  amLODipine (NORVASC) 10 MG tablet, TAKE ONE TABLET BY MOUTH DAILY *NEED OFFICE VISIT AND LABS BEFORE FURTHER REFILLS*, Disp: 30 tablet, Rfl: 0 .  aspirin 81 MG tablet, Take 81 mg by mouth daily., Disp: , Rfl:  .  atenolol (TENORMIN) 100 MG tablet, TAKE ONE TABLET BY MOUTH DAILY, Disp: 90 tablet, Rfl: 0 .  escitalopram (LEXAPRO) 20 MG tablet, TAKE ONE TABLET BY MOUTH DAILY, Disp: 30 tablet, Rfl: 0 .  meloxicam (MOBIC) 7.5 MG tablet, TAKE 1 TABLET BY MOUTH EVERY DAY WITH FOOD, Disp: 90 tablet, Rfl: 1 .  Omega-3 Fatty Acids (FISH OIL) 1200 MG CAPS, Take 1 capsule by mouth daily., Disp: , Rfl:  .  pantoprazole (PROTONIX) 40 MG tablet, Take 1 tablet (40 mg total) by mouth daily., Disp: 90 tablet, Rfl: 1 .  pravastatin (PRAVACHOL) 80 MG tablet, Take 1 tablet (80 mg total) by mouth daily. (Patient not taking: Reported on 05/10/2019), Disp: 30 tablet, Rfl: 3 .  rosuvastatin (CRESTOR) 20 MG tablet, Take 1 tablet (20 mg total) by mouth daily., Disp: 90 tablet, Rfl: 1 .  traMADol (ULTRAM) 50 MG tablet, TAKE ONE TABLET BY  MOUTH EVERY 6 HOURS AS NEEDED, Disp: 30 tablet, Rfl: 0 .  zolpidem (AMBIEN) 10 MG tablet, TAKE 1 TABLET EVERY NIGHT AT BEDTIME AS NEEDED FOR SLEEP, Disp: 30 tablet, Rfl: 5  ( Rare mobic )   No exam-virtual visit. He is ambulatory, pleasant and conversational, well-appearing  Recent Labs:  CBC Latest Ref Rng & Units 05/02/2019 05/02/2015 05/04/2012  WBC 3.8 - 10.8 Thousand/uL 5.3 4.5 14.8(H)  Hemoglobin 13.2 - 17.1 g/dL 14.7 14.8 12.4(L)  Hematocrit 38.5 - 50.0 % 43.6 44.3 36.5(L)   Platelets 140 - 400 Thousand/uL 284 266 213     @ASSESSMENTPLANBEGIN @ Assessment: Encounter Diagnoses  Name Primary?  . Hematochezia Yes  . Chronic diarrhea    Difficult to tell if the diarrhea and bleeding are related to each other, though it would seem unlikely given the 2 isolated episodes of bleeding in a year of diarrhea.  I reassured him that his previous polyp was low risk, and I do not think this is likely to be colorectal cancer causing bleeding.  Cause of diarrhea is unknown at present.  None of his medicines were new with the onset of diarrhea, to the best of his recollection.   Plan: His diarrhea is under control with 2 Imodium tablets twice daily. I recommended he have a colonoscopy both for the bleeding in the diarrhea.  He is agreeable, but not sure how he can do that at this point since he is without medical coverage.  Our office will contact him to put him in touch with [facility] financial services to see how arrangements might be made.   Nelida Meuse III

## 2019-05-16 ENCOUNTER — Telehealth: Payer: Self-pay

## 2019-05-16 DIAGNOSIS — K921 Melena: Secondary | ICD-10-CM

## 2019-05-16 DIAGNOSIS — K529 Noninfective gastroenteritis and colitis, unspecified: Secondary | ICD-10-CM

## 2019-05-16 NOTE — Telephone Encounter (Signed)
Understood, thanks

## 2019-05-16 NOTE — Telephone Encounter (Signed)
-----   Message from Darden Dates sent at 05/16/2019 10:40 AM EDT ----- Regarding: RE: question! Darlina Guys, Well you know beings [facility] does the billing, they don't give [place] exact cost. The only think I have is about $1550 for doctor and facility for Colonoscopy. Anesthesia is about $525.  I'm sure [facility] has a special rate with people without insurance and a payment schedule, but I have no idea about that. Patient assistance form is the best thing you can do for him. Sorry can't help much. Amy ----- Message ----- From: Elias Else, CMA Sent: 05/11/2019   4:10 PM EDT To: Darden Dates Subject: question!                                       Curtis Lucas Male, 58 y.o., 1961-07-28 MRN:  [id number] Phone:  [phone] Jerilynn Mages) PCP:  Susy Frizzle, MD Primary Cvg:  Self Pay Message  Received: Today  Message Contents  Danis, Kirke Corin, MD  Elias Else, CMA    Please review the assessment and plan section of my note. Colonoscopy needs to be scheduled in [place]. However, patient currently without medical coverage. He would like information on approximate cost. It would seem best to put him in touch with our precertification staff and perhaps [place] financial services to see how arrangements might be made. He is currently out of work and in the process of applying for disability.   Hey! Ive mailed him the info for the financial assistance, but can you put something together for me to send him, if he was self pay! Thanks. :)  Vivien Rota

## 2019-05-16 NOTE — Telephone Encounter (Signed)
patient has been notified and aware. He would like to hold on scheduling at the moment  and see about the assistance program first. If symptoms progress he will call back and see what the hospital can offer for self pay.

## 2019-05-18 ENCOUNTER — Other Ambulatory Visit: Payer: Self-pay | Admitting: Family Medicine

## 2019-05-19 NOTE — Telephone Encounter (Signed)
Requested Prescriptions   Pending Prescriptions Disp Refills  . traMADol (ULTRAM) 50 MG tablet [Pharmacy Med Name: traMADol HCL 50MG  TABLET] 30 tablet 0    Sig: TAKE ONE TABLET BY MOUTH EVERY 6 HOURS AS NEEDED *WILL NEED OFFICE VISIT FOR FURTHER REFILLS*   Last OV 05/02/2019 Last written 02/27/2019

## 2019-06-05 ENCOUNTER — Other Ambulatory Visit: Payer: Self-pay | Admitting: Family Medicine

## 2019-06-16 NOTE — Telephone Encounter (Signed)
Pt cannot qualify for the financial assistance, until he has an outstanding balance. So with that being said he'd like to proceed with the colonoscopy ( 06-29-2019 @1  pm ) as he's still having urgent, explosive stools.  He is requesting to add an EGD. Please advise. Thanks Vivien Rota

## 2019-06-16 NOTE — Addendum Note (Signed)
Addended by: Elias Else on: 06/16/2019 03:22 PM   Modules accepted: Orders

## 2019-06-28 ENCOUNTER — Telehealth: Payer: Self-pay | Admitting: Gastroenterology

## 2019-06-28 NOTE — Telephone Encounter (Signed)

## 2019-06-29 ENCOUNTER — Encounter: Payer: Self-pay | Admitting: Gastroenterology

## 2019-06-29 ENCOUNTER — Ambulatory Visit (AMBULATORY_SURGERY_CENTER): Payer: Self-pay | Admitting: Gastroenterology

## 2019-06-29 ENCOUNTER — Other Ambulatory Visit: Payer: Self-pay | Admitting: Family Medicine

## 2019-06-29 ENCOUNTER — Other Ambulatory Visit: Payer: Self-pay

## 2019-06-29 VITALS — BP 133/69 | HR 61 | Temp 99.3°F | Resp 17 | Ht 70.0 in | Wt 215.0 lb

## 2019-06-29 DIAGNOSIS — K5730 Diverticulosis of large intestine without perforation or abscess without bleeding: Secondary | ICD-10-CM

## 2019-06-29 DIAGNOSIS — D123 Benign neoplasm of transverse colon: Secondary | ICD-10-CM

## 2019-06-29 DIAGNOSIS — K529 Noninfective gastroenteritis and colitis, unspecified: Secondary | ICD-10-CM

## 2019-06-29 DIAGNOSIS — D124 Benign neoplasm of descending colon: Secondary | ICD-10-CM

## 2019-06-29 DIAGNOSIS — K921 Melena: Secondary | ICD-10-CM

## 2019-06-29 MED ORDER — SODIUM CHLORIDE 0.9 % IV SOLN: 500.0000 mL | Freq: Once | INTRAVENOUS | Status: DC

## 2019-06-29 NOTE — Progress Notes (Signed)
Called to room to assist during endoscopic procedure.  Patient ID and intended procedure confirmed with present staff. Received instructions for my participation in the procedure from the performing physician.  

## 2019-06-29 NOTE — Telephone Encounter (Signed)
Ok to refill??  Last office visit 05/02/2019.  Last refill 05/19/2019.

## 2019-06-29 NOTE — Op Note (Signed)
[name] Procedure Date: 06/29/2019 12:55 PM MRN: [id number] Endoscopist: Mallie Mussel L. Loletha Carrow , MD Age: 58 Referring MD:  Date of Birth: 07/27/1961 Gender: Male Account #: [id number] Procedure:                Colonoscopy Indications:              Chronic diarrhea (1 year), Rectal bleeding (few                            episodes) Medicines:                Monitored Anesthesia Care Procedure:                Pre-Anesthesia Assessment:                           - Prior to the procedure, a History and Physical                            was performed, and patient medications and                            allergies were reviewed. The patient's tolerance of                            previous anesthesia was also reviewed. The risks                            and benefits of the procedure and the sedation                            options and risks were discussed with the patient.                            All questions were answered, and informed consent                            was obtained. Prior Anticoagulants: The patient has                            taken no previous anticoagulant or antiplatelet                            agents. ASA Grade Assessment: III - A patient with                            severe systemic disease. After reviewing the risks                            and benefits, the patient was deemed in                            satisfactory condition to undergo the procedure.  After obtaining informed consent, the colonoscope                            was passed under direct vision. Throughout the                            procedure, the patient's blood pressure, pulse, and                            oxygen saturations were monitored continuously. The                            Colonoscope was introduced through the anus and                            advanced to the the terminal ileum, with                    identification of the appendiceal orifice and IC                            valve. The colonoscopy was performed without                            difficulty. The patient tolerated the procedure                            well. The quality of the bowel preparation was                            good. The terminal ileum, ileocecal valve,                            appendiceal orifice, and rectum were photographed. Scope In: 1:05:55 PM Scope Out: 1:21:58 PM Scope Withdrawal Time: 0 hours 13 minutes 26 seconds  Total Procedure Duration: 0 hours 16 minutes 3 seconds  Findings:                 The digital rectal exam findings include a single                            non-thrombosed external hemorrhoid (below dentate                            line).                           The terminal ileum appeared normal.                           Normal mucosa was found in the entire colon.                            Biopsies for histology were taken with a cold  forceps from the right colon and left colon for                            evaluation of microscopic colitis.                           Two sessile polyps were found in the transverse                            colon. The polyps were diminutive in size. These                            polyps were removed with a cold biopsy forceps.                            Resection and retrieval were complete.                           A 4 mm polyp was found in the descending colon. The                            polyp was sessile. The polyp was removed with a                            cold snare. Resection and retrieval were complete.                           Multiple diverticula were found in the left colon.                           The exam was otherwise without abnormality on                            direct and retroflexion views.                           If biopsies negative for microscopic colitis,  then                            overall clinical picture most consistent with IBS. Complications:            No immediate complications. Estimated Blood Loss:     Estimated blood loss was minimal. Impression:               - Non-thrombosed external hemorrhoids found on                            digital rectal exam.                           - The examined portion of the ileum was normal.                           - Normal mucosa in the entire examined colon.  Biopsied.                           - Two diminutive polyps in the transverse colon,                            removed with a cold biopsy forceps. Resected and                            retrieved.                           - One 4 mm polyp in the descending colon, removed                            with a cold snare. Resected and retrieved.                           - Diverticulosis in the left colon.                           - The examination was otherwise normal on direct                            and retroflexion views. Recommendation:           - Patient has a contact number available for                            emergencies. The signs and symptoms of potential                            delayed complications were discussed with the                            patient. Return to normal activities tomorrow.                            Written discharge instructions were provided to the                            patient.                           - Resume previous diet.                           - Continue present medications.                           - Await pathology results.                           - Repeat colonoscopy is recommended for                            surveillance. The colonoscopy date will be  determined after pathology results from today's                            exam become available for review.                           - Preparation H ointment:  Apply externally as                            necessary. If fails to control bleeding, refer for                            surgical evaluation. [name], MD 06/29/2019 1:32:15 PM This report has been signed electronically.

## 2019-06-29 NOTE — Progress Notes (Signed)
To PACU, VSS. Report to [email or web address] 

## 2019-06-29 NOTE — Patient Instructions (Signed)
YOU HAD AN ENDOSCOPIC PROCEDURE TODAY AT [facility]:   Refer to the procedure report that was given to you for any specific questions about what was found during the examination.  If the procedure report does not answer your questions, please call your gastroenterologist to clarify.  If you requested that your care partner not be given the details of your procedure findings, then the procedure report has been included in a sealed envelope for you to review at your convenience later.  YOU SHOULD EXPECT: Some feelings of bloating in the abdomen. Passage of more gas than usual.  Walking can help get rid of the air that was put into your GI tract during the procedure and reduce the bloating. If you had a lower endoscopy (such as a colonoscopy or flexible sigmoidoscopy) you may notice spotting of blood in your stool or on the toilet paper. If you underwent a bowel prep for your procedure, you may not have a normal bowel movement for a few days.  Please Note:  You might notice some irritation and congestion in your nose or some drainage.  This is from the oxygen used during your procedure.  There is no need for concern and it should clear up in a day or so.  SYMPTOMS TO REPORT IMMEDIATELY:   Following lower endoscopy (colonoscopy or flexible sigmoidoscopy):  Excessive amounts of blood in the stool  Significant tenderness or worsening of abdominal pains  Swelling of the abdomen that is new, acute  Fever of 100F or higher   For urgent or emergent issues, a gastroenterologist can be reached at any hour by calling [phone].   DIET:  We do recommend a small meal at first, but then you may proceed to your regular diet.  Drink plenty of fluids but you should avoid alcoholic beverages for 24 hours.  MEDICATIONS: Continue present medications. Use Preparation H ointment: Apply externally as necessary. If this fails to control bleeding, refer for surgical evaluation.  Please see  handouts given to you by your recovery nurse.  ACTIVITY:  You should plan to take it easy for the rest of today and you should NOT DRIVE or use heavy machinery until tomorrow (because of the sedation medicines used during the test).    FOLLOW UP: Our staff will call the number listed on your records 48-72 hours following your procedure to check on you and address any questions or concerns that you may have regarding the information given to you following your procedure. If we do not reach you, we will leave a message.  We will attempt to reach you two times.  During this call, we will ask if you have developed any symptoms of COVID 19. If you develop any symptoms (ie: fever, flu-like symptoms, shortness of breath, cough etc.) before then, please call [phone].  If you test positive for Covid 19 in the 2 weeks post procedure, please call and report this information to [place].    If any biopsies were taken you will be contacted by phone or by letter within the next 1-3 weeks.  Please call us at [phone] if you have not heard about the biopsies in 3 weeks.   Thank you for allowing [place] to provide for your healthcare needs today.   SIGNATURES/CONFIDENTIALITY: You and/or your care partner have signed paperwork which will be entered into your electronic medical record.  These signatures attest to the fact that that the information above on your After Visit Summary has been  reviewed and is understood.  Full responsibility of the confidentiality of this discharge information lies with you and/or your care-partner. 

## 2019-07-03 ENCOUNTER — Telehealth: Payer: Self-pay

## 2019-07-03 NOTE — Telephone Encounter (Signed)
  Follow up Call-  Call back number 06/29/2019  Post procedure Call Back phone  # [phone]  Permission to leave phone message Yes  Some recent data might be hidden     Patient questions:  Do you have a fever, pain , or abdominal swelling? No. Pain Score  0 *  Have you tolerated food without any problems? Yes.    Have you been able to return to your normal activities? Yes.    Do you have any questions about your discharge instructions: Diet   No. Medications  No. Follow up visit  No.  Do you have questions or concerns about your Care? No.  Actions: * If pain score is 4 or above: No action needed, pain <4.  1. Have you developed a fever since your procedure? no  2.   Have you had an respiratory symptoms (SOB or cough) since your procedure? no  3.   Have you tested positive for COVID 19 since your procedure no  4.   Have you had any family members/close contacts diagnosed with the COVID 19 since your procedure?  no   If yes to any of these questions please route to Joylene John, RN and Alphonsa Gin, Therapist, sports.

## 2019-07-03 NOTE — Telephone Encounter (Signed)
  Follow up Call-  Call back number 06/29/2019  Post procedure Call Back phone  # [phone]  Permission to leave phone message Yes  Some recent data might be hidden     Patient questions:  Do you have a fever, pain , or abdominal swelling? No. Pain Score  0 *  Have you tolerated food without any problems? Yes.    Have you been able to return to your normal activities? Yes.    Do you have any questions about your discharge instructions: Diet   No. Medications  No. Follow up visit  No.  Do you have questions or concerns about your Care? No.  Actions: * If pain score is 4 or above: No action needed, pain <4.  1. Have you developed a fever since your procedure? no  2.   Have you had an respiratory symptoms (SOB or cough) since your procedure? no  3.   Have you tested positive for COVID 19 since your procedure no  4.   Have you had any family members/close contacts diagnosed with the COVID 19 since your procedure?  no   If yes to any of these questions please route to Joylene John, RN and Alphonsa Gin, Therapist, sports.

## 2019-07-05 ENCOUNTER — Other Ambulatory Visit: Payer: Self-pay | Admitting: Family Medicine

## 2019-07-05 DIAGNOSIS — F411 Generalized anxiety disorder: Secondary | ICD-10-CM

## 2019-07-06 ENCOUNTER — Encounter: Payer: Self-pay | Admitting: Gastroenterology

## 2019-07-06 ENCOUNTER — Other Ambulatory Visit: Payer: Self-pay

## 2019-07-06 MED ORDER — DICYCLOMINE HCL 10 MG PO CAPS: 10.0000 mg | ORAL_CAPSULE | Freq: Three times a day (TID) | ORAL | 2 refills | Status: DC | PRN

## 2019-07-28 ENCOUNTER — Emergency Department (HOSPITAL_COMMUNITY): Payer: Medicare Other

## 2019-07-28 ENCOUNTER — Other Ambulatory Visit: Payer: Self-pay | Admitting: Family Medicine

## 2019-07-28 ENCOUNTER — Ambulatory Visit (INDEPENDENT_AMBULATORY_CARE_PROVIDER_SITE_OTHER): Payer: Medicare Other | Admitting: Family Medicine

## 2019-07-28 ENCOUNTER — Emergency Department (HOSPITAL_COMMUNITY)
Admission: EM | Admit: 2019-07-28 | Discharge: 2019-07-29 | Disposition: A | Discharge status: Home / Self Care | Payer: Medicare Other | Attending: Emergency Medicine | Admitting: Emergency Medicine

## 2019-07-28 ENCOUNTER — Other Ambulatory Visit: Payer: Self-pay

## 2019-07-28 ENCOUNTER — Encounter (HOSPITAL_COMMUNITY): Payer: Self-pay

## 2019-07-28 VITALS — BP 150/90 | HR 60 | Temp 98.8°F | Resp 14 | Ht 70.0 in | Wt 210.0 lb

## 2019-07-28 DIAGNOSIS — I10 Essential (primary) hypertension: Secondary | ICD-10-CM | POA: Diagnosis not present

## 2019-07-28 DIAGNOSIS — Z8673 Personal history of transient ischemic attack (TIA), and cerebral infarction without residual deficits: Secondary | ICD-10-CM | POA: Diagnosis not present

## 2019-07-28 DIAGNOSIS — Z7982 Long term (current) use of aspirin: Secondary | ICD-10-CM | POA: Diagnosis not present

## 2019-07-28 DIAGNOSIS — R079 Chest pain, unspecified: Secondary | ICD-10-CM | POA: Diagnosis present

## 2019-07-28 DIAGNOSIS — Z79899 Other long term (current) drug therapy: Secondary | ICD-10-CM | POA: Diagnosis not present

## 2019-07-28 LAB — CBC
HCT: 44.5 % (ref 39.0–52.0)
Hemoglobin: 14.8 g/dL (ref 13.0–17.0)
MCH: 31 pg (ref 26.0–34.0)
MCHC: 33.3 g/dL (ref 30.0–36.0)
MCV: 93.3 fL (ref 80.0–100.0)
Platelets: 249 10*3/uL (ref 150–400)
RBC: 4.77 MIL/uL (ref 4.22–5.81)
RDW: 12 % (ref 11.5–15.5)
WBC: 8 10*3/uL (ref 4.0–10.5)
nRBC: 0 % (ref 0.0–0.2)

## 2019-07-28 LAB — TROPONIN I (HIGH SENSITIVITY)
Troponin I (High Sensitivity): 3 ng/L (ref <18)
Troponin I (High Sensitivity): 4 ng/L (ref <18)

## 2019-07-28 LAB — BASIC METABOLIC PANEL
Anion gap: 8 (ref 5–15)
BUN: 7 mg/dL (ref 6–20)
CO2: 27 mmol/L (ref 22–32)
Calcium: 9 mg/dL (ref 8.9–10.3)
Chloride: 102 mmol/L (ref 98–111)
Creatinine, Ser: 0.95 mg/dL (ref 0.61–1.24)
GFR calc Af Amer: >60 mL/min (ref >60)
GFR calc non Af Amer: >60 mL/min (ref >60)
Glucose, Bld: 92 mg/dL (ref 70–99)
Potassium: 4.1 mmol/L (ref 3.5–5.1)
Sodium: 137 mmol/L (ref 135–145)

## 2019-07-28 MED ORDER — SODIUM CHLORIDE 0.9% FLUSH: 3.0000 mL | Freq: Once | INTRAVENOUS | Status: DC

## 2019-07-28 NOTE — ED Provider Notes (Signed)
[facility] EMERGENCY DEPARTMENT Provider Note   CSN: [id number] Arrival date & time: 07/28/19  1621    History   Chief Complaint Chief Complaint  Patient presents with  . Chest Pain    HPI Curtis Lucas is a 58 y.o. male.   The history is provided by the patient.  He has history of hypertension, hyperlipidemia and comes in because of chest pain.  He states that about noon today, he developed a sense like somebody was sitting on his chest.  That sensation only lasted for about 3 seconds before resolving.  He has had several other episodes during the day, none lasting more than 3-5 seconds.  However, in the past he has had similar episodes that have lasted as long as a minute.  There is no associated dyspnea, nausea, diaphoresis.  He has noted that he is feeling more fatigued than normal.  He is a non-smoker and denies history of diabetes.  He states he has been compliant with his medications.  Past Medical History:  Diagnosis Date  . Allergy    Rhinitis  . Anxiety   . Arthritis   . Biceps rupture, distal, right, initial encounter   . CVA (cerebral vascular accident) ([place])   . Depression   . Difficulty sleeping   . Elevated lipids   . GERD (gastroesophageal reflux disease)   . Hyperglycemia   . Hyperlipidemia   . Hypertension   . Insomnia     Patient Active Problem List   Diagnosis Date Noted  . Olecranon bursitis of left elbow 07/22/2017  . Impingement syndrome of right shoulder 07/08/2017  . Rupture of right distal biceps tendon 02/11/2017  . Generalized anxiety disorder 01/20/2017  . Prostate cancer screening 01/20/2017  . Vitamin D deficiency 05/06/2015  . Visual disturbance 05/02/2015  . Insomnia   . S/P right THA, AA 05/03/2012  . Hypertension   . GERD (gastroesophageal reflux disease)   . CVA (cerebral vascular accident) ([place])   . Allergy   . Elevated lipids   . Hyperglycemia     Past Surgical History:  Procedure Laterality Date   . COLONOSCOPY  09/24/2003   patterson--normal per pt.  [name] DISTAL BICEPS TENDON REPAIR Right 02/24/2017   Procedure: RIGHT DISTAL BICEPS TENDON REPAIR;  Surgeon: Leandrew Koyanagi, MD;  Location: [facility];  Service: Orthopedics;  Laterality: Right;  . HEAD INJURY   1987  . JOINT REPLACEMENT    . LUMBAR EPIDURAL INJECTION    . RECTAL SURGERY  2006  . TOTAL HIP ARTHROPLASTY    . TOTAL HIP ARTHROPLASTY  05/03/2012   Procedure: TOTAL HIP ARTHROPLASTY ANTERIOR APPROACH;  Surgeon: Mauri Pole, MD;  Location: [facility];  Service: Orthopedics;  Laterality: Right;        Home Medications    Prior to Admission medications   Medication Sig Start Date End Date Taking? Authorizing Provider  amLODipine (NORVASC) 10 MG tablet Take 1 tablet (10 mg total) by mouth daily. 06/05/19   Susy Frizzle, MD  aspirin 81 MG tablet Take 81 mg by mouth daily.    [provider]  atenolol (TENORMIN) 100 MG tablet TAKE ONE TABLET BY MOUTH DAILY 03/30/19   Susy Frizzle, MD  dicyclomine (BENTYL) 10 MG capsule Take 1 capsule (10 mg total) by mouth 3 (three) times daily as needed for spasms. 07/06/19   Doran Stabler, MD  escitalopram (LEXAPRO) 20 MG tablet TAKE ONE TABLET BY MOUTH DAILY  07/06/19   Susy Frizzle, MD  meloxicam (MOBIC) 7.5 MG tablet TAKE 1 TABLET BY MOUTH EVERY DAY WITH FOOD 03/29/17   Dena Billet B, PA-C  Omega-3 Fatty Acids (FISH OIL) 1200 MG CAPS Take 1 capsule by mouth daily.    [provider]  pantoprazole (PROTONIX) 40 MG tablet Take 1 tablet (40 mg total) by mouth daily. 12/19/18   Susy Frizzle, MD  rosuvastatin (CRESTOR) 20 MG tablet Take 1 tablet (20 mg total) by mouth daily. 05/04/19   Susy Frizzle, MD  traMADol (ULTRAM) 50 MG tablet TAKE ONE TABLET BY MOUTH EVERY 6 HOURS AS NEEDED. NEED OFFICE VISIT FOR FURTHER REFILLS 06/29/19   Susy Frizzle, MD  zolpidem (AMBIEN) 10 MG tablet TAKE 1 TABLET EVERY NIGHT AT BEDTIME AS NEEDED FOR SLEEP 02/28/19    Susy Frizzle, MD    Family History Family History  Problem Relation Age of Onset  . Stroke Mother   . Cancer Mother        colon  . Dementia Mother   . Colon cancer Mother   . Parkinson's disease Father   . Heart attack Paternal Grandfather   . Dementia Paternal Grandmother   . Colon polyps Neg Hx   . Esophageal cancer Neg Hx   . Rectal cancer Neg Hx   . Stomach cancer Neg Hx     Social History Social History   Tobacco Use  . Smoking status: Never Smoker  . Smokeless tobacco: Never Used  Substance Use Topics  . Alcohol use: Yes    Comment: 4-5 BEERS PER WK  . Drug use: No     Allergies   Lipitor [atorvastatin calcium]   Review of Systems Review of Systems  All other systems reviewed and are negative.    Physical Exam Updated Vital Signs BP (!) 188/101 (BP Location: Right Arm)   Pulse 62   Temp 99 F (37.2 C) (Oral)   Resp 16   SpO2 98%   Physical Exam Vitals signs and nursing note reviewed.    58 year old male, resting comfortably and in no acute distress. Vital signs are significant for elevated blood pressure. Oxygen saturation is 98%, which is normal. Head is normocephalic and atraumatic. PERRLA, EOMI. Oropharynx is clear. Neck is nontender and supple without adenopathy or JVD. Back is nontender and there is no CVA tenderness. Lungs are clear without rales, wheezes, or rhonchi. Chest is nontender. Heart has regular rate and rhythm without murmur. Abdomen is soft, flat, nontender without masses or hepatosplenomegaly and peristalsis is normoactive. Extremities have no cyanosis or edema, full range of motion is present. Skin is warm and dry without rash. Neurologic: Mental status is normal, cranial nerves are intact, there are no motor or sensory deficits.  ED Treatments / Results  Labs (all labs ordered are listed, but only abnormal results are displayed) Labs Reviewed  BASIC METABOLIC PANEL  CBC  TROPONIN I (HIGH SENSITIVITY)  TROPONIN  I (HIGH SENSITIVITY)    EKG EKG Interpretation  Date/Time:  Friday July 28 2019 16:26:50 EDT Ventricular Rate:  60 PR Interval:  156 QRS Duration: 84 QT Interval:  430 QTC Calculation: 430 R Axis:   49 Text Interpretation:  Normal sinus rhythm Normal ECG When compared with ECG of 02/22/2017, No significant change was found Confirmed by Delora Fuel ([id number]) on 07/28/2019 10:57:25 PM   Radiology Dg Chest 2 View  Result Date: 07/28/2019 CLINICAL DATA:  Chest pain EXAM: CHEST - 2 VIEW  COMPARISON:  03/05/2014 FINDINGS: The heart size and mediastinal contours are within normal limits. Both lungs are clear. Degenerative changes of the spine. Surgical clips in the left neck. IMPRESSION: No active cardiopulmonary disease. Electronically Signed   By: Donavan Foil M.D.   On: 07/28/2019 17:40    Procedures Procedures  Medications Ordered in ED Medications  sodium chloride flush (NS) 0.9 % injection 3 mL (has no administration in time range)     Initial Impression / Assessment and Plan / ED Course  I have reviewed the triage vital signs and the nursing notes.  Pertinent labs & imaging results that were available during my care of the patient were reviewed by me and considered in my medical decision making (see chart for details).  Chest pain with aspects that seem somewhat typical for coronary disease and aspects that seem quite atypical for coronary disease.  ECG is normal and initial troponin is normal.  Chest x-ray is normal.  Delta troponin is pending.  Other labs are normal.  Heart score is 3, which puts him at low risk for major adverse cardiac events in the next 6 weeks.  If delta troponin is not significantly elevated, will refer to cardiology for consideration for outpatient stress testing.  Repeat troponin is normal.  He is safe for discharge.  He is advised to take low-dose aspirin and is given prescription for nitroglycerin with instructions to return to the emergency department if  he ever has an episode of pain not relieved by 3 nitroglycerin.  Advised to monitor his blood pressure at home.  He is referred to cardiology for further evaluation.  Final Clinical Impressions(s) / ED Diagnoses   Final diagnoses:  Nonspecific chest pain  Elevated blood pressure reading with diagnosis of hypertension    ED Discharge Orders         Ordered    nitroGLYCERIN (NITROSTAT) 0.4 MG SL tablet  Every 5 min PRN     07/29/19 0003    Ambulatory referral to Cardiology     07/29/19 1610           Delora Fuel, MD 96/04/54 0009

## 2019-07-28 NOTE — ED Triage Notes (Signed)
Pt here for eval of chest pain to mid chest radiating to the right, and back that started today. Took gas x with no relief. Pt also reports some nausea. Took 324 Asprin.

## 2019-07-28 NOTE — Progress Notes (Signed)
Subjective:    Patient ID: Curtis Lucas, male    DOB: Apr 11, 1961, 58 y.o.   MRN: [id number]  HPI Patient presents today with chest pain.  He states that it has started around lunchtime.  He is suddenly getting intense pain in the center of his chest.  The pain is an 8 on a scale of 1-10.  It last 10 seconds to 1 minute at a time.  There is no burping.  There is no gas.  There is no indigestion.  The pain does not radiate but it is intense.  He describes it as a pressure-like sensation.  He denies any radiation into his neck or down his arm.  He denies any nausea or vomiting or diaphoresis.  He has no right upper quadrant pain or tenderness to palpation.  EKG today shows normal sinus rhythm with normal intervals and a normal axis.  There is mild elevation in the ST segment consistent with early repolarization.  This is a chronic finding present on previous EKG.  There is no evidence of STEMI. Past Medical History:  Diagnosis Date  . Allergy    Rhinitis  . Anxiety   . Arthritis   . Biceps rupture, distal, right, initial encounter   . CVA (cerebral vascular accident) ([place])   . Depression   . Difficulty sleeping   . Elevated lipids   . GERD (gastroesophageal reflux disease)   . Hyperglycemia   . Hyperlipidemia   . Hypertension   . Insomnia    Past Surgical History:  Procedure Laterality Date  . COLONOSCOPY  09/24/2003   patterson--normal per pt.  [name] DISTAL BICEPS TENDON REPAIR Right 02/24/2017   Procedure: RIGHT DISTAL BICEPS TENDON REPAIR;  Surgeon: Leandrew Koyanagi, MD;  Location: [facility];  Service: Orthopedics;  Laterality: Right;  . HEAD INJURY   1987  . JOINT REPLACEMENT    . LUMBAR EPIDURAL INJECTION    . RECTAL SURGERY  2006  . TOTAL HIP ARTHROPLASTY    . TOTAL HIP ARTHROPLASTY  05/03/2012   Procedure: TOTAL HIP ARTHROPLASTY ANTERIOR APPROACH;  Surgeon: Mauri Pole, MD;  Location: [facility];  Service: Orthopedics;  Laterality: Right;   Current Outpatient  Medications on File Prior to Visit  Medication Sig Dispense Refill  . amLODipine (NORVASC) 10 MG tablet Take 1 tablet (10 mg total) by mouth daily. 90 tablet 2  . aspirin 81 MG tablet Take 81 mg by mouth daily.    [name] atenolol (TENORMIN) 100 MG tablet TAKE ONE TABLET BY MOUTH DAILY 90 tablet 0  . dicyclomine (BENTYL) 10 MG capsule Take 1 capsule (10 mg total) by mouth 3 (three) times daily as needed for spasms. 60 capsule 2  . escitalopram (LEXAPRO) 20 MG tablet TAKE ONE TABLET BY MOUTH DAILY 90 tablet 3  . meloxicam (MOBIC) 7.5 MG tablet TAKE 1 TABLET BY MOUTH EVERY DAY WITH FOOD 90 tablet 1  . Omega-3 Fatty Acids (FISH OIL) 1200 MG CAPS Take 1 capsule by mouth daily.    . pantoprazole (PROTONIX) 40 MG tablet Take 1 tablet (40 mg total) by mouth daily. 90 tablet 1  . rosuvastatin (CRESTOR) 20 MG tablet Take 1 tablet (20 mg total) by mouth daily. 90 tablet 1  . traMADol (ULTRAM) 50 MG tablet TAKE ONE TABLET BY MOUTH EVERY 6 HOURS AS NEEDED. NEED OFFICE VISIT FOR FURTHER REFILLS 30 tablet 0  . zolpidem (AMBIEN) 10 MG tablet TAKE 1 TABLET EVERY NIGHT AT BEDTIME AS NEEDED  FOR SLEEP 30 tablet 5   Current Facility-Administered Medications on File Prior to Visit  Medication Dose Route Frequency Provider Last Rate Last Dose  . 0.9 %  sodium chloride infusion  500 mL Intravenous Once Doran Stabler, MD       Allergies  Allergen Reactions  . Lipitor [Atorvastatin Calcium]     Severe muscle spasms   Social History   Socioeconomic History  . Marital status: Married    Spouse name: Jackelyn Poling  . Number of children: 0  . Years of education: HS  . Highest education level: Not on file  Occupational History    Employer: [employer]    Comment: Product manager  Social Needs  . Financial resource strain: Not on file  . Food insecurity    Worry: Not on file    Inability: Not on file  . Transportation needs    Medical: Not on file    Non-medical: Not on file  Tobacco Use  . Smoking  status: Never Smoker  . Smokeless tobacco: Never Used  Substance and Sexual Activity  . Alcohol use: Yes    Comment: 4-5 BEERS PER WK  . Drug use: No  . Sexual activity: Yes  Lifestyle  . Physical activity    Days per week: Not on file    Minutes per session: Not on file  . Stress: Not on file  Relationships  . Social Herbalist on phone: Not on file    Gets together: Not on file    Attends religious service: Not on file    Active member of club or organization: Not on file    Attends meetings of clubs or organizations: Not on file    Relationship status: Not on file  . Intimate partner violence    Fear of current or ex partner: Not on file    Emotionally abused: Not on file    Physically abused: Not on file    Forced sexual activity: Not on file  Other Topics Concern  . Not on file  Social History Narrative   Patient lives at home with spouse.   Caffeine Use:4 16oz Mt. Dew sodas daily      Review of Systems  All other systems reviewed and are negative.      Objective:   Physical Exam Vitals signs reviewed.  Constitutional:      General: He is not in acute distress.    Appearance: Normal appearance. He is normal weight. He is not ill-appearing or toxic-appearing.  HENT:     Head: Normocephalic and atraumatic.  Cardiovascular:     Rate and Rhythm: Normal rate and regular rhythm.     Pulses: Normal pulses.     Heart sounds: Normal heart sounds. No murmur. No friction rub. No gallop.   Pulmonary:     Effort: Pulmonary effort is normal. No respiratory distress.     Breath sounds: Normal breath sounds. No stridor. No wheezing, rhonchi or rales.  Chest:     Chest wall: No tenderness.  Abdominal:     General: Abdomen is flat. Bowel sounds are normal.     Palpations: Abdomen is soft.     Tenderness: There is no abdominal tenderness. There is no guarding.  Musculoskeletal:     Right lower leg: No edema.     Left lower leg: No edema.  Neurological:      Mental Status: He is alert.  Assessment & Plan:  The encounter diagnosis was Chest pain, unspecified type. EKG shows no acute changes.  However, patient continues to have episodes of intense chest pain.  In fact he had one episode that lasted 10 seconds while he was getting the EKG.  I believe the patient requires an evaluation in the emergency room to rule out ACS/unstable angina.  Patient was given 4 aspirin, 81 mg, that he chewed in the office.  He declines EMS transport and insist to allow his wife to take him to the hospital.  His wife will take him directly to [facility] emergency room.  We will notify them of his arrival.

## 2019-07-28 NOTE — ED Notes (Signed)
Patient denies pain at this time and is resting comfortably.

## 2019-07-29 MED ORDER — NITROGLYCERIN 0.4 MG SL SUBL: 0.4000 mg | SUBLINGUAL_TABLET | SUBLINGUAL | 0 refills | Status: DC | PRN

## 2019-07-29 NOTE — Discharge Instructions (Addendum)
Take aspirin 81 mg every day until you see the cardiologist.  If you have this heavy feeling again, take the nitroglycerin tablet (let it dissolve under your tongue). You may repeat the nitroglycerine every five minutes as needed - up to three doses. If the heavy feeling is still there after three nitroglycerin tablets, then come to the Emergency Department immediately!  Please monitor your blood pressure at home.

## 2019-07-29 NOTE — ED Notes (Signed)
Patient verbalizes understanding of discharge instructions. Opportunity for questioning and answers were provided. Armband removed by staff, pt discharged from ED.  

## 2019-07-31 NOTE — Telephone Encounter (Signed)
Requesting refill    Tramadol  LOV: 07/28/19  LRF:  06/29/19

## 2019-08-02 ENCOUNTER — Telehealth: Payer: Self-pay

## 2019-08-02 NOTE — Telephone Encounter (Signed)
NOTES ON FILE FROM THE ED, SENT REFERRAL TO SCHEDULING 

## 2019-08-10 ENCOUNTER — Other Ambulatory Visit: Payer: Self-pay | Admitting: Family Medicine

## 2019-08-30 ENCOUNTER — Other Ambulatory Visit: Payer: Self-pay | Admitting: Family Medicine

## 2019-08-30 NOTE — Telephone Encounter (Signed)
Requesting refill    Tramadol & Ambien  LOV: 07/28/19  LRF:  07/31/19 & 07/28/19

## 2019-08-31 ENCOUNTER — Other Ambulatory Visit: Payer: Self-pay | Admitting: Family Medicine

## 2019-08-31 ENCOUNTER — Encounter: Payer: Self-pay | Admitting: Cardiology

## 2019-08-31 ENCOUNTER — Ambulatory Visit (INDEPENDENT_AMBULATORY_CARE_PROVIDER_SITE_OTHER): Payer: Medicare HMO | Admitting: Cardiology

## 2019-08-31 ENCOUNTER — Other Ambulatory Visit: Payer: Self-pay

## 2019-08-31 VITALS — BP 147/91 | HR 58 | Temp 97.2°F | Ht 70.0 in | Wt 214.2 lb

## 2019-08-31 DIAGNOSIS — E782 Mixed hyperlipidemia: Secondary | ICD-10-CM

## 2019-08-31 DIAGNOSIS — R072 Precordial pain: Secondary | ICD-10-CM

## 2019-08-31 DIAGNOSIS — Z01812 Encounter for preprocedural laboratory examination: Secondary | ICD-10-CM | POA: Diagnosis not present

## 2019-08-31 DIAGNOSIS — I10 Essential (primary) hypertension: Secondary | ICD-10-CM

## 2019-08-31 NOTE — Patient Instructions (Addendum)
Medication Instructions:  Your physician recommends that you continue on your current medications as directed. Please refer to the Current Medication list given to you today.  If you need a refill on your cardiac medications before your next appointment, please call your pharmacy.   Lab work: BMET 1 week prior to procedure  If you have labs (blood work) drawn today and your tests are completely normal, you will receive your results only by: [place] (if you have MyChart) OR A paper copy in the mail If you have any lab test that is abnormal or we need to change your treatment, we will call you to review the results.  Testing/Procedures: Your physician has requested that you have cardiac CT. Cardiac computed tomography (CT) is a painless test that uses an x-ray machine to take clear, detailed pictures of your heart. For further information please visit [email or web address]. Please follow instruction sheet as given.   Follow-Up: As needed.   Your cardiac CT will be scheduled at one of the below locations:   [facility] [address] [place], [place] [address] [phone]   If scheduled at [facility], please arrive at the [facility] main entrance of [facility] 30-45 minutes prior to test start time. Proceed to the [facility] Radiology Department (first floor) to check-in and test prep.  If scheduled at [facility], please arrive 15 mins early for check-in and test prep.  Please follow these instructions carefully (unless otherwise directed):  Hold all erectile dysfunction medications at least 5 days prior to test.  On the Night Before the Test: . Be sure to Drink plenty of water. . Do not consume any caffeinated/decaffeinated beverages or chocolate 12 hours prior to your test. . Do not take any antihistamines 12 hours prior to your test.  On the Day of the Test: . Drink plenty of water. Do not drink any water  within one hour of the test. . Do not eat any food 4 hours prior to the test. . You may take your regular medications prior to the test.       After the Test: . Drink plenty of water. . After receiving IV contrast, you may experience a mild flushed feeling. This is normal. . On occasion, you may experience a mild rash up to 24 hours after the test. This is not dangerous. If this occurs, you can take Benadryl 25 mg and increase your fluid intake. . If you experience trouble breathing, this can be serious. If it is severe call 911 IMMEDIATELY. If it is mild, please call our office    Please contact the cardiac imaging nurse navigator should you have any questions/concerns Marchia Bond, RN Navigator Cardiac Imaging [facility] [phone] Office  [phone] Cell

## 2019-08-31 NOTE — Progress Notes (Signed)
Cardiology Office Note:    Date:  08/31/2019   ID:  Raman, Blyden Sep 10, 1961, MRN [id number]  PCP:  Susy Frizzle, MD  Cardiologist:  Buford Dresser, MD PhD  Referring MD: Delora Fuel, MD   CC: new patient evaluation for chest pain  History of Present Illness:    Curtis Lucas is a 58 y.o. 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  He was seen in the ER on 07/28/19 for chest pain. Notes and workup reviewed. ECG nonischemic, Tn 3->4. Recommended for outpatient cardiology evaluation.  Chest pain: -Initial onset: 1 year ago (lasted about 5 min), then again on 07/28/19, none since -Quality: sharp, intense midepigastric pain, moved to right side. Not up to arm/neck -Frequency/duration: bad pain, last 5 seconds, then would come back, did this for hours -Associated symptoms: no lightheadedness/nausea/diaphoresis -Aggravating/alleviating factors: no clear triggers. Started when he was walking, didn't get better with rest -Prior cardiac history: none -Prior ECG: normal -Prior workup: none -Prior treatment: none -Alcohol: drinks 6 pk/night -Tobacco: never -Comorbidities: HTN, HLD -Exercise level: no issues with routine activity. No intentional exercises, but able to chase dog in the yard, etc. -Cardiac ROS: no shortness of breath, no PND, no orthopnea, no LE edema, no syncope -Family history: pat gpa has massive MI at 60 years old and passed away.   Past Medical History:  Diagnosis Date  . Allergy    Rhinitis  . Anxiety   . Arthritis   . Biceps rupture, distal, right, initial encounter   . CVA (cerebral vascular accident) ([place])   . Depression   . Difficulty sleeping   . Elevated lipids   . GERD (gastroesophageal reflux disease)   . Hyperglycemia   . Hyperlipidemia   . Hypertension   . Insomnia     Past Surgical History:   Procedure Laterality Date  . COLONOSCOPY  09/24/2003   patterson--normal per pt.  [name] DISTAL BICEPS TENDON REPAIR Right 02/24/2017   Procedure: RIGHT DISTAL BICEPS TENDON REPAIR;  Surgeon: Leandrew Koyanagi, MD;  Location: [place];  Service: Orthopedics;  Laterality: Right;  . HEAD INJURY   1987  . JOINT REPLACEMENT    . LUMBAR EPIDURAL INJECTION    . RECTAL SURGERY  2006  . TOTAL HIP ARTHROPLASTY    . TOTAL HIP ARTHROPLASTY  05/03/2012   Procedure: TOTAL HIP ARTHROPLASTY ANTERIOR APPROACH;  Surgeon: Mauri Pole, MD;  Location: [facility];  Service: Orthopedics;  Laterality: Right;    Current Medications: Current Outpatient Medications on File Prior to Visit  Medication Sig  . amLODipine (NORVASC) 10 MG tablet Take 1 tablet (10 mg total) by mouth daily.  [name] aspirin 81 MG tablet Take 81 mg by mouth daily.  [name] atenolol (TENORMIN) 100 MG tablet TAKE ONE TABLET BY MOUTH DAILY (Patient taking differently: Take 100 mg by mouth daily. )  . dicyclomine (BENTYL) 10 MG capsule Take 1 capsule (10 mg total) by mouth 3 (three) times daily as needed for spasms.  [name] escitalopram (LEXAPRO) 20 MG tablet TAKE ONE TABLET BY MOUTH DAILY (Patient taking differently: Take 20 mg by mouth daily. )  . nitroGLYCERIN (NITROSTAT) 0.4 MG SL tablet Place 1 tablet (0.4 mg total) under the tongue every 5 (five) minutes as needed for chest pain.  . Omega-3 Fatty Acids (FISH OIL) 1200  MG CAPS Take 1 capsule by mouth at bedtime.   . pantoprazole (PROTONIX) 40 MG tablet TAKE ONE TABLET BY MOUTH DAILY  . rosuvastatin (CRESTOR) 20 MG tablet Take 1 tablet (20 mg total) by mouth daily.  . traMADol (ULTRAM) 50 MG tablet TAKE 1 TABLET BY MOUTH EVERY 6 HOURS AS NEEDED *NEED OFFICE VISIT FOR FUTURE REFILLS, PER MD*  . zolpidem (AMBIEN) 10 MG tablet TAKE ONE TABLET BY MOUTH EVERY NIGHT AT BEDTIME AS NEEDED FOR SLEEP   Current Facility-Administered Medications on File Prior to Visit  Medication  . 0.9 %  sodium chloride  infusion     Allergies:   Lipitor [atorvastatin calcium]   Social History   Socioeconomic History  . Marital status: Married    Spouse name: Jackelyn Poling  . Number of children: 0  . Years of education: HS  . Highest education level: Not on file  Occupational History    Employer: [employer]    Comment: Product manager  Social Needs  . Financial resource strain: Not on file  . Food insecurity    Worry: Not on file    Inability: Not on file  . Transportation needs    Medical: Not on file    Non-medical: Not on file  Tobacco Use  . Smoking status: Never Smoker  . Smokeless tobacco: Never Used  Substance and Sexual Activity  . Alcohol use: Yes    Comment: 4-5 BEERS PER WK  . Drug use: Yes    Types: Marijuana  . Sexual activity: Yes  Lifestyle  . Physical activity    Days per week: Not on file    Minutes per session: Not on file  . Stress: Not on file  Relationships  . Social Herbalist on phone: Not on file    Gets together: Not on file    Attends religious service: Not on file    Active member of club or organization: Not on file    Attends meetings of clubs or organizations: Not on file    Relationship status: Not on file  Other Topics Concern  . Not on file  Social History Narrative   Patient lives at home with spouse.   Caffeine Use:4 16oz Mt. Dew sodas daily     Family History: The patient's family history includes Cancer in his mother; Colon cancer in his mother; Dementia in his mother and paternal grandmother; Heart attack in his paternal grandfather; Parkinson's disease in his father; Stroke in his mother. There is no history of Colon polyps, Esophageal cancer, Rectal cancer, or Stomach cancer.  ROS:   Please see the history of present illness.  Additional pertinent ROS: Constitutional: Negative for chills, fever, night sweats, unintentional weight loss  HENT: Negative for ear pain and hearing loss.   Eyes: Negative for loss of vision and  eye pain.  Respiratory: Negative for cough, sputum, wheezing.   Cardiovascular: See HPI. Gastrointestinal: Negative for abdominal pain, melena, and hematochezia.  Genitourinary: Negative for dysuria and hematuria.  Musculoskeletal: Negative for falls and myalgias.  Skin: Negative for itching and rash.  Neurological: Negative for focal weakness, focal sensory changes and loss of consciousness.  Endo/Heme/Allergies: Does not bruise/bleed easily.     EKGs/Labs/Other Studies Reviewed:    The following studies were reviewed today: No prior cardiac studies  EKG:  EKG is personally reviewed.  The ekg ordered today demonstrates sinus bradycardia at 58 bpm  Recent Labs: 05/02/2019: ALT 33 07/28/2019: BUN 7; Creatinine, Ser  0.95; Hemoglobin 14.8; Platelets 249; Potassium 4.1; Sodium 137  Recent Lipid Panel    Component Value Date/Time   CHOL 225 (H) 05/02/2019 0950   TRIG 171 (H) 05/02/2019 0950   HDL 45 05/02/2019 0950   CHOLHDL 5.0 (H) 05/02/2019 0950   VLDL 10 08/11/2017 1135   LDLCALC 149 (H) 05/02/2019 0950    Physical Exam:    VS:  BP (!) 147/91   Pulse (!) 58   Temp (!) 97.2 F (36.2 C)   Ht 5\' 10"  (1.778 m)   Wt 214 lb 3.2 oz (97.2 kg)   SpO2 91%   BMI 30.73 kg/m     Wt Readings from Last 3 Encounters:  08/31/19 214 lb 3.2 oz (97.2 kg)  07/28/19 210 lb (95.3 kg)  06/29/19 215 lb (97.5 kg)    GEN: Well nourished, well developed in no acute distress HEENT: Normal, moist mucous membranes NECK: No JVD CARDIAC: regular rhythm, normal S1 and S2, no murmurs, rubs, gallops.  VASCULAR: Radial and DP pulses 2+ bilaterally. No carotid bruits RESPIRATORY:  Clear to auscultation without rales, wheezing or rhonchi  ABDOMEN: Soft, non-tender, non-distended MUSCULOSKELETAL:  Ambulates independently SKIN: Warm and dry, no edema NEUROLOGIC:  Alert and oriented x 3. No focal neuro deficits noted. PSYCHIATRIC:  Normal affect    ASSESSMENT:    1. Pre-procedure lab exam   2.  Precordial pain   3. Essential hypertension   4. Mixed hyperlipidemia    PLAN:    Chest pain: concerning symptoms, with multiple elevated CV risk factors. -discussed treadmill stress, nuclear stress/lexiscan, and CT coronary angiography. Discussed pros and cons of each, including but not limited to false positive/false negative risk, radiation risk, and risk of IV contrast dye. Based on shared decision making, decision was made to pursue CT coronary angiography. -continue home beta blocker, HR well controlled -counseled on need to get BMET one week prior to test -counseled on use of sublingual nitroglycerin and its importance to a good test -counseled on red flag warning symptoms that need immediate medical evaluation  Cardiac risk counseling and prevention recommendations: -recommend heart healthy/Mediterranean diet, with whole grains, fruits, vegetable, fish, lean meats, nuts, and olive oil. Limit salt. -recommend moderate walking, 3-5 times/week for 30-50 minutes each session. Aim for at least 150 minutes.week. Goal should be pace of 3 miles/hours, or walking 1.5 miles in 30 minutes -recommend avoidance of tobacco products. Avoid excess alcohol. -Additional risk factor control:  -Diabetes: A1c is 5.5 in 2017  -Lipids: mixed hyperlipidemia. On pravastatin, lipids 04/2019 were Tchol 220, HDL 45, TG 171, LDL 149. Changed to rosuvastatin at that time  -hypertension: elevated today, endorses good control at home. Continue amlodipine, atenolol for now  -Weight: BMI 30, would benefit from weight loss -prior history of injury induced CVA (from carotid artery injury). Continue statin and aspirin  Plan for follow up: TBD based on results of testing  Medication Adjustments/Labs and Tests Ordered: Current medicines are reviewed at length with the patient today.  Concerns regarding medicines are outlined above.  Orders Placed This Encounter  Procedures  . CT CORONARY MORPH W/CTA COR W/SCORE W/CA  W/CM &/OR WO/CM  . CT CORONARY FRACTIONAL FLOW RESERVE DATA PREP  . CT CORONARY FRACTIONAL FLOW RESERVE FLUID ANALYSIS  . Basic metabolic panel  . EKG 12-Lead   No orders of the defined types were placed in this encounter.   Patient Instructions  Medication Instructions:  Your physician recommends that you continue on your current medications  as directed. Please refer to the Current Medication list given to you today.  If you need a refill on your cardiac medications before your next appointment, please call your pharmacy.   Lab work: BMET 1 week prior to procedure  If you have labs (blood work) drawn today and your tests are completely normal, you will receive your results only by: [place] (if you have MyChart) OR A paper copy in the mail If you have any lab test that is abnormal or we need to change your treatment, we will call you to review the results.  Testing/Procedures: Your physician has requested that you have cardiac CT. Cardiac computed tomography (CT) is a painless test that uses an x-ray machine to take clear, detailed pictures of your heart. For further information please visit [email or web address][email or web address]. Please follow instruction sheet as given.   Follow-Up: As needed.   Your cardiac CT will be scheduled at one of the below locations:   [facility] [address] [place], [place] [address] [phone]   If scheduled at [facility], please arrive at the [facility] main entrance of [facility] 30-45 minutes prior to test start time. Proceed to the [facility] Radiology Department (first floor) to check-in and test prep.  If scheduled at [facility], please arrive 15 mins early for check-in and test prep.  Please follow these instructions carefully (unless otherwise directed):  Hold all erectile dysfunction medications at least 5 days prior to test.  On the Night Before the Test: . Be sure to Drink  plenty of water. . Do not consume any caffeinated/decaffeinated beverages or chocolate 12 hours prior to your test. . Do not take any antihistamines 12 hours prior to your test.  On the Day of the Test: . Drink plenty of water. Do not drink any water within one hour of the test. . Do not eat any food 4 hours prior to the test. . You may take your regular medications prior to the test.       After the Test: . Drink plenty of water. . After receiving IV contrast, you may experience a mild flushed feeling. This is normal. . On occasion, you may experience a mild rash up to 24 hours after the test. This is not dangerous. If this occurs, you can take Benadryl 25 mg and increase your fluid intake. . If you experience trouble breathing, this can be serious. If it is severe call 911 IMMEDIATELY. If it is mild, please call our office    Please contact the cardiac imaging nurse navigator should you have any questions/concerns Marchia Bond, RN Navigator Cardiac Imaging [facility] [phone] Office  [phone] Cell         Signed, Buford Dresser, MD PhD 08/31/2019 7:37 PM    [place]

## 2019-09-27 DIAGNOSIS — Z01812 Encounter for preprocedural laboratory examination: Secondary | ICD-10-CM | POA: Diagnosis not present

## 2019-09-28 LAB — BASIC METABOLIC PANEL
BUN/Creatinine Ratio: 16 (ref 9–20)
BUN: 14 mg/dL (ref 6–24)
CO2: 23 mmol/L (ref 20–29)
Calcium: 9.2 mg/dL (ref 8.7–10.2)
Chloride: 102 mmol/L (ref 96–106)
Creatinine, Ser: 0.9 mg/dL (ref 0.76–1.27)
GFR calc Af Amer: 108 mL/min/{1.73_m2} (ref >59)
GFR calc non Af Amer: 93 mL/min/{1.73_m2} (ref >59)
Glucose: 107 mg/dL — ABNORMAL HIGH (ref 65–99)
Potassium: 4.7 mmol/L (ref 3.5–5.2)
Sodium: 138 mmol/L (ref 134–144)

## 2019-10-01 ENCOUNTER — Other Ambulatory Visit: Payer: Self-pay | Admitting: Family Medicine

## 2019-10-02 ENCOUNTER — Telehealth (HOSPITAL_COMMUNITY): Payer: Self-pay | Admitting: Emergency Medicine

## 2019-10-02 NOTE — Telephone Encounter (Signed)
Reaching out to patient to offer assistance regarding upcoming cardiac imaging study; pt verbalizes understanding of appt date/time, parking situation and where to check in, pre-test NPO status and medications ordered, and verified current allergies; name and call back number provided for further questions should they arise Rease Swinson RN Navigator Cardiac Imaging [facility] [phone] office [phone] cell 

## 2019-10-03 ENCOUNTER — Encounter: Payer: Medicare HMO | Admitting: *Deleted

## 2019-10-03 ENCOUNTER — Other Ambulatory Visit: Payer: Self-pay

## 2019-10-03 ENCOUNTER — Ambulatory Visit (HOSPITAL_COMMUNITY)
Admission: RE | Admit: 2019-10-03 | Discharge: 2019-10-03 | Disposition: A | Discharge status: Home / Self Care | Payer: Medicare HMO | Source: Ambulatory Visit | Attending: Cardiology | Admitting: Cardiology

## 2019-10-03 ENCOUNTER — Other Ambulatory Visit: Payer: Self-pay | Admitting: Family Medicine

## 2019-10-03 DIAGNOSIS — R072 Precordial pain: Secondary | ICD-10-CM | POA: Diagnosis not present

## 2019-10-03 DIAGNOSIS — Z006 Encounter for examination for normal comparison and control in clinical research program: Secondary | ICD-10-CM

## 2019-10-03 MED ORDER — NITROGLYCERIN 0.4 MG SL SUBL
SUBLINGUAL_TABLET | SUBLINGUAL | Status: AC
  Filled 2019-10-03: qty 2

## 2019-10-03 MED ORDER — NITROGLYCERIN 0.4 MG SL SUBL
0.8000 mg | SUBLINGUAL_TABLET | Freq: Once | SUBLINGUAL | Status: AC
  Administered 2019-10-03: 13:00:00 0.8 mg via SUBLINGUAL
  Filled 2019-10-03: qty 25

## 2019-10-03 MED ORDER — IOHEXOL 350 MG/ML SOLN
100.0000 mL | Freq: Once | INTRAVENOUS | Status: AC | PRN
  Administered 2019-10-03: 100 mL via INTRAVENOUS

## 2019-10-03 NOTE — Research (Signed)
CADFEM Informed Consent                  Subject Name:   Curtis Lucas   Subject met inclusion and exclusion criteria.  The informed consent form, study requirements and expectations were reviewed with the subject and questions and concerns were addressed prior to the signing of the consent form.  The subject verbalized understanding of the trial requirements.  The subject agreed to participate in the CADFEM trial and signed the informed consent.  The informed consent was obtained prior to performance of any protocol-specific procedures for the subject.  A copy of the signed informed consent was given to the subject and a copy was placed in the subject's medical record.   [place] Chelesa Weingartner, Research Assistant 10/03/2019 11:38 a.m.

## 2019-10-04 NOTE — Telephone Encounter (Signed)
Ok to refill??  Last office visit 07/28/2019.  Last refill 08/31/2019.

## 2019-10-04 NOTE — Telephone Encounter (Signed)
Requested Prescriptions   Pending Prescriptions Disp Refills  . traMADol (ULTRAM) 50 MG tablet [Pharmacy Med Name: traMADol HCL 50MG  TABLET] 30 tablet 0    Sig: TAKE 1 TABLET BY MOUTH EVERY 6 HOURS AS NEEDED (NEED OFFICE VISIT FOR FUTURE REFILLS, PER MD)    Last OV 07/28/2019  Last written 08/31/2019

## 2019-10-19 ENCOUNTER — Other Ambulatory Visit: Payer: Self-pay | Admitting: Gastroenterology

## 2019-10-31 ENCOUNTER — Other Ambulatory Visit: Payer: Self-pay | Admitting: Family Medicine

## 2019-11-05 ENCOUNTER — Other Ambulatory Visit: Payer: Self-pay | Admitting: Family Medicine

## 2019-11-06 NOTE — Telephone Encounter (Signed)
Ok to refill??  Last office visit 07/28/2019.  Last refill 10/05/2019.

## 2019-11-28 ENCOUNTER — Encounter: Payer: Self-pay | Admitting: Family Medicine

## 2019-11-28 ENCOUNTER — Other Ambulatory Visit: Payer: Self-pay

## 2019-11-28 ENCOUNTER — Ambulatory Visit (INDEPENDENT_AMBULATORY_CARE_PROVIDER_SITE_OTHER): Payer: Medicare HMO | Admitting: Family Medicine

## 2019-11-28 VITALS — BP 138/86 | HR 82 | Temp 98.2°F | Resp 14 | Ht 70.0 in | Wt 217.0 lb

## 2019-11-28 DIAGNOSIS — F5101 Primary insomnia: Secondary | ICD-10-CM

## 2019-11-28 DIAGNOSIS — I10 Essential (primary) hypertension: Secondary | ICD-10-CM | POA: Diagnosis not present

## 2019-11-28 DIAGNOSIS — S46211S Strain of muscle, fascia and tendon of other parts of biceps, right arm, sequela: Secondary | ICD-10-CM | POA: Diagnosis not present

## 2019-11-28 DIAGNOSIS — R7309 Other abnormal glucose: Secondary | ICD-10-CM | POA: Diagnosis not present

## 2019-11-28 DIAGNOSIS — E785 Hyperlipidemia, unspecified: Secondary | ICD-10-CM | POA: Diagnosis not present

## 2019-11-28 NOTE — Progress Notes (Signed)
Subjective:    Patient ID: Curtis Lucas, male    DOB: [birth year], 58 y.o.   MRN: [id number]  HPI Patient is here today for a follow-up of his chronic medical problems.  Patient has remote history of a stroke.  This was due to a traumatic accident.  Patient had a metal rod pierced his left orbit, and impinge upon the left internal carotid artery.  This required emergency surgery.  This was performed at [facility].  He also has a history of a ruptured right biceps requiring surgical correction.  However the patient suffers chronic pain in his right antecubital fossa ever since.  He has to take tramadol 1-2 times a day due to the persistent pain in his right elbow and right bicep.  He has diminished strength in his arm and is unable to use it due to easy fatigability.  He also uses Ambien at night to help him sleep.  He has a history of hypertension however his blood pressure today is well controlled at 138/86.  Patient was referred to cardiology who performed a coronary artery CT scan which showed no blockages or evidence of coronary artery disease.  He is also on Crestor for hyperlipidemia.  He denies any myalgias right upper quadrant pain.  He is due today for lab work. Past Medical History:  Diagnosis Date  . Allergy    Rhinitis  . Anxiety   . Arthritis   . Biceps rupture, distal, right, initial encounter   . CVA (cerebral vascular accident) ([place])   . Depression   . Difficulty sleeping   . Elevated lipids   . GERD (gastroesophageal reflux disease)   . Hyperglycemia   . Hyperlipidemia   . Hypertension   . Insomnia    Past Surgical History:  Procedure Laterality Date  . COLONOSCOPY  09/24/2003   patterson--normal per pt.  [name] DISTAL BICEPS TENDON REPAIR Right 02/24/2017   Procedure: RIGHT DISTAL BICEPS TENDON REPAIR;  Surgeon: Leandrew Koyanagi, MD;  Location: [place];  Service: Orthopedics;  Laterality: Right;  . HEAD INJURY   1987  . JOINT REPLACEMENT    . LUMBAR  EPIDURAL INJECTION    . RECTAL SURGERY  2006  . TOTAL HIP ARTHROPLASTY    . TOTAL HIP ARTHROPLASTY  05/03/2012   Procedure: TOTAL HIP ARTHROPLASTY ANTERIOR APPROACH;  Surgeon: Mauri Pole, MD;  Location[facility];  Service: Orthopedics;  Laterality: Right;   Current Outpatient Medications on File Prior to Visit  Medication Sig Dispense Refill  . amLODipine (NORVASC) 10 MG tablet Take 1 tablet (10 mg total) by mouth daily. 90 tablet 2  . aspirin 81 MG tablet Take 81 mg by mouth daily.    [name] atenolol (TENORMIN) 100 MG tablet TAKE ONE TABLET BY MOUTH DAILY 90 tablet 0  . dicyclomine (BENTYL) 10 MG capsule TAKE ONE CAPSULE BY MOUTH THREE TIMES A DAY AS NEEDED FOR SPASMS 60 capsule 1  . escitalopram (LEXAPRO) 20 MG tablet TAKE ONE TABLET BY MOUTH DAILY (Patient taking differently: Take 20 mg by mouth daily. ) 90 tablet 3  . nitroGLYCERIN (NITROSTAT) 0.4 MG SL tablet Place 1 tablet (0.4 mg total) under the tongue every 5 (five) minutes as needed for chest pain. 30 tablet 0  . Omega-3 Fatty Acids (FISH OIL) 1200 MG CAPS Take 1 capsule by mouth at bedtime.     . pantoprazole (PROTONIX) 40 MG tablet TAKE ONE TABLET BY MOUTH DAILY 90 tablet 3  . rosuvastatin (  CRESTOR) 20 MG tablet TAKE ONE TABLET BY MOUTH DAILY. DISCONTINUE PRAVASTATIN 90 tablet 1  . traMADol (ULTRAM) 50 MG tablet TAKE ONE TABLET BY MOUTH EVERY 6 HOURS AS NEEDED *NEED OFFICE VISIT FOR FURTHER REFILLS* 30 tablet 0  . zolpidem (AMBIEN) 10 MG tablet TAKE ONE TABLET BY MOUTH EVERY NIGHT AT BEDTIME AS NEEDED FOR SLEEP 30 tablet 4   No current facility-administered medications on file prior to visit.    Allergies  Allergen Reactions  . Lipitor [Atorvastatin Calcium]     Severe muscle spasms   Social History   Socioeconomic History  . Marital status: Married    Spouse name: Jackelyn Poling  . Number of children: 0  . Years of education: HS  . Highest education level: Not on file  Occupational History    Employer: [employer]     Comment: Product manager  Social Needs  . Financial resource strain: Not on file  . Food insecurity    Worry: Not on file    Inability: Not on file  . Transportation needs    Medical: Not on file    Non-medical: Not on file  Tobacco Use  . Smoking status: Never Smoker  . Smokeless tobacco: Never Used  Substance and Sexual Activity  . Alcohol use: Yes    Comment: 4-5 BEERS PER WK  . Drug use: Yes    Types: Marijuana  . Sexual activity: Yes  Lifestyle  . Physical activity    Days per week: Not on file    Minutes per session: Not on file  . Stress: Not on file  Relationships  . Social Herbalist on phone: Not on file    Gets together: Not on file    Attends religious service: Not on file    Active member of club or organization: Not on file    Attends meetings of clubs or organizations: Not on file    Relationship status: Not on file  . Intimate partner violence    Fear of current or ex partner: Not on file    Emotionally abused: Not on file    Physically abused: Not on file    Forced sexual activity: Not on file  Other Topics Concern  . Not on file  Social History Narrative   Patient lives at home with spouse.   Caffeine Use:4 16oz Mt. Dew sodas daily      Review of Systems  All other systems reviewed and are negative.      Objective:   Physical Exam Vitals signs reviewed.  Constitutional:      General: He is not in acute distress.    Appearance: Normal appearance. He is normal weight. He is not ill-appearing or toxic-appearing.  HENT:     Head: Normocephalic and atraumatic.  Cardiovascular:     Rate and Rhythm: Normal rate and regular rhythm.     Pulses: Normal pulses.     Heart sounds: Normal heart sounds. No murmur. No friction rub. No gallop.   Pulmonary:     Effort: Pulmonary effort is normal. No respiratory distress.     Breath sounds: Normal breath sounds. No stridor. No wheezing, rhonchi or rales.  Chest:     Chest wall: No  tenderness.  Abdominal:     General: Abdomen is flat. Bowel sounds are normal.     Palpations: Abdomen is soft.     Tenderness: There is no abdominal tenderness. There is no guarding.  Musculoskeletal:  Right lower leg: No edema.     Left lower leg: No edema.  Neurological:     Mental Status: He is alert.           Assessment & Plan:  Benign essential HTN - Plan: CBC with Differential, COMPLETE METABOLIC PANEL WITH GFR, Lipid Panel  Essential hypertension  Elevated lipids  Rupture of right distal biceps tendon, sequela  Primary insomnia  Blood pressure today is well controlled.  I will make no changes in his antihypertensive medication.  Check CMP and fasting lipid panel.  Goal LDL cholesterol is less than 100.  Continue Crestor at the present time as the patient is suffering no ill side effects.  Did recommend that the patient could discontinue aspirin as he has no evidence of coronary artery disease.  Patient has chronic pain in his right bicep secondary to a distal biceps tendon rupture.  I will continue to refill the patient's tramadol that he uses 1-2 times a day for right arm pain.  I will also continue to refill the patient's Ambien that he uses for insomnia.  However the Lexapro seems to be working well for his generalized anxiety disorder.

## 2019-12-02 ENCOUNTER — Other Ambulatory Visit: Payer: Self-pay | Admitting: Family Medicine

## 2019-12-02 LAB — COMPLETE METABOLIC PANEL WITH GFR
AG Ratio: 1.7 (calc) (ref 1.0–2.5)
ALT: 37 U/L (ref 9–46)
AST: 23 U/L (ref 10–35)
Albumin: 4.3 g/dL (ref 3.6–5.1)
Alkaline phosphatase (APISO): 56 U/L (ref 35–144)
BUN: 14 mg/dL (ref 7–25)
CO2: 28 mmol/L (ref 20–32)
Calcium: 9.4 mg/dL (ref 8.6–10.3)
Chloride: 105 mmol/L (ref 98–110)
Creat: 0.81 mg/dL (ref 0.70–1.33)
GFR, Est African American: 113 mL/min/{1.73_m2} (ref >=60)
GFR, Est Non African American: 97 mL/min/{1.73_m2} (ref >=60)
Globulin: 2.5 g/dL (calc) (ref 1.9–3.7)
Glucose, Bld: 118 mg/dL — ABNORMAL HIGH (ref 65–99)
Potassium: 5.1 mmol/L (ref 3.5–5.3)
Sodium: 142 mmol/L (ref 135–146)
Total Bilirubin: 0.6 mg/dL (ref 0.2–1.2)
Total Protein: 6.8 g/dL (ref 6.1–8.1)

## 2019-12-02 LAB — LIPID PANEL
Cholesterol: 164 mg/dL (ref <200)
HDL: 47 mg/dL (ref >=40)
LDL Cholesterol (Calc): 83 mg/dL (calc)
Non-HDL Cholesterol (Calc): 117 mg/dL (calc) (ref <130)
Total CHOL/HDL Ratio: 3.5 (calc) (ref <5.0)
Triglycerides: 242 mg/dL — ABNORMAL HIGH (ref <150)

## 2019-12-02 LAB — CBC WITH DIFFERENTIAL/PLATELET
Absolute Monocytes: 513 cells/uL (ref 200–950)
Basophils Absolute: 41 cells/uL (ref 0–200)
Basophils Relative: 0.7 %
Eosinophils Absolute: 100 cells/uL (ref 15–500)
Eosinophils Relative: 1.7 %
HCT: 43 % (ref 38.5–50.0)
Hemoglobin: 14.7 g/dL (ref 13.2–17.1)
Lymphs Abs: 1434 cells/uL (ref 850–3900)
MCH: 31.3 pg (ref 27.0–33.0)
MCHC: 34.2 g/dL (ref 32.0–36.0)
MCV: 91.7 fL (ref 80.0–100.0)
MPV: 11.2 fL (ref 7.5–12.5)
Monocytes Relative: 8.7 %
Neutro Abs: 3811 cells/uL (ref 1500–7800)
Neutrophils Relative %: 64.6 %
Platelets: 250 10*3/uL (ref 140–400)
RBC: 4.69 10*6/uL (ref 4.20–5.80)
RDW: 12.2 % (ref 11.0–15.0)
Total Lymphocyte: 24.3 %
WBC: 5.9 10*3/uL (ref 3.8–10.8)

## 2019-12-02 LAB — HEMOGLOBIN A1C W/OUT EAG: Hgb A1c MFr Bld: 5.8 % of total Hgb — ABNORMAL HIGH (ref <5.7)

## 2019-12-02 LAB — TEST AUTHORIZATION

## 2019-12-04 NOTE — Telephone Encounter (Signed)
Ok to refill??  Last office visit 11/28/2019.  Last refill 11/06/2019.

## 2019-12-06 ENCOUNTER — Encounter: Payer: Self-pay | Admitting: Family Medicine

## 2019-12-24 ENCOUNTER — Other Ambulatory Visit: Payer: Self-pay | Admitting: Gastroenterology

## 2019-12-30 ENCOUNTER — Other Ambulatory Visit: Payer: Self-pay | Admitting: Family Medicine

## 2020-01-02 ENCOUNTER — Other Ambulatory Visit: Payer: Self-pay | Admitting: Family Medicine

## 2020-01-23 ENCOUNTER — Other Ambulatory Visit: Payer: Self-pay | Admitting: Family Medicine

## 2020-01-23 NOTE — Telephone Encounter (Signed)
Ok to refill??  Last office visit 11/28/2019.  Last refill 08/31/2019.

## 2020-01-30 ENCOUNTER — Other Ambulatory Visit: Payer: Self-pay | Admitting: Gastroenterology

## 2020-01-30 ENCOUNTER — Telehealth: Payer: Self-pay | Admitting: Family Medicine

## 2020-01-30 NOTE — Telephone Encounter (Signed)
Pt called and states that he was bitten by a mouse 2 weeks ago that his cat brought into his house and was wondering if he needed to do anything about it? (His last Tdap was 2015)  He is not currently having any symptoms or signs of infection, (other then sudden craving for cheese -  Pt's words not mine). ?

## 2020-01-30 NOTE — Telephone Encounter (Signed)
Patient was advised of Dr. Samella Parr response to his question. Patient verbalized that he understood what Dr. Dennard Schaumann wrote.

## 2020-01-30 NOTE — Telephone Encounter (Signed)
If it was 2 weeks ago, I do not feel he has any risk of rabies or infection.  Tetanus is up to date.

## 2020-02-04 ENCOUNTER — Other Ambulatory Visit: Payer: Self-pay | Admitting: Family Medicine

## 2020-02-05 NOTE — Telephone Encounter (Signed)
Ok to refill??  Last office visit 11/28/2019.  Last refill 12/04/2019.

## 2020-02-28 ENCOUNTER — Other Ambulatory Visit: Payer: Self-pay | Admitting: Family Medicine

## 2020-03-01 ENCOUNTER — Other Ambulatory Visit: Payer: Self-pay | Admitting: Family Medicine

## 2020-03-01 NOTE — Telephone Encounter (Signed)
Ok to refill??  Last office visit 11/28/2019.  Last refill 03/01/2020.

## 2020-03-01 NOTE — Telephone Encounter (Signed)
Ok to refill??  Last office visit 11/28/2019.  Last refill 02/05/2020

## 2020-03-18 ENCOUNTER — Ambulatory Visit: Payer: Medicare HMO | Attending: Internal Medicine

## 2020-03-18 DIAGNOSIS — Z23 Encounter for immunization: Secondary | ICD-10-CM

## 2020-03-18 NOTE — Progress Notes (Signed)
   Covid-19 Vaccination Clinic  Name:  Curtis Lucas    MRN: [id number] DOB: 10/21/1961  03/18/2020  [name] was observed post Covid-19 immunization for 15 minutes without incident. He was provided with Vaccine Information Sheet and instruction to access the V-Safe system.   [name] was instructed to call 911 with any severe reactions post vaccine: [name] Difficulty breathing  . Swelling of face and throat  . A fast heartbeat  . A bad rash all over body  . Dizziness and weakness   Immunizations Administered    Name Date Dose VIS Date Route   Pfizer COVID-19 Vaccine 03/18/2020  8:46 AM 0.3 mL 12/01/2019 Intramuscular   Manufacturer: [place]   Lot: [id number]   [place]: [id number]

## 2020-04-08 ENCOUNTER — Other Ambulatory Visit: Payer: Self-pay | Admitting: Family Medicine

## 2020-04-09 ENCOUNTER — Ambulatory Visit: Payer: Medicare HMO | Attending: Internal Medicine

## 2020-04-09 DIAGNOSIS — Z23 Encounter for immunization: Secondary | ICD-10-CM

## 2020-04-09 NOTE — Telephone Encounter (Signed)
Requesting refill    Tramadol  LOV: 11/28/2019  LRF:  03/04/2020

## 2020-04-09 NOTE — Progress Notes (Signed)
   Covid-19 Vaccination Clinic  Name:  Curtis Lucas    MRN: [id number] DOB: 14-Feb-1961  04/09/2020  Curtis Lucas was observed post Covid-19 immunization for 15 minutes without incident. He was provided with Vaccine Information Sheet and instruction to access the V-Safe system.   [name] was instructed to call 911 with any severe reactions post vaccine: [name] Difficulty breathing  . Swelling of face and throat  . A fast heartbeat  . A bad rash all over body  . Dizziness and weakness   Immunizations Administered    Name Date Dose VIS Date Route   Pfizer COVID-19 Vaccine 04/09/2020 12:28 PM 0.3 mL 02/14/2019 Intramuscular   Manufacturer: [place]   Lot: [id number]   [place]: [id number]

## 2020-04-15 ENCOUNTER — Encounter: Payer: Self-pay | Admitting: Family Medicine

## 2020-04-15 ENCOUNTER — Ambulatory Visit (INDEPENDENT_AMBULATORY_CARE_PROVIDER_SITE_OTHER): Payer: Medicare HMO | Admitting: Family Medicine

## 2020-04-15 ENCOUNTER — Other Ambulatory Visit: Payer: Self-pay

## 2020-04-15 VITALS — BP 132/74 | HR 70 | Temp 97.6°F | Resp 16 | Ht 70.0 in | Wt 219.0 lb

## 2020-04-15 DIAGNOSIS — A6920 Lyme disease, unspecified: Secondary | ICD-10-CM | POA: Diagnosis not present

## 2020-04-15 MED ORDER — DOXYCYCLINE HYCLATE 100 MG PO TABS: 100.0000 mg | ORAL_TABLET | Freq: Two times a day (BID) | ORAL | 0 refills | Status: DC

## 2020-04-15 NOTE — Progress Notes (Signed)
Subjective:    Patient ID: Curtis Lucas, male    DOB: 08-Mar-1961, 59 y.o.   MRN: [id number]  Patient found a tick on his abdomen 1 week ago.  The tick had apparently been feeding on him for at least a day.  Patient was able to remove the tick in its entirety.  However starting Friday of last week he noticed a spreading red rash emanating from the tick bite.  Please see the photograph below.  Rash is approximately 6 inches in diameter.  He also reports headache and some neck stiffness but denies any fevers or chills. Past Medical History:  Diagnosis Date  . Allergy    Rhinitis  . Anxiety   . Arthritis   . Biceps rupture, distal, right, initial encounter   . CVA (cerebral vascular accident) ([place])   . Depression   . Difficulty sleeping   . Elevated lipids   . GERD (gastroesophageal reflux disease)   . Hyperglycemia   . Hyperlipidemia   . Hypertension   . Insomnia    Past Surgical History:  Procedure Laterality Date  . COLONOSCOPY  09/24/2003   [place]--normal per pt.  [name] DISTAL BICEPS TENDON REPAIR Right 02/24/2017   Procedure: RIGHT DISTAL BICEPS TENDON REPAIR;  Surgeon: Leandrew Koyanagi, MD;  Location: [facility];  Service: Orthopedics;  Laterality: Right;  . HEAD INJURY   1987  . JOINT REPLACEMENT    . LUMBAR EPIDURAL INJECTION    . RECTAL SURGERY  2006  . TOTAL HIP ARTHROPLASTY    . TOTAL HIP ARTHROPLASTY  05/03/2012   Procedure: TOTAL HIP ARTHROPLASTY ANTERIOR APPROACH;  Surgeon: Mauri Pole, MD;  Location: [facility];  Service: Orthopedics;  Laterality: Right;   Current Outpatient Medications on File Prior to Visit  Medication Sig Dispense Refill  . amLODipine (NORVASC) 10 MG tablet TAKE ONE TABLET BY MOUTH DAILY 30 tablet 1  . aspirin 81 MG tablet Take 81 mg by mouth daily.    [name] atenolol (TENORMIN) 100 MG tablet TAKE ONE TABLET BY MOUTH DAILY 90 tablet 0  . dicyclomine (BENTYL) 10 MG capsule TAKE ONE CAPSULE BY MOUTH THREE TIMES A DAY AS NEEDED FOR SPASMS  60 capsule 0  . escitalopram (LEXAPRO) 20 MG tablet TAKE ONE TABLET BY MOUTH DAILY (Patient taking differently: Take 20 mg by mouth daily. ) 90 tablet 3  . nitroGLYCERIN (NITROSTAT) 0.4 MG SL tablet Place 1 tablet (0.4 mg total) under the tongue every 5 (five) minutes as needed for chest pain. 30 tablet 0  . Omega-3 Fatty Acids (FISH OIL) 1200 MG CAPS Take 1 capsule by mouth at bedtime.     . pantoprazole (PROTONIX) 40 MG tablet TAKE ONE TABLET BY MOUTH DAILY 90 tablet 3  . rosuvastatin (CRESTOR) 20 MG tablet TAKE ONE TABLET BY MOUTH DAILY. DISCONTINUE PRAVASTATIN 90 tablet 1  . traMADol (ULTRAM) 50 MG tablet TAKE ONE TABLET BY MOUTH EVERY 6 HOURS AS NEEDED 30 tablet 0  . zolpidem (AMBIEN) 10 MG tablet TAKE ONE TABLET BY MOUTH EVERY NIGHT AT BEDTIME AS NEEDED FOR SLEEP 30 tablet 3   No current facility-administered medications on file prior to visit.   Allergies  Allergen Reactions  . Lipitor [Atorvastatin Calcium]     Severe muscle spasms   Social History   Socioeconomic History  . Marital status: Married    Spouse name: Jackelyn Poling  . Number of children: 0  . Years of education: HS  . Highest education level: Not  on file  Occupational History    Employer: [employer]    Comment: [employer]  Tobacco Use  . Smoking status: Never Smoker  . Smokeless tobacco: Never Used  Substance and Sexual Activity  . Alcohol use: Yes    Comment: 4-5 BEERS PER WK  . Drug use: Yes    Types: Marijuana  . Sexual activity: Yes  Other Topics Concern  . Not on file  Social History Narrative   Patient lives at home with spouse.   Caffeine Use:4 16oz Mt. Dew sodas daily   Social Determinants of Health   Financial Resource Strain:   . Difficulty of Paying Living Expenses:   Food Insecurity:   . Worried About Charity fundraiser in the Last Year:   . Arboriculturist in the Last Year:   Transportation Needs:   . Film/video editor (Medical):   [name] Lack of Transportation  (Non-Medical):   Physical Activity:   . Days of Exercise per Week:   . Minutes of Exercise per Session:   Stress:   . Feeling of Stress :   Social Connections:   . Frequency of Communication with Friends and Family:   . Frequency of Social Gatherings with Friends and Family:   . Attends Religious Services:   . Active Member of Clubs or Organizations:   . Attends Archivist Meetings:   [name] Marital Status:   Intimate Partner Violence:   . Fear of Current or Ex-Partner:   . Emotionally Abused:   [name] Physically Abused:   . Sexually Abused:       Review of Systems  All other systems reviewed and are negative.      Objective:   Physical Exam Vitals reviewed.  Constitutional:      General: He is not in acute distress.    Appearance: Normal appearance. He is normal weight. He is not ill-appearing or toxic-appearing.  HENT:     Head: Normocephalic and atraumatic.  Cardiovascular:     Rate and Rhythm: Normal rate and regular rhythm.     Pulses: Normal pulses.     Heart sounds: Normal heart sounds. No murmur. No friction rub. No gallop.   Pulmonary:     Effort: Pulmonary effort is normal. No respiratory distress.     Breath sounds: Normal breath sounds. No stridor. No wheezing, rhonchi or rales.  Chest:     Chest wall: No tenderness.  Abdominal:     General: Abdomen is flat. Bowel sounds are normal.     Palpations: Abdomen is soft.     Tenderness: There is no abdominal tenderness. There is no guarding.  Musculoskeletal:     Right lower leg: No edema.     Left lower leg: No edema.  Skin:    Findings: Erythema and rash present. Rash is macular.       Neurological:     Mental Status: He is alert.             Assessment & Plan:  Lyme disease with erythema migrans lesion 5 cm or greater in diameter  Begin doxycycline 100 mg p.o. twice daily for 21 days.  Recheck if no better in 1 week or sooner if worsening.

## 2020-04-24 ENCOUNTER — Telehealth: Payer: Self-pay | Admitting: Family Medicine

## 2020-04-24 ENCOUNTER — Other Ambulatory Visit: Payer: Self-pay | Admitting: Family Medicine

## 2020-04-24 NOTE — Chronic Care Management (AMB) (Signed)
  Chronic Care Management   Note  04/24/2020 Name: Curtis Lucas MRN: [id number] DOB: August 26, 1961  Curtis Lucas is a 59 y.o. year old male who is a primary care patient of Pickard, Cammie Mcgee, MD. I reached out to Curtis Lucas by phone today in response to a referral sent by [name]'s PCP, Susy Frizzle, MD.   [name] was given information about Chronic Care Management services today including:  1. CCM service includes personalized support from designated clinical staff supervised by his physician, including individualized plan of care and coordination with other care providers 2. 24/7 contact phone numbers for assistance for urgent and routine care needs. 3. Service will only be billed when office clinical staff spend 20 minutes or more in a month to coordinate care. 4. Only one practitioner may furnish and bill the service in a calendar month. 5. The patient may stop CCM services at any time (effective at the end of the month) by phone call to the office staff.   Patient agreed to services and verbal consent obtained.   Follow up plan:   [place]

## 2020-04-25 NOTE — Telephone Encounter (Signed)
Ok to refill??  Last office visit 04/15/2020.  Last refill 04/09/2020.

## 2020-04-28 ENCOUNTER — Other Ambulatory Visit: Payer: Self-pay | Admitting: Family Medicine

## 2020-05-03 ENCOUNTER — Other Ambulatory Visit: Payer: Self-pay | Admitting: Family Medicine

## 2020-05-03 DIAGNOSIS — F5101 Primary insomnia: Secondary | ICD-10-CM

## 2020-05-03 DIAGNOSIS — E782 Mixed hyperlipidemia: Secondary | ICD-10-CM

## 2020-05-03 DIAGNOSIS — I10 Essential (primary) hypertension: Secondary | ICD-10-CM

## 2020-05-07 NOTE — Chronic Care Management (AMB) (Addendum)
Chronic Care Management Pharmacy  Name: Curtis Lucas  MRN: [id number] DOB: 01-28-1961   Chief Complaint/ HPI  Curtis Lucas,  59 y.o. , male presents for their Initial CCM visit with the clinical pharmacist via telephone.  PCP : Susy Frizzle, MD  Their chronic conditions include: hypertension, GERD, hyperlipidemia.   Office Visits:  04/15/2020 Dennard Schaumann) - had lyme disease from tick bite, given doxycycline 100mg  twice daily for 21 days  11/28/2019 (Pickard) - goal LDL < 100, recommended he could d/c ASA since no CAD, no other med changes noted  Consult Visit:  [id number] Roxanne Mins, ED) - came in for chest pain, normal ECG, normal chest x -ray, discharged home no changes  Medications: Outpatient Encounter Medications as of 05/08/2020  Medication Sig   amLODipine (NORVASC) 10 MG tablet TAKE ONE TABLET BY MOUTH DAILY   aspirin 81 MG tablet Take 81 mg by mouth daily.   atenolol (TENORMIN) 100 MG tablet TAKE ONE TABLET BY MOUTH DAILY   dicyclomine (BENTYL) 10 MG capsule TAKE ONE CAPSULE BY MOUTH THREE TIMES A DAY AS NEEDED FOR SPASMS   doxycycline (VIBRA-TABS) 100 MG tablet Take 1 tablet (100 mg total) by mouth 2 (two) times daily.   escitalopram (LEXAPRO) 20 MG tablet TAKE ONE TABLET BY MOUTH DAILY (Patient taking differently: Take 20 mg by mouth daily. )   nitroGLYCERIN (NITROSTAT) 0.4 MG SL tablet Place 1 tablet (0.4 mg total) under the tongue every 5 (five) minutes as needed for chest pain.   Omega-3 Fatty Acids (FISH OIL) 1200 MG CAPS Take 1 capsule by mouth at bedtime.    pantoprazole (PROTONIX) 40 MG tablet TAKE ONE TABLET BY MOUTH DAILY   rosuvastatin (CRESTOR) 20 MG tablet TAKE ONE TABLET BY MOUTH DAILY. DISCONTINUE PRAVASTATIN   traMADol (ULTRAM) 50 MG tablet TAKE ONE TABLET BY MOUTH EVERY 6 HOURS AS NEEDED   zolpidem (AMBIEN) 10 MG tablet TAKE ONE TABLET BY MOUTH EVERY NIGHT AT BEDTIME AS NEEDED FOR SLEEP   No facility-administered encounter medications on  file as of 05/08/2020.    Current Diagnosis/Assessment:   Merchant navy officer: Low Risk    Difficulty of Paying Living Expenses: Not very hard    Goals Addressed   None     Hypertension    Office blood pressures are  BP Readings from Last 3 Encounters:  04/15/20 132/74  11/28/19 138/86  10/03/19 117/76    Patient has failed these meds in the past: none noted  Patient checks BP at home infrequently  Patient home BP readings are ranging: 120s/80s whenever he does get a chance to check it  Patient is currently controlled on the following medications: amlodipine 10mg  daily, atenolol 100mg  daily  Currently does not check BP unless he is somewhere with a free monitor.  Always WNL when he does get a chance to check it and controlled at all office visits.  No complaints with current medications.  So s/sx of hypotension.  Plan  Continue current medications     Hyperlipidemia   Lipid Panel     Component Value Date/Time   CHOL 164 11/28/2019 1243   TRIG 242 (H) 11/28/2019 1243   HDL 47 11/28/2019 1243   CHOLHDL 3.5 11/28/2019 1243   VLDL 10 08/11/2017 1135   LDLCALC 83 11/28/2019 1243     Patient has failed these meds in past: none noted Patient is currently uncontrolled on the following medications: rosuvastatin 20mg  daily  Triglycerides elevated.  Discussed dietary reasons why this  could be elevated and encouraged patient to limit fried/fatty foods.  Currently tolerating statin and appropriate time of administration.  Plan  Continue current medications.  Focus on servings of fatty/fried foods.  GERD    Patient has failed these meds in past: none noted Patient is currently controlled on the following medications: pantoprazole 40mg    Plan  Continue current medications   Vaccines   Reviewed and discussed patient's vaccination history.    Immunization History  Administered Date(s) Administered   PFIZER SARS-COV-2 Vaccination 03/18/2020, 04/09/2020    Td 07/22/2003   Tdap 04/20/2014    Plan  Recommended patient receive shingles vaccine in office/pharmacy.   Medication Management   Miscellaneous medications: zolpidem 10mg  tablets, dicyclomine 10mg  capsule, escitalopram 20mg  tablets OTC's: Fish Oil 1200mg , ASA 81mg  Patient currently uses [facility].  Phone #  [phone] Patient reports using no specific method to organize medications and promote adherence. Patient denies missed doses of medication.   Beverly Milch, PharmD Clinical Pharmacist [place] [phone]  I have collaborated with the care management provider regarding care management and care coordination activities outlined in this encounter and have reviewed this encounter including documentation in the note and care plan. I am certifying that I agree with the content of this note and encounter as supervising physician.

## 2020-05-08 ENCOUNTER — Other Ambulatory Visit: Payer: Self-pay

## 2020-05-08 ENCOUNTER — Ambulatory Visit: Payer: Medicare HMO | Admitting: Pharmacist

## 2020-05-08 DIAGNOSIS — E782 Mixed hyperlipidemia: Secondary | ICD-10-CM

## 2020-05-08 DIAGNOSIS — I10 Essential (primary) hypertension: Secondary | ICD-10-CM

## 2020-05-08 NOTE — Patient Instructions (Addendum)
Thank you for meeting with me today!  I look forward to working with you to help you meet all of your healthcare goals and answer any questions you may have.  Feel free to contact me anytime!   Visit Information  Goals Addressed            This Visit's Progress   . Pharmacy Care Plan:       CARE PLAN ENTRY  Current Barriers:  . Chronic Disease Management support, education, and care coordination needs related to Hypertension and Hyperlipidemia   Hypertension . Pharmacist Clinical Goal(s): o Over the next 180 days, patient will work with PharmD and providers to maintain BP goal <140/90 . Current regimen:  o Amlodipine 10mg  daily, atenolol 100mg  daily . Interventions: o Comprehensive medication review o Continue current therapy . Patient self care activities - Over the next 180 days, patient will: o Check BP as needed, document, and provide at future appointments o Ensure daily salt intake < 2300 mg/day o Contact PharmD or PCP with blood pressure readings > 140/90  Hyperlipidemia . Pharmacist Clinical Goal(s): o Over the next 180 days, patient will work with PharmD and providers to maintain LDL goal < 100 and work to lower triglycerides. . Current regimen:  o Rosuvastatin 20mg  daily . Interventions: o Comprehensive medication review o Continue current therapy . Patient self care activities - Over the next 180 days, patient will: o Continue to take medication as directed o Limit servings of fried/fatty foods to 2 to 3 times per week to lower triglycerides o Contact PharmD or PCP with any medication related concerns.   Initial goal documentation        Mr. Curtis Lucas was given information about Chronic Care Management services today including:  1. CCM service includes personalized support from designated clinical staff supervised by his physician, including individualized plan of care and coordination with other care providers 2. 24/7 contact phone numbers for assistance  for urgent and routine care needs. 3. Standard insurance, coinsurance, copays and deductibles apply for chronic care management only during months in which we provide at least 20 minutes of these services. Most insurances cover these services at 100%, however patients may be responsible for any copay, coinsurance and/or deductible if applicable. This service may help you avoid the need for more expensive face-to-face services. 4. Only one practitioner may furnish and bill the service in a calendar month. 5. The patient may stop CCM services at any time (effective at the end of the month) by phone call to the office staff.  Patient agreed to services and verbal consent obtained.   The patient verbalized understanding of instructions provided today and agreed to receive a mailed copy of patient instruction and/or educational materials. Telephone follow up appointment with pharmacy team member scheduled for: 11/12/2020 @ 12:30 PM  Beverly Milch, PharmD Clinical Pharmacist [facility] [phone]   High Triglycerides Eating Plan Triglycerides are a type of fat in the blood. High levels of triglycerides can increase your risk of heart disease and stroke. If your triglyceride levels are high, choosing the right foods can help lower your triglycerides and keep your heart healthy. Work with your health care provider or a diet and nutrition specialist (dietitian) to develop an eating plan that is right for you. What are tips for following this plan? General guidelines   Lose weight, if you are overweight. For most people, losing 5-10 lbs (2-5 kg) helps lower triglyceride levels. A weight-loss plan may include. ?  30 minutes of exercise at least 5 days a week. ? Reducing the amount of calories, sugar, and fat you eat.  Eat a wide variety of fresh fruits, vegetables, and whole grains. These foods are high in fiber.  Eat foods that contain healthy fats, such as fatty fish, nuts,  seeds, and olive oil.  Avoid foods that are high in added sugar, added salt (sodium), saturated fat, and trans fat.  Avoid low-fiber, refined carbohydrates such as white bread, crackers, noodles, and white rice.  Avoid foods with partially hydrogenated oils (trans fats), such as fried foods or stick margarine.  Limit alcohol intake to no more than 1 drink a day for nonpregnant women and 2 drinks a day for men. One drink equals 12 oz of beer, 5 oz of wine, or 1 oz of hard liquor. Your health care provider may recommend that you drink less depending on your overall health. Reading food labels  Check food labels for the amount of saturated fat. Choose foods with no or very little saturated fat.  Check food labels for the amount of trans fat. Choose foods with no trans fat.  Check food labels for the amount of cholesterol. Choose foods low in cholesterol. Ask your dietitian how much cholesterol you should have each day.  Check food labels for the amount of sodium. Choose foods with less than 140 milligrams (mg) per serving. Shopping  Buy dairy products labeled as nonfat (skim) or low-fat (1%).  Avoid buying processed or prepackaged foods. These are often high in added sugar, sodium, and fat. Cooking  Choose healthy fats when cooking, such as olive oil or canola oil.  Cook foods using lower fat methods, such as baking, broiling, boiling, or grilling.  Make your own sauces, dressings, and marinades when possible, instead of buying them. Store-bought sauces, dressings, and marinades are often high in sodium and sugar. Meal planning  Eat more home-cooked food and less restaurant, buffet, and fast food.  Eat fatty fish at least 2 times each week. Examples of fatty fish include salmon, trout, mackerel, tuna, and herring.  If you eat whole eggs, do not eat more than 3 egg yolks per week. What foods are recommended? The items listed may not be a complete list. Talk with your dietitian  about what dietary choices are best for you. Grains Whole wheat or whole grain breads, crackers, cereals, and pasta. Unsweetened oatmeal. Bulgur. Barley. Quinoa. Brown rice. Whole wheat flour tortillas. Vegetables Fresh or frozen vegetables. Low-sodium canned vegetables. Fruits All fresh, canned (in natural juice), or frozen fruits. Meats and other protein foods Skinless chicken or [place]. Ground chicken or [place]. Lean cuts of pork, trimmed of fat. Fish and seafood, especially salmon, trout, and herring. Egg whites. Dried beans, peas, or lentils. Unsalted nuts or seeds. Unsalted canned beans. Natural peanut or almond butter. Dairy Low-fat dairy products. Skim or low-fat (1%) milk. Reduced fat (2%) and low-sodium cheese. Low-fat ricotta cheese. Low-fat cottage cheese. Plain, low-fat yogurt. Fats and oils Tub margarine without trans fats. Light or reduced-fat mayonnaise. Light or reduced-fat salad dressings. Avocado. Safflower, olive, sunflower, soybean, and canola oils. What foods are not recommended? The items listed may not be a complete list. Talk with your dietitian about what dietary choices are best for you. Grains White bread. White (regular) pasta. White rice. Cornbread. Bagels. Pastries. Crackers that contain trans fat. Vegetables Creamed or fried vegetables. Vegetables in a cheese sauce. Fruits Sweetened dried fruit. Canned fruit in syrup. Fruit juice. Meats and other  protein foods Fatty cuts of meat. Ribs. Chicken wings. Berniece Salines. Sausage. Bologna. Salami. Chitterlings. Fatback. Hot dogs. Bratwurst. Packaged lunch meats. Dairy Whole or reduced-fat (2%) milk. Half-and-half. Cream cheese. Full-fat or sweetened yogurt. Full-fat cheese. Nondairy creamers. Whipped toppings. Processed cheese or cheese spreads. Cheese curds. Beverages Alcohol. Sweetened drinks, such as soda, lemonade, fruit drinks, or punches. Fats and oils Butter. Stick margarine. Lard. Shortening. Ghee. Bacon fat.  Tropical oils, such as coconut, palm kernel, or palm oils. Sweets and desserts Corn syrup. Sugars. Honey. Molasses. Candy. Jam and jelly. Syrup. Sweetened cereals. Cookies. Pies. Cakes. Donuts. Muffins. Ice cream. Condiments Store-bought sauces, dressings, and marinades that are high in sugar, such as ketchup and barbecue sauce. Summary  High levels of triglycerides can increase the risk of heart disease and stroke. Choosing the right foods can help lower your triglycerides.  Eat plenty of fresh fruits, vegetables, and whole grains. Choose low-fat dairy and lean meats. Eat fatty fish at least twice a week.  Avoid processed and prepackaged foods with added sugar, sodium, saturated fat, and trans fat.  If you need suggestions or have questions about what types of food are good for you, talk with your health care provider or a dietitian. This information is not intended to replace advice given to you by your health care provider. Make sure you discuss any questions you have with your health care provider. Document Revised: 11/19/2017 Document Reviewed: 02/09/2017 Elsevier Patient Education  2020 [employer].

## 2020-05-21 ENCOUNTER — Other Ambulatory Visit: Payer: Self-pay | Admitting: Family Medicine

## 2020-05-22 NOTE — Telephone Encounter (Signed)
Ok to refill??  Last office visit 04/15/2020.  Last refill on Ambien 01/23/2020, #3 refills.   Last refill on Tramadol 04/25/2020. Ok to add refills to prescription?

## 2020-06-04 ENCOUNTER — Encounter: Payer: Self-pay | Admitting: Nurse Practitioner

## 2020-06-04 ENCOUNTER — Ambulatory Visit (INDEPENDENT_AMBULATORY_CARE_PROVIDER_SITE_OTHER): Payer: Medicare HMO | Admitting: Nurse Practitioner

## 2020-06-04 ENCOUNTER — Encounter: Payer: Self-pay | Admitting: Family Medicine

## 2020-06-04 ENCOUNTER — Other Ambulatory Visit: Payer: Self-pay

## 2020-06-04 VITALS — BP 140/92 | HR 89 | Temp 98.4°F | Resp 18 | Wt 213.2 lb

## 2020-06-04 DIAGNOSIS — R109 Unspecified abdominal pain: Secondary | ICD-10-CM

## 2020-06-04 DIAGNOSIS — Z125 Encounter for screening for malignant neoplasm of prostate: Secondary | ICD-10-CM | POA: Diagnosis not present

## 2020-06-04 DIAGNOSIS — I10 Essential (primary) hypertension: Secondary | ICD-10-CM

## 2020-06-04 DIAGNOSIS — R1011 Right upper quadrant pain: Secondary | ICD-10-CM | POA: Diagnosis not present

## 2020-06-04 DIAGNOSIS — E782 Mixed hyperlipidemia: Secondary | ICD-10-CM

## 2020-06-04 DIAGNOSIS — R739 Hyperglycemia, unspecified: Secondary | ICD-10-CM

## 2020-06-04 LAB — URINALYSIS, ROUTINE W REFLEX MICROSCOPIC
Bilirubin Urine: NEGATIVE
Glucose, UA: NEGATIVE
Hgb urine dipstick: NEGATIVE
Ketones, ur: NEGATIVE
Leukocytes,Ua: NEGATIVE
Nitrite: NEGATIVE
Protein, ur: NEGATIVE
Specific Gravity, Urine: 1.02 (ref 1.001–1.03)
pH: 5.5 (ref 5.0–8.0)

## 2020-06-04 NOTE — Patient Instructions (Addendum)
Decrease or stop alcohol intake and Drink plenty of water,   Complete order imaging and labs today  Follow up tomorrow for results  seek medical attention for non resolving or worsening sxs  Make appt with Dr Dennard Schaumann your Annual is due.

## 2020-06-04 NOTE — Progress Notes (Signed)
Established Patient Office Visit  Subjective:  Patient ID: Curtis Lucas, male    DOB: 1961/03/04  Age: 59 y.o. MRN: [id number]  CC:  Chief Complaint  Patient presents with  . Abdominal Pain    R side, started 06/10, pressing on it causing pain and coughing, tylenol wsa taken    HPI Curtis Lucas is a 59 year old male presenting to clinic with c/o right upper abdominal pain that started on Thursday. The sxs has remained the same since onset not worsening. He has tried tylenol without relief. Pain described as dull achy.  He does report drinking 1 box of wine every other day along with several beers "no number". The pt admits to having dark colored urine and yellow to orange bowel movements over the past 1-2 weeks. Denied cp, ct, other gu, gi sxs, other pain, sob, edema, skin discoloration, or recent heavy lifting or injury/fall. No fever, chills.   Past Medical History:  Diagnosis Date  . Allergy    Rhinitis  . Anxiety   . Arthritis   . Biceps rupture, distal, right, initial encounter   . CVA (cerebral vascular accident) ([place])   . Depression   . Difficulty sleeping   . Elevated lipids   . GERD (gastroesophageal reflux disease)   . Hyperglycemia   . Hyperlipidemia   . Hypertension   . Insomnia     Past Surgical History:  Procedure Laterality Date  . COLONOSCOPY  09/24/2003   patterson--normal per pt.  [name] DISTAL BICEPS TENDON REPAIR Right 02/24/2017   Procedure: RIGHT DISTAL BICEPS TENDON REPAIR;  Surgeon: Leandrew Koyanagi, MD;  Location: [facility];  Service: Orthopedics;  Laterality: Right;  . HEAD INJURY   1987  . JOINT REPLACEMENT    . LUMBAR EPIDURAL INJECTION    . RECTAL SURGERY  2006  . TOTAL HIP ARTHROPLASTY    . TOTAL HIP ARTHROPLASTY  05/03/2012   Procedure: TOTAL HIP ARTHROPLASTY ANTERIOR APPROACH;  Surgeon: Mauri Pole, MD;  Location: [facility];  Service: Orthopedics;  Laterality: Right;    Family History  Problem Relation Age of Onset  .  Stroke Mother   . Cancer Mother        colon  . Dementia Mother   . Colon cancer Mother   . Parkinson's disease Father   . Heart attack Paternal Grandfather   . Dementia Paternal Grandmother   . Colon polyps Neg Hx   . Esophageal cancer Neg Hx   . Rectal cancer Neg Hx   . Stomach cancer Neg Hx     Social History   Socioeconomic History  . Marital status: Married    Spouse name: Jackelyn Poling  . Number of children: 0  . Years of education: HS  . Highest education level: Not on file  Occupational History    Employer: [employer]    Comment: [employer]  Tobacco Use  . Smoking status: Never Smoker  . Smokeless tobacco: Never Used  Substance and Sexual Activity  . Alcohol use: Yes    Comment: 4-5 BEERS PER WK  . Drug use: Yes    Types: Marijuana  . Sexual activity: Yes  Other Topics Concern  . Not on file  Social History Narrative   Patient lives at home with spouse.   Caffeine Use:4 16oz Mt. Dew sodas daily   Social Determinants of Health   Financial Resource Strain: Low Risk   . Difficulty of Paying Living Expenses: Not very  hard  Food Insecurity:   . Worried About Charity fundraiser in the Last Year:   . Arboriculturist in the Last Year:   Transportation Needs:   . Film/video editor (Medical):   [name] Lack of Transportation (Non-Medical):   Physical Activity:   . Days of Exercise per Week:   . Minutes of Exercise per Session:   Stress:   . Feeling of Stress :   Social Connections:   . Frequency of Communication with Friends and Family:   . Frequency of Social Gatherings with Friends and Family:   . Attends Religious Services:   . Active Member of Clubs or Organizations:   . Attends Archivist Meetings:   [name] Marital Status:   Intimate Partner Violence:   . Fear of Current or Ex-Partner:   . Emotionally Abused:   [name] Physically Abused:   . Sexually Abused:     Outpatient Medications Prior to Visit  Medication Sig Dispense Refill  .  amLODipine (NORVASC) 10 MG tablet TAKE ONE TABLET BY MOUTH DAILY 90 tablet 2  . aspirin 81 MG tablet Take 81 mg by mouth daily.    [name] atenolol (TENORMIN) 100 MG tablet TAKE ONE TABLET BY MOUTH DAILY 90 tablet 0  . escitalopram (LEXAPRO) 20 MG tablet TAKE ONE TABLET BY MOUTH DAILY (Patient taking differently: Take 20 mg by mouth daily. ) 90 tablet 3  . Omega-3 Fatty Acids (FISH OIL) 1200 MG CAPS Take 1 capsule by mouth at bedtime.     . pantoprazole (PROTONIX) 40 MG tablet TAKE ONE TABLET BY MOUTH DAILY 90 tablet 3  . rosuvastatin (CRESTOR) 20 MG tablet TAKE ONE TABLET BY MOUTH DAILY. DISCONTINUE PRAVASTATIN 90 tablet 1  . traMADol (ULTRAM) 50 MG tablet TAKE ONE TABLET BY MOUTH EVERY 6 HOURS AS NEEDED 30 tablet 3  . zolpidem (AMBIEN) 10 MG tablet TAKE ONE TABLET BY MOUTH EVERY NIGHT AT BEDTIME AS NEEDED FOR SLEEP 30 tablet 3  . dicyclomine (BENTYL) 10 MG capsule TAKE ONE CAPSULE BY MOUTH THREE TIMES A DAY AS NEEDED FOR SPASMS (Patient not taking: Reported on 05/08/2020) 60 capsule 0  . doxycycline (VIBRA-TABS) 100 MG tablet Take 1 tablet (100 mg total) by mouth 2 (two) times daily. (Patient not taking: Reported on 05/08/2020) 42 tablet 0  . nitroGLYCERIN (NITROSTAT) 0.4 MG SL tablet Place 1 tablet (0.4 mg total) under the tongue every 5 (five) minutes as needed for chest pain. (Patient not taking: Reported on 05/08/2020) 30 tablet 0   No facility-administered medications prior to visit.    Allergies  Allergen Reactions  . Lipitor [Atorvastatin Calcium]     Severe muscle spasms    ROS Review of Systems  All other systems reviewed and are negative.     Objective:    Physical Exam Vitals and nursing note reviewed.  Constitutional:      Appearance: He is well-developed.  HENT:     Head: Normocephalic.  Eyes:     Extraocular Movements: Extraocular movements intact.     Pupils: Pupils are equal, round, and reactive to light.  Cardiovascular:     Rate and Rhythm: Normal rate.  Abdominal:      General: Abdomen is flat. Bowel sounds are normal. There is no abdominal bruit.     Palpations: Abdomen is soft. There is no shifting dullness, fluid wave, hepatomegaly, splenomegaly, mass or pulsatile mass.     Tenderness: There is abdominal tenderness in the right upper quadrant. There  is no right CVA tenderness, left CVA tenderness, guarding or rebound. Negative signs include Murphy's sign, Rovsing's sign, McBurney's sign, psoas sign and obturator sign.     Hernia: No hernia is present.  Skin:    General: Skin is warm.     Coloration: Skin is not cyanotic, jaundiced or pale.  Neurological:     General: No focal deficit present.     Mental Status: He is alert and oriented to person, place, and time.  Psychiatric:        Mood and Affect: Mood normal.        Behavior: Behavior normal.     BP (!) 140/92 (BP Location: Left Arm, Patient Position: Sitting, Cuff Size: Large)   Pulse 89   Temp 98.4 F (36.9 C) (Temporal)   Resp 18   Wt 213 lb 3.2 oz (96.7 kg)   SpO2 96%   BMI 30.59 kg/m  Wt Readings from Last 3 Encounters:  06/04/20 213 lb 3.2 oz (96.7 kg)  04/15/20 219 lb (99.3 kg)  11/28/19 217 lb (98.4 kg)     There are no preventive care reminders to display for this patient.  There are no preventive care reminders to display for this patient.  Lab Results  Component Value Date   TSH 0.736 05/02/2015   Lab Results  Component Value Date   WBC 5.9 11/28/2019   HGB 14.7 11/28/2019   HCT 43.0 11/28/2019   MCV 91.7 11/28/2019   PLT 250 11/28/2019   Lab Results  Component Value Date   NA 142 11/28/2019   K 5.1 11/28/2019   CO2 28 11/28/2019   GLUCOSE 118 (H) 11/28/2019   BUN 14 11/28/2019   CREATININE 0.81 11/28/2019   BILITOT 0.6 11/28/2019   ALKPHOS 70 08/11/2017   AST 23 11/28/2019   ALT 37 11/28/2019   PROT 6.8 11/28/2019   ALBUMIN 4.5 08/11/2017   CALCIUM 9.4 11/28/2019   ANIONGAP 8 07/28/2019   Lab Results  Component Value Date   CHOL 164  11/28/2019   Lab Results  Component Value Date   HDL 47 11/28/2019   Lab Results  Component Value Date   LDLCALC 83 11/28/2019   Lab Results  Component Value Date   TRIG 242 (H) 11/28/2019   Lab Results  Component Value Date   CHOLHDL 3.5 11/28/2019   Lab Results  Component Value Date   HGBA1C 5.8 (H) 11/28/2019      Assessment & Plan:   Problem List Items Addressed This Visit      Cardiovascular and Mediastinum   Hypertension     Other   Hyperglycemia   Relevant Orders   Hemoglobin A1c   Prostate cancer screening   Relevant Orders   PSA   Mixed hyperlipidemia   Relevant Orders   Lipid panel   CBC with Differential/Platelet   COMPLETE METABOLIC PANEL WITH GFR    Other Visit Diagnoses    Right upper quadrant abdominal pain    -  Primary   Relevant Orders   US Abdomen Limited RUQ   Amylase   Lipase   CBC with Differential/Platelet   COMPLETE METABOLIC PANEL WITH GFR   Abdominal pain, unspecified abdominal location       Relevant Orders   Urinalysis, Routine w reflex microscopic    Decrease or stop alcohol intake and Drink plenty of water,   Complete order imaging and labs today  Follow up tomorrow for results  seek medical attention for non resolving or  worsening sxs  Make appt with Dr Dennard Schaumann your Annual is due.    Follow-up: Return in about 1 day (around 06/05/2020) for follow up abd pain, also make APE with Dr Dennard Schaumann that is due.    Annie Main, FNP

## 2020-06-05 LAB — COMPLETE METABOLIC PANEL WITH GFR
AG Ratio: 1.6 (calc) (ref 1.0–2.5)
ALT: 55 U/L — ABNORMAL HIGH (ref 9–46)
AST: 39 U/L — ABNORMAL HIGH (ref 10–35)
Albumin: 4.4 g/dL (ref 3.6–5.1)
Alkaline phosphatase (APISO): 74 U/L (ref 35–144)
BUN: 13 mg/dL (ref 7–25)
CO2: 27 mmol/L (ref 20–32)
Calcium: 9.5 mg/dL (ref 8.6–10.3)
Chloride: 104 mmol/L (ref 98–110)
Creat: 0.83 mg/dL (ref 0.70–1.25)
GFR, Est African American: 111 mL/min/{1.73_m2} (ref >=60)
GFR, Est Non African American: 96 mL/min/{1.73_m2} (ref >=60)
Globulin: 2.7 g/dL (calc) (ref 1.9–3.7)
Glucose, Bld: 117 mg/dL — ABNORMAL HIGH (ref 65–99)
Potassium: 4.4 mmol/L (ref 3.5–5.3)
Sodium: 143 mmol/L (ref 135–146)
Total Bilirubin: 0.5 mg/dL (ref 0.2–1.2)
Total Protein: 7.1 g/dL (ref 6.1–8.1)

## 2020-06-05 LAB — CBC WITH DIFFERENTIAL/PLATELET
Absolute Monocytes: 481 cells/uL (ref 200–950)
Basophils Absolute: 32 cells/uL (ref 0–200)
Basophils Relative: 0.6 %
Eosinophils Absolute: 70 cells/uL (ref 15–500)
Eosinophils Relative: 1.3 %
HCT: 43.7 % (ref 38.5–50.0)
Hemoglobin: 14.8 g/dL (ref 13.2–17.1)
Lymphs Abs: 1528 cells/uL (ref 850–3900)
MCH: 31.6 pg (ref 27.0–33.0)
MCHC: 33.9 g/dL (ref 32.0–36.0)
MCV: 93.4 fL (ref 80.0–100.0)
MPV: 10.5 fL (ref 7.5–12.5)
Monocytes Relative: 8.9 %
Neutro Abs: 3289 cells/uL (ref 1500–7800)
Neutrophils Relative %: 60.9 %
Platelets: 287 10*3/uL (ref 140–400)
RBC: 4.68 10*6/uL (ref 4.20–5.80)
RDW: 12 % (ref 11.0–15.0)
Total Lymphocyte: 28.3 %
WBC: 5.4 10*3/uL (ref 3.8–10.8)

## 2020-06-05 LAB — LIPID PANEL
Cholesterol: 164 mg/dL (ref <200)
HDL: 50 mg/dL (ref >=40)
LDL Cholesterol (Calc): 80 mg/dL (calc)
Non-HDL Cholesterol (Calc): 114 mg/dL (calc) (ref <130)
Total CHOL/HDL Ratio: 3.3 (calc) (ref <5.0)
Triglycerides: 246 mg/dL — ABNORMAL HIGH (ref <150)

## 2020-06-05 LAB — PSA: PSA: 0.6 ng/mL (ref <=4.0)

## 2020-06-05 LAB — AMYLASE: Amylase: 27 U/L (ref 21–101)

## 2020-06-05 LAB — HEMOGLOBIN A1C
Hgb A1c MFr Bld: 5.6 % of total Hgb (ref <5.7)
Mean Plasma Glucose: 114 (calc)
eAG (mmol/L): 6.3 (calc)

## 2020-06-05 LAB — LIPASE: Lipase: 13 U/L (ref 7–60)

## 2020-06-05 NOTE — Progress Notes (Signed)
1. His AST/ALT is more elevated than prior labs I suspect from the heavy alcohol use: he should cut back on alcohol, if he needs help please let me know but explore what he is willing to do for help. 2. Triglycerides are high: I also suspect secondary to alcohol use. Cut back.  3. Prostate lab normal. 4. Urine normal 5. On the cusp of Dm2 also suspect from alsohol use, cut carbs, sugar, exercise 20 minutes at least 4 times per week 6. Cbc normal, I dod not suspect pancreatis. Pending completing the image that was ordered yesterdays visit.

## 2020-06-06 ENCOUNTER — Ambulatory Visit
Admission: RE | Admit: 2020-06-06 | Discharge: 2020-06-06 | Disposition: A | Discharge status: Home / Self Care | Payer: Medicare HMO | Source: Ambulatory Visit | Attending: Nurse Practitioner | Admitting: Nurse Practitioner

## 2020-06-06 ENCOUNTER — Encounter: Payer: Self-pay | Admitting: Nurse Practitioner

## 2020-06-06 ENCOUNTER — Other Ambulatory Visit: Payer: Self-pay | Admitting: Nurse Practitioner

## 2020-06-06 ENCOUNTER — Other Ambulatory Visit: Payer: Self-pay

## 2020-06-06 DIAGNOSIS — R1011 Right upper quadrant pain: Secondary | ICD-10-CM | POA: Diagnosis not present

## 2020-06-06 DIAGNOSIS — K760 Fatty (change of) liver, not elsewhere classified: Secondary | ICD-10-CM | POA: Insufficient documentation

## 2020-06-06 DIAGNOSIS — N200 Calculus of kidney: Secondary | ICD-10-CM | POA: Insufficient documentation

## 2020-06-06 DIAGNOSIS — K7689 Other specified diseases of liver: Secondary | ICD-10-CM | POA: Diagnosis not present

## 2020-06-06 MED ORDER — TAMSULOSIN HCL 0.4 MG PO CAPS: 0.4000 mg | ORAL_CAPSULE | Freq: Every day | ORAL | 0 refills | Status: DC

## 2020-06-06 NOTE — Progress Notes (Signed)
1. Let pt know from image results he has liver steatosis: the goal of treatment is  Establishing and maintaining abstinence from alcohol Stabilizing liver function with medication and above  Steatosis - For patients with alcohol-associated steatosis who have not progressed to cirrhosis, abstinence has been associated with rapid improvement in liver steatosis   I would like to check his immunity for hepatitis and if not immune vaccinate him against hepatitis A virus and hepatitis B virus.   He should be vaccinated for Pneumonia, Shingrex, COVID if not already, and annual flu.    2. The pt has a kidney stone approximately 12 mm in the upper pole of the right kidney. I will prescribe him flomax and refer to nephrologist.

## 2020-06-19 ENCOUNTER — Other Ambulatory Visit: Payer: Self-pay | Admitting: Family Medicine

## 2020-06-20 ENCOUNTER — Other Ambulatory Visit: Payer: Self-pay | Admitting: Family Medicine

## 2020-06-27 ENCOUNTER — Other Ambulatory Visit: Payer: Self-pay | Admitting: Family Medicine

## 2020-06-27 DIAGNOSIS — F411 Generalized anxiety disorder: Secondary | ICD-10-CM

## 2020-07-09 ENCOUNTER — Other Ambulatory Visit: Payer: Self-pay | Admitting: Nurse Practitioner

## 2020-07-09 DIAGNOSIS — N200 Calculus of kidney: Secondary | ICD-10-CM

## 2020-08-05 ENCOUNTER — Ambulatory Visit: Payer: Medicare HMO | Admitting: Nurse Practitioner

## 2020-08-05 ENCOUNTER — Telehealth: Payer: Self-pay | Admitting: Family Medicine

## 2020-08-05 NOTE — Telephone Encounter (Signed)
Patients wife left vm requesting refill on his tramodol  CB # [phone]

## 2020-08-06 ENCOUNTER — Other Ambulatory Visit: Payer: Self-pay

## 2020-08-06 ENCOUNTER — Other Ambulatory Visit: Payer: Self-pay | Admitting: Nurse Practitioner

## 2020-08-06 ENCOUNTER — Ambulatory Visit (INDEPENDENT_AMBULATORY_CARE_PROVIDER_SITE_OTHER): Payer: Medicare HMO | Admitting: Nurse Practitioner

## 2020-08-06 ENCOUNTER — Telehealth: Payer: Self-pay | Admitting: Family Medicine

## 2020-08-06 VITALS — BP 138/96 | HR 73 | Temp 97.3°F | Ht 70.0 in | Wt 218.0 lb

## 2020-08-06 DIAGNOSIS — A6920 Lyme disease, unspecified: Secondary | ICD-10-CM

## 2020-08-06 DIAGNOSIS — W57XXXA Bitten or stung by nonvenomous insect and other nonvenomous arthropods, initial encounter: Secondary | ICD-10-CM | POA: Diagnosis not present

## 2020-08-06 MED ORDER — DOXYCYCLINE HYCLATE 100 MG PO TABS: 100.0000 mg | ORAL_TABLET | Freq: Two times a day (BID) | ORAL | 0 refills | Status: DC

## 2020-08-06 MED ORDER — TRAMADOL HCL 50 MG PO TABS: 50.0000 mg | ORAL_TABLET | Freq: Four times a day (QID) | ORAL | 0 refills | Status: DC | PRN

## 2020-08-06 NOTE — Progress Notes (Signed)
Established Patient Office Visit  Subjective:  Patient ID: Curtis Lucas, male    DOB: [birth year]  Age: 59 y.o. MRN: [id number]  CC:  Chief Complaint  Patient presents with   possible lyme dis/ tick bite abd    fatigue, headaches / removed 5 days ago    HPI Curtis Lucas is a 59 year old male presenting to clinic with rash that has developed into a "bulls eye" rash that appeared two days ago after a tick bite, removing the tick about 5-7 days ago. No fever, chills, H/A, drainage from the site. No txs tried.   Past Medical History:  Diagnosis Date   Allergy    Rhinitis   Anxiety    Arthritis    Biceps rupture, distal, right, initial encounter    CVA (cerebral vascular accident) ([place])    Depression    Difficulty sleeping    Elevated lipids    GERD (gastroesophageal reflux disease)    Hyperglycemia    Hyperlipidemia    Hypertension    Insomnia     Past Surgical History:  Procedure Laterality Date   COLONOSCOPY  09/24/2003   patterson--normal per pt.   DISTAL BICEPS TENDON REPAIR Right 02/24/2017   Procedure: RIGHT DISTAL BICEPS TENDON REPAIR;  Surgeon: Leandrew Koyanagi, MD;  Location: [facility];  Service: Orthopedics;  Laterality: Right;   HEAD INJURY   1987   JOINT REPLACEMENT     LUMBAR EPIDURAL INJECTION     RECTAL SURGERY  2006   TOTAL HIP ARTHROPLASTY     TOTAL HIP ARTHROPLASTY  05/03/2012   Procedure: TOTAL HIP ARTHROPLASTY ANTERIOR APPROACH;  Surgeon: Mauri Pole, MD;  Location: [facility];  Service: Orthopedics;  Laterality: Right;    Family History  Problem Relation Age of Onset   Stroke Mother    Cancer Mother        colon   Dementia Mother    Colon cancer Mother    Parkinson's disease Father    Heart attack Paternal Grandfather    Dementia Paternal Grandmother    Colon polyps Neg Hx    Esophageal cancer Neg Hx    Rectal cancer Neg Hx    Stomach cancer Neg Hx     Social History   Socioeconomic  History   Marital status: Married    Spouse name: Debbie   Number of children: 0   Years of education: HS   Highest education level: Not on file  Occupational History    Employer: [employer]    Comment: Product manager  Tobacco Use   Smoking status: Never Smoker   Smokeless tobacco: Never Used  Substance and Sexual Activity   Alcohol use: Yes    Comment: 4-5 BEERS PER WK   Drug use: Yes    Types: Marijuana   Sexual activity: Yes  Other Topics Concern   Not on file  Social History Narrative   Patient lives at home with spouse.   Caffeine Use:4 16oz Mt. Dew sodas daily   Social Determinants of Health   Financial Resource Strain: Low Risk    Difficulty of Paying Living Expenses: Not very hard  Food Insecurity:    Worried About Charity fundraiser in the Last Year:    Arboriculturist in the Last Year:   Transportation Needs:    Film/video editor (Medical):    Lack of Transportation (Non-Medical):   Physical Activity:    Days of Exercise  per Week:    Minutes of Exercise per Session:   Stress:    Feeling of Stress :   Social Connections:    Frequency of Communication with Friends and Family:    Frequency of Social Gatherings with Friends and Family:    Attends Religious Services:    Active Member of Clubs or Organizations:    Attends Music therapist:    Marital Status:   Intimate Partner Violence:    Fear of Current or Ex-Partner:    Emotionally Abused:    Physically Abused:    Sexually Abused:     Outpatient Medications Prior to Visit  Medication Sig Dispense Refill   amLODipine (NORVASC) 10 MG tablet TAKE ONE TABLET BY MOUTH DAILY 90 tablet 2   aspirin 81 MG tablet Take 81 mg by mouth daily.     atenolol (TENORMIN) 100 MG tablet TAKE ONE TABLET BY MOUTH DAILY 90 tablet 0   escitalopram (LEXAPRO) 20 MG tablet TAKE ONE TABLET BY MOUTH DAILY 30 tablet 2   Omega-3 Fatty Acids (FISH OIL) 1200 MG CAPS  Take 1 capsule by mouth at bedtime.      pantoprazole (PROTONIX) 40 MG tablet TAKE ONE TABLET BY MOUTH DAILY 30 tablet 2   rosuvastatin (CRESTOR) 20 MG tablet TAKE ONE TABLET BY MOUTH DAILY *DISCONTINUE PRAVASTATIN* 90 tablet 0   tamsulosin (FLOMAX) 0.4 MG CAPS capsule TAKE ONE CAPSULE BY MOUTH DAILY 30 capsule 6   zolpidem (AMBIEN) 10 MG tablet TAKE ONE TABLET BY MOUTH EVERY NIGHT AT BEDTIME AS NEEDED FOR SLEEP 30 tablet 3   traMADol (ULTRAM) 50 MG tablet TAKE ONE TABLET BY MOUTH EVERY 6 HOURS AS NEEDED 30 tablet 3   No facility-administered medications prior to visit.    Allergies  Allergen Reactions   Lipitor [Atorvastatin Calcium]     Severe muscle spasms    ROS Review of Systems  All other systems reviewed and are negative.     Objective:    Physical Exam Vitals and nursing note reviewed.  Constitutional:      Appearance: Normal appearance.  HENT:     Head: Normocephalic.  Eyes:     Extraocular Movements: Extraocular movements intact.     Conjunctiva/sclera: Conjunctivae normal.     Pupils: Pupils are equal, round, and reactive to light.  Cardiovascular:     Rate and Rhythm: Normal rate.  Pulmonary:     Effort: Pulmonary effort is normal.  Musculoskeletal:     Cervical back: Normal range of motion and neck supple.  Skin:    General: Skin is warm.     Findings: Rash present.          Comments: Erythema migrans present to area drawn  Neurological:     General: No focal deficit present.     Mental Status: He is alert and oriented to person, place, and time.  Psychiatric:        Mood and Affect: Mood normal.        Thought Content: Thought content normal.        Judgment: Judgment normal.     BP (!) 138/96    Pulse 73    Temp (!) 97.3 F (36.3 C)    Ht 5\' 10"  (1.778 m)    Wt 218 lb (98.9 kg)    SpO2 95%    BMI 31.28 kg/m  Wt Readings from Last 3 Encounters:  08/06/20 218 lb (98.9 kg)  06/04/20 213 lb 3.2 oz (96.7 kg)  04/15/20 219 lb (99.3 kg)      Health Maintenance Due  Topic Date Due   INFLUENZA VACCINE  07/21/2020    There are no preventive care reminders to display for this patient.  Lab Results  Component Value Date   TSH 0.736 05/02/2015   Lab Results  Component Value Date   WBC 5.4 06/04/2020   HGB 14.8 06/04/2020   HCT 43.7 06/04/2020   MCV 93.4 06/04/2020   PLT 287 06/04/2020   Lab Results  Component Value Date   NA 143 06/04/2020   K 4.4 06/04/2020   CO2 27 06/04/2020   GLUCOSE 117 (H) 06/04/2020   BUN 13 06/04/2020   CREATININE 0.83 06/04/2020   BILITOT 0.5 06/04/2020   ALKPHOS 70 08/11/2017   AST 39 (H) 06/04/2020   ALT 55 (H) 06/04/2020   PROT 7.1 06/04/2020   ALBUMIN 4.5 08/11/2017   CALCIUM 9.5 06/04/2020   ANIONGAP 8 07/28/2019   Lab Results  Component Value Date   CHOL 164 06/04/2020   Lab Results  Component Value Date   HDL 50 06/04/2020   Lab Results  Component Value Date   LDLCALC 80 06/04/2020   Lab Results  Component Value Date   TRIG 246 (H) 06/04/2020   Lab Results  Component Value Date   CHOLHDL 3.3 06/04/2020   Lab Results  Component Value Date   HGBA1C 5.6 06/04/2020      Assessment & Plan:   Problem List Items Addressed This Visit    None    Visit Diagnoses    Tick bite, initial encounter    -  Primary   Relevant Medications   doxycycline (VIBRA-TABS) 100 MG tablet   Lyme disease with erythema migrans lesion 5 cm or greater in diameter       Relevant Medications   doxycycline (VIBRA-TABS) 100 MG tablet    Discussed treatment for Tick bites, Lyme disease, Educational print out provided.   Meds ordered this encounter  Medications   doxycycline (VIBRA-TABS) 100 MG tablet    Sig: Take 1 tablet (100 mg total) by mouth 2 (two) times daily.    Dispense:  42 tablet    Refill:  0    Follow-up: Return if symptoms worsen or fail to improve.    Annie Main, FNP

## 2020-08-06 NOTE — Telephone Encounter (Signed)
Refill tramadol

## 2020-08-06 NOTE — Telephone Encounter (Signed)
Requested Prescriptions   Pending Prescriptions Disp Refills   traMADol (ULTRAM) 50 MG tablet 30 tablet 3    Sig: Take 1 tablet (50 mg total) by mouth every 6 (six) hours as needed.     Last OV 06/06/2020   Last written 05/23/2020

## 2020-08-06 NOTE — Telephone Encounter (Signed)
It was filled and sent to pharmacy.

## 2020-08-30 ENCOUNTER — Other Ambulatory Visit: Payer: Self-pay | Admitting: Nurse Practitioner

## 2020-08-30 NOTE — Telephone Encounter (Signed)
Ok to refill??  Last office visit/ refill 08/06/2020.

## 2020-09-18 ENCOUNTER — Other Ambulatory Visit: Payer: Self-pay

## 2020-09-19 MED ORDER — ZOLPIDEM TARTRATE 10 MG PO TABS
ORAL_TABLET | ORAL | 3 refills | Status: DC
Start: 2020-09-19 — End: 2020-09-25

## 2020-09-19 MED ORDER — ZOLPIDEM TARTRATE 10 MG PO TABS
ORAL_TABLET | ORAL | 3 refills | Status: DC
Start: 2020-09-19 — End: 2020-09-19

## 2020-09-19 NOTE — Addendum Note (Signed)
Addended by: Sheral Flow on: 09/19/2020 09:20 AM   Modules accepted: Orders

## 2020-09-19 NOTE — Telephone Encounter (Signed)
Please re-submit as transmission to pharmacy failed.  

## 2020-09-20 ENCOUNTER — Other Ambulatory Visit: Payer: Self-pay | Admitting: Family Medicine

## 2020-09-20 NOTE — Telephone Encounter (Signed)
CB# [phone] Refill  Ambien

## 2020-09-20 NOTE — Telephone Encounter (Signed)
Medication has been refilled on 09/19/2020.

## 2020-09-25 ENCOUNTER — Other Ambulatory Visit: Payer: Self-pay

## 2020-09-26 MED ORDER — ZOLPIDEM TARTRATE 10 MG PO TABS
ORAL_TABLET | ORAL | 3 refills | Status: DC
Start: 2020-09-26 — End: 2021-02-04

## 2020-10-01 ENCOUNTER — Other Ambulatory Visit: Payer: Self-pay | Admitting: Family Medicine

## 2020-10-01 NOTE — Telephone Encounter (Signed)
Received fax from [facility] on [address] requesting a refill on rosuvastatin (CRESTOR) 20 MG tablet  Sig: TAKE ONE TABLET BY MOUTH DAILY *DISCONTINUE PRAVASTATIN QTY 90

## 2020-10-02 MED ORDER — ROSUVASTATIN CALCIUM 20 MG PO TABS: 20.0000 mg | ORAL_TABLET | Freq: Every day | ORAL | 0 refills | Status: DC

## 2020-10-04 ENCOUNTER — Other Ambulatory Visit: Payer: Self-pay | Admitting: Family Medicine

## 2020-10-04 NOTE — Telephone Encounter (Signed)
Patient needs refill on Tramadol HCL 50 mg at [facility]

## 2020-10-07 ENCOUNTER — Other Ambulatory Visit: Payer: Self-pay | Admitting: Family Medicine

## 2020-10-07 ENCOUNTER — Other Ambulatory Visit: Payer: Self-pay

## 2020-10-07 DIAGNOSIS — F411 Generalized anxiety disorder: Secondary | ICD-10-CM

## 2020-10-07 MED ORDER — ESCITALOPRAM OXALATE 20 MG PO TABS: 20.0000 mg | ORAL_TABLET | Freq: Every day | ORAL | 2 refills | Status: DC

## 2020-10-07 MED ORDER — ATENOLOL 100 MG PO TABS: 100.0000 mg | ORAL_TABLET | Freq: Every day | ORAL | 0 refills | Status: DC

## 2020-10-07 MED ORDER — PANTOPRAZOLE SODIUM 40 MG PO TBEC
40.0000 mg | DELAYED_RELEASE_TABLET | Freq: Every day | ORAL | 2 refills | Status: DC
Start: 2020-10-07 — End: 2020-10-23

## 2020-10-07 MED ORDER — TRAMADOL HCL 50 MG PO TABS
50.0000 mg | ORAL_TABLET | Freq: Four times a day (QID) | ORAL | 0 refills | Status: DC | PRN
Start: 2020-10-07 — End: 2020-11-05

## 2020-10-07 NOTE — Telephone Encounter (Signed)
Prescription sent to pharmacy.

## 2020-10-07 NOTE — Telephone Encounter (Signed)
Ok to refill??  Last office visit 08/06/2020.  Last refill 08/30/2020.

## 2020-10-07 NOTE — Telephone Encounter (Signed)
Pt is needing refill on escitalopram (LEXAPRO) 20 MG tablet pantoprazole (PROTONIX) 40 MG tablet [ Sent to [facility] [address], [place] - [address]   Pt call back [phone]

## 2020-10-07 NOTE — Telephone Encounter (Signed)
Requesting refill on tramadol sig: TAKE 1 TABLET BY MOUTH EVERY 6 HOURS AS NEEDED He uses [name] on [address].  CB# [phone]

## 2020-10-07 NOTE — Telephone Encounter (Signed)
Pt needing refill on atenolol (TENORMIN) 100 MG [facility]Harris [name] [address], [place] - [address]   Pt call back [phone]

## 2020-10-07 NOTE — Telephone Encounter (Signed)
Duplicate

## 2020-10-11 ENCOUNTER — Telehealth: Payer: Self-pay | Admitting: Pharmacist

## 2020-10-11 NOTE — Progress Notes (Signed)
Chronic Care Management Pharmacy Assistant   Name: Curtis Lucas  MRN: [id number] DOB: 10-May-1961  Reason for Encounter: Disease State  Patient Questions:  1.  Have you seen any other providers since your last visit? Yes  2.  Any changes in your medicines or health? No   PCP : Susy Frizzle, MD   Their chronic conditions include: hypertension, GERD, hyperlipidemia.  Office Visit: 08-06-2020: Patient presented in the office with NP Redmond Baseman c/o a tick bite. Possible lyme disease. Prescribed doxycycline (VIBRA-TABS) 100 MG tablet two times daily.  06-04-2020: Patient presented in the office with NP Redmond Baseman c/o upper right abdominal pain. The sxs has remained the same since onset not worsening. He has tried tylenol without relief. Pain described as dull achy.  He does report drinking 1 box of wine every other day along with several beers "no number". The pt admits to having dark colored urine and yellow to orange bowel movements over the past 1-2 weeks. Was advised to decrease to stop alcohol intake and drink plenty of water. CBC normal w/out signs of pancreatis.   Consults: 06-06-2020 (Radiology) Patient presented for an ultrasound of the RUQ abdomen. Results were liver steatosis. The pt also has a kidney stone approximately 12 mm in the upper pole of the right kidney. Was prescribed flomax 0.4 mg capsule and referred to nephrologist.  Allergies:   Allergies  Allergen Reactions   Lipitor [Atorvastatin Calcium]     Severe muscle spasms    Medications: Outpatient Encounter Medications as of 10/11/2020  Medication Sig   amLODipine (NORVASC) 10 MG tablet TAKE ONE TABLET BY MOUTH DAILY   aspirin 81 MG tablet Take 81 mg by mouth daily.   atenolol (TENORMIN) 100 MG tablet Take 1 tablet (100 mg total) by mouth daily.   doxycycline (VIBRA-TABS) 100 MG tablet Take 1 tablet (100 mg total) by mouth 2 (two) times daily.   escitalopram (LEXAPRO) 20 MG tablet Take 1 tablet (20 mg  total) by mouth daily.   Omega-3 Fatty Acids (FISH OIL) 1200 MG CAPS Take 1 capsule by mouth at bedtime.    pantoprazole (PROTONIX) 40 MG tablet Take 1 tablet (40 mg total) by mouth daily.   rosuvastatin (CRESTOR) 20 MG tablet Take 1 tablet (20 mg total) by mouth daily.   tamsulosin (FLOMAX) 0.4 MG CAPS capsule TAKE ONE CAPSULE BY MOUTH DAILY   traMADol (ULTRAM) 50 MG tablet Take 1 tablet (50 mg total) by mouth every 6 (six) hours as needed.   zolpidem (AMBIEN) 10 MG tablet TAKE ONE TABLET BY MOUTH EVERY NIGHT AT BEDTIME AS NEEDED FOR SLEEP   No facility-administered encounter medications on file as of 10/11/2020.    Current Diagnosis: Patient Active Problem List   Diagnosis Date Noted   Steatosis of liver 06/06/2020   Renal calculus, right 06/06/2020   Mixed hyperlipidemia 08/31/2019   Olecranon bursitis of left elbow 07/22/2017   Impingement syndrome of right shoulder 07/08/2017   Rupture of right distal biceps tendon 02/11/2017   Generalized anxiety disorder 01/20/2017   Prostate cancer screening 01/20/2017   Vitamin D deficiency 05/06/2015   Visual disturbance 05/02/2015   Insomnia    S/P right THA, AA 05/03/2012   Hypertension    GERD (gastroesophageal reflux disease)    CVA (cerebral vascular accident) ([place])    Allergy    Elevated lipids    Hyperglycemia     Goals Addressed   None    Reviewed chart prior to disease state  call. Spoke with patient regarding BP  Recent Office Vitals: BP Readings from Last 3 Encounters:  08/06/20 (!) 138/96  06/04/20 (!) 140/92  04/15/20 132/74   Pulse Readings from Last 3 Encounters:  08/06/20 73  06/04/20 89  04/15/20 70    Wt Readings from Last 3 Encounters:  08/06/20 218 lb (98.9 kg)  06/04/20 213 lb 3.2 oz (96.7 kg)  04/15/20 219 lb (99.3 kg)     Kidney Function Lab Results  Component Value Date/Time   CREATININE 0.83 06/04/2020 10:16 AM   CREATININE 0.81 11/28/2019 12:43 PM   GFRNONAA 96  06/04/2020 10:16 AM   GFRAA 111 06/04/2020 10:16 AM    BMP Latest Ref Rng & Units 06/04/2020 11/28/2019 09/27/2019  Glucose 65 - 99 mg/dL 117(H) 118(H) 107(H)  BUN 7 - 25 mg/dL 13 14 14   Creatinine 0.70 - 1.25 mg/dL 0.83 0.81 0.90  BUN/Creat Ratio 6 - 22 (calc) NOT APPLICABLE NOT APPLICABLE 16  Sodium 938 - 146 mmol/L 143 142 138  Potassium 3.5 - 5.3 mmol/L 4.4 5.1 4.7  Chloride 98 - 110 mmol/L 104 105 102  CO2 20 - 32 mmol/L 27 28 23   Calcium 8.6 - 10.3 mg/dL 9.5 9.4 9.2     Current antihypertensive regimen:  o Amlodipine 10 mg daily, o Atenolol 100 mg daily   How often are you checking your Blood Pressure? infrequently     Current home BP readings: Patient states he does not have a blood pressure machine at home and only checks his BP when he is out.     What recent interventions/DTPs have been made by any provider to improve Blood Pressure control since last CPP Visit: none    Any recent hospitalizations or ED visits since last visit with CPP? No     What diet changes have been made to improve Blood Pressure Control?  o None at this time   What exercise is being done to improve your Blood Pressure Control?  o None at this time  Adherence Review: Is the patient currently on ACE/ARB medication? No Does the patient have >5 day gap between last estimated fill dates? No CPP please confirm  Inquired with the patient if he has been decreasing his alcohol intake and increasing water. Patient stated he does not like to drink water, but likes V8. Did not comment on alcohol intake.  Follow-Up:  Pharmacist Review   Fanny Skates, [place] Pharmacist Assistant [phone]

## 2020-10-18 ENCOUNTER — Telehealth: Payer: Self-pay | Admitting: Pharmacist

## 2020-10-18 NOTE — Progress Notes (Signed)
Verified Adherence Gap Information. Per insurance data, the patient does not have a  Medicare AWV on file. Their last A1C was 5.6% recorded on 06-04-2020. Patient is compliant with colorectal screening.  Fanny Skates, [place] Pharmacist Assistant [phone]

## 2020-10-23 ENCOUNTER — Other Ambulatory Visit: Payer: Self-pay

## 2020-10-23 MED ORDER — PANTOPRAZOLE SODIUM 40 MG PO TBEC
40.0000 mg | DELAYED_RELEASE_TABLET | Freq: Every day | ORAL | 2 refills | Status: DC
Start: 2020-10-23 — End: 2022-01-21

## 2020-11-05 ENCOUNTER — Other Ambulatory Visit: Payer: Self-pay

## 2020-11-05 MED ORDER — TRAMADOL HCL 50 MG PO TABS: 50.0000 mg | ORAL_TABLET | Freq: Four times a day (QID) | ORAL | 0 refills | Status: DC | PRN

## 2020-11-11 ENCOUNTER — Other Ambulatory Visit: Payer: Self-pay

## 2020-11-11 MED ORDER — TRAMADOL HCL 50 MG PO TABS: 50.0000 mg | ORAL_TABLET | Freq: Four times a day (QID) | ORAL | 0 refills | Status: DC | PRN

## 2020-11-12 ENCOUNTER — Ambulatory Visit: Payer: Self-pay

## 2020-11-12 DIAGNOSIS — E782 Mixed hyperlipidemia: Secondary | ICD-10-CM

## 2020-11-12 DIAGNOSIS — I10 Essential (primary) hypertension: Secondary | ICD-10-CM

## 2020-11-12 NOTE — Patient Instructions (Addendum)
Visit Information  Goals Addressed            This Visit's Progress   . Pharmacy Care Plan:   Not on track    CARE PLAN ENTRY  Current Barriers:  . Chronic Disease Management support, education, and care coordination needs related to Hypertension and Hyperlipidemia   Hypertension . Pharmacist Clinical Goal(s): o Over the next 180 days, patient will work with PharmD and providers to maintain BP goal <140/90 . Current regimen:  o Amlodipine 10mg  daily, atenolol 100mg  daily . Interventions: o Comprehensive medication review o Continue current therapy . Patient self care activities - Over the next 180 days, patient will: o Check BP as needed, document, and provide at future appointments o Ensure daily salt intake < 2300 mg/day o Contact PharmD or PCP with blood pressure readings > 140/90  Hyperlipidemia . Pharmacist Clinical Goal(s): o Over the next 180 days, patient will work with PharmD and providers to maintain LDL goal < 100 and work to lower triglycerides. . Current regimen:  o Rosuvastatin 20mg  daily . Interventions: o Comprehensive medication review o Continue current therapy o Reviewed most recent lipid panel and goals of therapy . Patient self care activities - Over the next 180 days, patient will: o Continue to take medication as directed o Limit servings of fried/fatty foods to 2 to 3 times per week to lower triglycerides o Contact PharmD or PCP with any medication related concerns. o Work to limit the amount of alcohol consumption   Please see past updates related to this goal by clicking on the "Past Updates" button in the selected goal         The patient verbalized understanding of instructions, educational materials, and care plan provided today and agreed to receive a mailed copy of patient instructions, educational materials, and care plan.   Telephone follow up appointment with pharmacy team member scheduled for: 6 months  Beverly Milch,  PharmD Clinical Pharmacist [facility] [phone]   Dyslipidemia Dyslipidemia is an imbalance of waxy, fat-like substances (lipids) in the blood. The body needs lipids in small amounts. Dyslipidemia often involves a high level of cholesterol or triglycerides, which are types of lipids. Common forms of dyslipidemia include:  High levels of LDL cholesterol. LDL is the type of cholesterol that causes fatty deposits (plaques) to build up in the blood vessels that carry blood away from your heart (arteries).  Low levels of HDL cholesterol. HDL cholesterol is the type of cholesterol that protects against heart disease. High levels of HDL remove the LDL buildup from arteries.  High levels of triglycerides. Triglycerides are a fatty substance in the blood that is linked to a buildup of plaques in the arteries. What are the causes? Primary dyslipidemia is caused by changes (mutations) in genes that are passed down through families (inherited). These mutations cause several types of dyslipidemia. Secondary dyslipidemia is caused by lifestyle choices and diseases that lead to dyslipidemia, such as:  Eating a diet that is high in animal fat.  Not getting enough exercise.  Having diabetes, kidney disease, liver disease, or thyroid disease.  Drinking large amounts of alcohol.  Using certain medicines. What increases the risk? You are more likely to develop this condition if you are an older man or if you are a woman who has gone through menopause. Other risk factors include:  Having a family history of dyslipidemia.  Taking certain medicines, including birth control pills, steroids, some diuretics, and beta-blockers.  Smoking cigarettes.  Eating a high-fat diet.  Having certain medical conditions such as diabetes, polycystic ovary syndrome (PCOS), kidney disease, liver disease, or hypothyroidism.  Not exercising regularly.  Being overweight or obese with too much  belly fat. What are the signs or symptoms? In most cases, dyslipidemia does not usually cause any symptoms. In severe cases, very high lipid levels can cause:  Fatty bumps under the skin (xanthomas).  White or gray ring around the black center (pupil) of the eye. Very high triglyceride levels can cause inflammation of the pancreas (pancreatitis). How is this diagnosed? Your health care provider may diagnose dyslipidemia based on a routine blood test (fasting blood test). Because most people do not have symptoms of the condition, this blood testing (lipid profile) is done on adults age 40 and older and is repeated every 5 years. This test checks:  Total cholesterol. This measures the total amount of cholesterol in your blood, including LDL cholesterol, HDL cholesterol, and triglycerides. A healthy number is below 200.  LDL cholesterol. The target number for LDL cholesterol is different for each person, depending on individual risk factors. Ask your health care provider what your LDL cholesterol should be.  HDL cholesterol. An HDL level of 60 or higher is best because it helps to protect against heart disease. A number below 66 for men or below 69 for women increases the risk for heart disease.  Triglycerides. A healthy triglyceride number is below 150. If your lipid profile is abnormal, your health care provider may do other blood tests. How is this treated? Treatment depends on the type of dyslipidemia that you have and your other risk factors for heart disease and stroke. Your health care provider will have a target range for your lipid levels based on this information. For many people, this condition may be treated by lifestyle changes, such as diet and exercise. Your health care provider may recommend that you:  Get regular exercise.  Make changes to your diet.  Quit smoking if you smoke. If diet changes and exercise do not help you reach your goals, your health care provider may also  prescribe medicine to lower lipids. The most commonly prescribed type of medicine lowers your LDL cholesterol (statin drug). If you have a high triglyceride level, your provider may prescribe another type of drug (fibrate) or an omega-3 fish oil supplement, or both. Follow these instructions at home:  Eating and drinking  Follow instructions from your health care provider or dietitian about eating or drinking restrictions.  Eat a healthy diet as told by your health care provider. This can help you reach and maintain a healthy weight, lower your LDL cholesterol, and raise your HDL cholesterol. This may include: ? Limiting your calories, if you are overweight. ? Eating more fruits, vegetables, whole grains, fish, and lean meats. ? Limiting saturated fat, trans fat, and cholesterol.  If you drink alcohol: ? Limit how much you use. ? Be aware of how much alcohol is in your drink. In the [place] one drink equals one 12 oz bottle of beer (355 mL), one 5 oz glass of wine (148 mL), or one 1 oz glass of hard liquor (44 mL).  Do not drink alcohol if: ? Your health care provider tells you not to drink. ? You are pregnant, may be pregnant, or are planning to become pregnant. Activity  Get regular exercise. Start an exercise and strength training program as told by your health care provider. Ask your health care provider what activities are safe  for you. Your health care provider may recommend: ? 30 minutes of aerobic activity 4-6 days a week. Brisk walking is an example of aerobic activity. ? Strength training 2 days a week. General instructions  Do not use any products that contain nicotine or tobacco, such as cigarettes, e-cigarettes, and chewing tobacco. If you need help quitting, ask your health care provider.  Take over-the-counter and prescription medicines only as told by your health care provider. This includes supplements.  Keep all follow-up visits as told by your health care  provider. Contact a health care provider if:  You are: ? Having trouble sticking to your exercise or diet plan. ? Struggling to quit smoking or control your use of alcohol. Summary  Dyslipidemia often involves a high level of cholesterol or triglycerides, which are types of lipids.  Treatment depends on the type of dyslipidemia that you have and your other risk factors for heart disease and stroke.  For many people, treatment starts with lifestyle changes, such as diet and exercise.  Your health care provider may prescribe medicine to lower lipids. This information is not intended to replace advice given to you by your health care provider. Make sure you discuss any questions you have with your health care provider. Document Revised: 08/01/2018 Document Reviewed: 07/08/2018 Elsevier Patient Education  [place].

## 2020-11-12 NOTE — Chronic Care Management (AMB) (Addendum)
Chronic Care Management Pharmacy  Name: Curtis Lucas  MRN: [id number] DOB: [birth year]   Chief Complaint/ HPI  Curtis Lucas,  59 y.o. , male presents for their Follow-Up CCM visit with the clinical pharmacist via telephone.  PCP : Susy Frizzle, MD  Their chronic conditions include: hypertension, GERD, hyperlipidemia.   Office Visits: 08/06/2020 Redmond Baseman) - tick bite that has developed into a bulls eye rash. Given another Rx of doxycycline 21 day course.  04/15/2020 ([place]) - had lyme disease from tick bite, given doxycycline 100mg  twice daily for 21 days  11/28/2019 ([facility]) - goal LDL < 100, recommended he could d/c ASA since no CAD, no other med changes noted  Consult Visit:  [id number] Roxanne Mins, ED) - came in for chest pain, normal ECG, normal chest x -ray, discharged home no changes  Medications: Outpatient Encounter Medications as of 11/12/2020  Medication Sig   amLODipine (NORVASC) 10 MG tablet TAKE ONE TABLET BY MOUTH DAILY   aspirin 81 MG tablet Take 81 mg by mouth daily.   atenolol (TENORMIN) 100 MG tablet Take 1 tablet (100 mg total) by mouth daily.   doxycycline (VIBRA-TABS) 100 MG tablet Take 1 tablet (100 mg total) by mouth 2 (two) times daily.   escitalopram (LEXAPRO) 20 MG tablet Take 1 tablet (20 mg total) by mouth daily.   Omega-3 Fatty Acids (FISH OIL) 1200 MG CAPS Take 1 capsule by mouth at bedtime.    pantoprazole (PROTONIX) 40 MG tablet Take 1 tablet (40 mg total) by mouth daily.   rosuvastatin (CRESTOR) 20 MG tablet Take 1 tablet (20 mg total) by mouth daily.   tamsulosin (FLOMAX) 0.4 MG CAPS capsule TAKE ONE CAPSULE BY MOUTH DAILY   traMADol (ULTRAM) 50 MG tablet Take 1 tablet (50 mg total) by mouth every 6 (six) hours as needed.   zolpidem (AMBIEN) 10 MG tablet TAKE ONE TABLET BY MOUTH EVERY NIGHT AT BEDTIME AS NEEDED FOR SLEEP   No facility-administered encounter medications on file as of 11/12/2020.    Current Diagnosis/Assessment:    [employer]: Low Risk    Difficulty of Paying Living Expenses: Not very hard    Goals Addressed             This Visit's Progress    Pharmacy Care Plan:   Not on track    CARE PLAN ENTRY  Current Barriers:  Chronic Disease Management support, education, and care coordination needs related to Hypertension and Hyperlipidemia   Hypertension Pharmacist Clinical Goal(s): Over the next 180 days, patient will work with PharmD and providers to maintain BP goal <140/90 Current regimen:  Amlodipine 10mg  daily, atenolol 100mg  daily Interventions: Comprehensive medication review Continue current therapy Patient self care activities - Over the next 180 days, patient will: Check BP as needed, document, and provide at future appointments Ensure daily salt intake < 2300 mg/day Contact PharmD or PCP with blood pressure readings > 140/90  Hyperlipidemia Pharmacist Clinical Goal(s): Over the next 180 days, patient will work with PharmD and providers to maintain LDL goal < 100 and work to lower triglycerides. Current regimen:  Rosuvastatin 20mg  daily Interventions: Comprehensive medication review Continue current therapy Reviewed most recent lipid panel and goals of therapy Patient self care activities - Over the next 180 days, patient will: Continue to take medication as directed Limit servings of fried/fatty foods to 2 to 3 times per week to lower triglycerides Contact PharmD or PCP with any medication related concerns. Work to limit the amount  of alcohol consumption   Please see past updates related to this goal by clicking on the "Past Updates" button in the selected goal          Hypertension    Office blood pressures are  BP Readings from Last 3 Encounters:  08/06/20 (!) 138/96  06/04/20 (!) 140/92  04/15/20 132/74    Patient has failed these meds in the past: none noted  Patient checks BP at home infrequently  Patient home BP readings are ranging:  he checks when out at the supermarket and reports "normal"  Patient is currently controlled on the following medications:  amlodipine 10mg  daily atenolol 100mg  daily  We discussed Still no symptoms noted, he denies dizziness, headaches Counseled to report if BP ever > 140/90 when he check Reports adherence with medication  Plan  Continue current medications     Hyperlipidemia   Lipid Panel     Component Value Date/Time   CHOL 164 06/04/2020 1016   TRIG 246 (H) 06/04/2020 1016   HDL 50 06/04/2020 1016   CHOLHDL 3.3 06/04/2020 1016   VLDL 10 08/11/2017 1135   [place] 80 06/04/2020 1016     Patient has failed these meds in past: none noted Patient is currently uncontrolled on the following medications:  Rosuvastatin 20mg  daily  TG continue to be elevated, suspect this is from heavy alcohol usage.  Counseled patient on also cutting back on fried and fatty foods. Patient still contemplating making lifestyle changes but he is "thinking about it", will continue to educate at further visits  Plan  Continue current medications.   Focus on servings of fatty/fried foods, try to limit alcohol consumption  GERD    Patient has failed these meds in past: none noted Patient is currently controlled on the following medications: pantoprazole 40mg    Plan  Continue current medications   Vaccines   Reviewed and discussed patient's vaccination history.    Immunization History  Administered Date(s) Administered   PFIZER SARS-COV-2 Vaccination 03/18/2020, 04/09/2020   Td 07/22/2003   Tdap 04/20/2014    Plan  Recommended patient receive shingles vaccine in office/pharmacy.   Medication Management   Miscellaneous medications: zolpidem 10mg  tablets, dicyclomine 10mg  capsule, escitalopram 20mg  tablets OTC's: Fish Oil 1200mg , ASA 81mg  Patient currently uses [facility].  Phone #  [phone] Patient reports using no specific method to organize medications and  promote adherence. Patient denies missed doses of medication.    Beverly Milch, PharmD Clinical Pharmacist [place] [phone]  I have collaborated with the care management provider regarding care management and care coordination activities outlined in this encounter and have reviewed this encounter including documentation in the note and care plan. I am certifying that I agree with the content of this note and encounter as supervising physician.

## 2020-12-26 ENCOUNTER — Telehealth: Payer: Self-pay | Admitting: Pharmacist

## 2020-12-26 NOTE — Progress Notes (Addendum)
Chronic Care Management Pharmacy Assistant   Name: Curtis Lucas  MRN: [id number] DOB: May 18, 1961  Reason for Encounter: Disease State For CHL  Patient Questions:  1.  Have you seen any other providers since your last visit? No.   2.  Any changes in your medicines or health? No.    PCP : Curtis Brooks, MD   Their chronic conditions include: hypertension, GERD, hyperlipidemia.  Office Visits: None since 11/12/20  Consults: None since 11/12/20  Allergies:   Allergies  Allergen Reactions   Lipitor [Atorvastatin Calcium]     Severe muscle spasms    Medications: Outpatient Encounter Medications as of 12/26/2020  Medication Sig   amLODipine (NORVASC) 10 MG tablet TAKE ONE TABLET BY MOUTH DAILY   aspirin 81 MG tablet Take 81 mg by mouth daily.   atenolol (TENORMIN) 100 MG tablet Take 1 tablet (100 mg total) by mouth daily.   doxycycline (VIBRA-TABS) 100 MG tablet Take 1 tablet (100 mg total) by mouth 2 (two) times daily.   escitalopram (LEXAPRO) 20 MG tablet Take 1 tablet (20 mg total) by mouth daily.   Omega-3 Fatty Acids (FISH OIL) 1200 MG CAPS Take 1 capsule by mouth at bedtime.    pantoprazole (PROTONIX) 40 MG tablet Take 1 tablet (40 mg total) by mouth daily.   rosuvastatin (CRESTOR) 20 MG tablet Take 1 tablet (20 mg total) by mouth daily.   tamsulosin (FLOMAX) 0.4 MG CAPS capsule TAKE ONE CAPSULE BY MOUTH DAILY   traMADol (ULTRAM) 50 MG tablet Take 1 tablet (50 mg total) by mouth every 6 (six) hours as needed.   zolpidem (AMBIEN) 10 MG tablet TAKE ONE TABLET BY MOUTH EVERY NIGHT AT BEDTIME AS NEEDED FOR SLEEP   No facility-administered encounter medications on file as of 12/26/2020.    Current Diagnosis: Patient Active Problem List   Diagnosis Date Noted   Steatosis of liver 06/06/2020   Renal calculus, right 06/06/2020   Mixed hyperlipidemia 08/31/2019   Olecranon bursitis of left elbow 07/22/2017   Impingement syndrome of right shoulder 07/08/2017    Rupture of right distal biceps tendon 02/11/2017   Generalized anxiety disorder 01/20/2017   Prostate cancer screening 01/20/2017   Vitamin D deficiency 05/06/2015   Visual disturbance 05/02/2015   Insomnia    S/P right THA, AA 05/03/2012   Hypertension    GERD (gastroesophageal reflux disease)    CVA (cerebral vascular accident) (HCC)    Allergy    Elevated lipids    Hyperglycemia     Goals Addressed   None    12/26/2020 Name: Curtis Lucas MRN: [id number] DOB: October 16, 1961 Curtis Lucas is a 60 y.o. year old male who is a primary care patient of Curtis Brooks, MD.  Comprehensive medication review performed; Spoke to patient regarding cholesterol  Lipid Panel    Component Value Date/Time   CHOL 164 06/04/2020 1016   TRIG 246 (H) 06/04/2020 1016   HDL 50 06/04/2020 1016   LDLCALC 80 06/04/2020 1016    10-year ASCVD risk score: The ASCVD Risk score Denman George DC Jr., et al., 2013) failed to calculate for the following reasons:   The patient has a prior MI or stroke diagnosis  Current antihyperlipidemic regimen:  Rosuvastatin 20 mg daily  Previous antihyperlipidemic medications tried: None noted.  ASCVD risk enhancing conditions: HTN   What recent interventions/DTPs have been made by any provider to improve Cholesterol control since last CPP Visit: None.   Any recent hospitalizations or  ED visits since last visit with CPP? No.  What diet changes have been made to improve Cholesterol?  Patent stated he current diet has not changed.   What exercise is being done to improve Cholesterol?  Patient stated he doesn't do much around the house and doesn't work anymore.   Adherence Review: Does the patient have >5 day gap between last estimated fill dates? No, CPP Please Check.  Patient stated he's not having problems getting his medication at this time. Patient stated his alcohol intake is the same.   Follow-Up:  Pharmacist Review   [name], RMA Clinical  Pharmacist Assistant [phone]  5 minutes spent in review, coordination, and documentation.  Reviewed by: Beverly Milch, PharmD Clinical Pharmacist [facility] [phone]

## 2020-12-27 ENCOUNTER — Telehealth: Payer: Self-pay | Admitting: Family Medicine

## 2020-12-27 MED ORDER — ATENOLOL 100 MG PO TABS
100.0000 mg | ORAL_TABLET | Freq: Every day | ORAL | 0 refills | Status: DC
Start: 2020-12-27 — End: 2021-01-08

## 2020-12-27 NOTE — Telephone Encounter (Signed)
Prescription sent to pharmacy.

## 2020-12-27 NOTE — Telephone Encounter (Signed)
Curtis Lucas requesting a refill on atenolol (TENORMIN) 100 MG tablet

## 2021-01-08 ENCOUNTER — Other Ambulatory Visit: Payer: Self-pay | Admitting: Family Medicine

## 2021-01-08 MED ORDER — ATENOLOL 100 MG PO TABS
100.0000 mg | ORAL_TABLET | Freq: Every day | ORAL | 0 refills | Status: DC
Start: 2021-01-08 — End: 2021-05-29

## 2021-01-08 NOTE — Telephone Encounter (Signed)
Ok to refill??  Last office visit 08/06/2020.  Last refill 11/11/2020.

## 2021-01-22 ENCOUNTER — Other Ambulatory Visit: Payer: Self-pay | Admitting: Family Medicine

## 2021-02-04 ENCOUNTER — Other Ambulatory Visit: Payer: Self-pay | Admitting: Family Medicine

## 2021-02-04 MED ORDER — ZOLPIDEM TARTRATE 10 MG PO TABS
ORAL_TABLET | ORAL | 3 refills | Status: DC
Start: 2021-02-04 — End: 2021-07-03

## 2021-02-04 NOTE — Telephone Encounter (Signed)
Pt called stating that his prescription of  zolpidem (AMBIEN) 10 MG tablet  Has not been sent to pharmacy [address]  Cb# [phone]

## 2021-02-04 NOTE — Telephone Encounter (Signed)
Ok to refill??  Last office visit 04/15/2020.  Last refill 09/26/2020, #3 refills.

## 2021-02-06 ENCOUNTER — Telehealth: Payer: Self-pay | Admitting: Family Medicine

## 2021-02-06 MED ORDER — AMLODIPINE BESYLATE 10 MG PO TABS
10.0000 mg | ORAL_TABLET | Freq: Every day | ORAL | 2 refills | Status: DC
Start: 2021-02-06 — End: 2021-11-12

## 2021-02-06 NOTE — Telephone Encounter (Signed)
Prescription sent to pharmacy.

## 2021-02-06 NOTE — Telephone Encounter (Signed)
Refill Norva Tigard

## 2021-03-10 ENCOUNTER — Other Ambulatory Visit: Payer: Self-pay | Admitting: Family Medicine

## 2021-03-10 MED ORDER — TRAMADOL HCL 50 MG PO TABS: 50.0000 mg | ORAL_TABLET | Freq: Four times a day (QID) | ORAL | 0 refills | Status: DC | PRN

## 2021-03-10 NOTE — Telephone Encounter (Signed)
Ok to refill??  Last office visit 04/15/2020.  Last refill 01/09/2021.

## 2021-03-10 NOTE — Telephone Encounter (Signed)
Pt wife called stating pt needed a refill of  traMADol (ULTRAM) 50 MG table  Cb#:[phone]

## 2021-04-09 ENCOUNTER — Other Ambulatory Visit: Payer: Self-pay | Admitting: Family Medicine

## 2021-04-09 NOTE — Telephone Encounter (Signed)
Received fax from [facility] requesting refill on traMADol (ULTRAM) 50 MG tablet

## 2021-04-11 MED ORDER — TRAMADOL HCL 50 MG PO TABS: 50.0000 mg | ORAL_TABLET | Freq: Four times a day (QID) | ORAL | 0 refills | Status: DC | PRN

## 2021-04-21 ENCOUNTER — Encounter: Payer: Self-pay | Admitting: Family Medicine

## 2021-04-21 ENCOUNTER — Ambulatory Visit (INDEPENDENT_AMBULATORY_CARE_PROVIDER_SITE_OTHER): Payer: Medicare HMO | Admitting: Family Medicine

## 2021-04-21 ENCOUNTER — Other Ambulatory Visit: Payer: Self-pay

## 2021-04-21 VITALS — BP 140/90 | HR 64 | Temp 98.8°F | Ht 70.0 in | Wt 216.6 lb

## 2021-04-21 DIAGNOSIS — I10 Essential (primary) hypertension: Secondary | ICD-10-CM | POA: Diagnosis not present

## 2021-04-21 DIAGNOSIS — Z136 Encounter for screening for cardiovascular disorders: Secondary | ICD-10-CM | POA: Diagnosis not present

## 2021-04-21 DIAGNOSIS — Z13220 Encounter for screening for lipoid disorders: Secondary | ICD-10-CM

## 2021-04-21 DIAGNOSIS — R7309 Other abnormal glucose: Secondary | ICD-10-CM | POA: Diagnosis not present

## 2021-04-21 DIAGNOSIS — E782 Mixed hyperlipidemia: Secondary | ICD-10-CM | POA: Diagnosis not present

## 2021-04-21 NOTE — Progress Notes (Signed)
Subjective:    Patient ID: Curtis Lucas, male    DOB: June 23, 1961, 60 y.o.   MRN: [id number]  Medication Refill   Patient is here today for a follow-up of his chronic medical problems.  Patient has remote history of a stroke.  This was due to a traumatic accident.  Patient had a metal rod pierced his left orbit, and impinge upon the left internal carotid artery.  This required emergency surgery.  This was performed at [facility].  He also has a history of a ruptured right biceps requiring surgical correction.  However the patient suffers chronic pain in his right antecubital fossa ever since.  He has to take tramadol 1-2 times a day due to the persistent pain in his right elbow and right bicep.  He has diminished strength in his arm and is unable to use it due to easy fatigability.  He also uses Ambien at night to help him sleep.  He has a history of hypertension however his blood pressure today is well controlled at 140/90.  Patient was referred to cardiology who performed a coronary artery CT scan which showed no blockages or evidence of coronary artery disease.  He is also on Crestor for hyperlipidemia.  He denies any myalgias right upper quadrant pain.  He is due today for lab work.  PSA is not due until June.  Last colonoscopy showed tubular adenomas but this was in 2020.  They recommended a repeat colonoscopy in 2023. Past Medical History:  Diagnosis Date  . Allergy    Rhinitis  . Anxiety   . Arthritis   . Biceps rupture, distal, right, initial encounter   . CVA (cerebral vascular accident) ([place])   . Depression   . Difficulty sleeping   . Elevated lipids   . GERD (gastroesophageal reflux disease)   . Hyperglycemia   . Hyperlipidemia   . Hypertension   . Insomnia    Past Surgical History:  Procedure Laterality Date  . COLONOSCOPY  09/24/2003   [facility]--normal per pt.  [name] DISTAL BICEPS TENDON REPAIR Right 02/24/2017   Procedure: RIGHT DISTAL BICEPS TENDON REPAIR;  Surgeon:  Leandrew Koyanagi, MD;  Location: [facility];  Service: Orthopedics;  Laterality: Right;  . HEAD INJURY   1987  . JOINT REPLACEMENT    . LUMBAR EPIDURAL INJECTION    . RECTAL SURGERY  2006  . TOTAL HIP ARTHROPLASTY    . TOTAL HIP ARTHROPLASTY  05/03/2012   Procedure: TOTAL HIP ARTHROPLASTY ANTERIOR APPROACH;  Surgeon: Mauri Pole, MD;  Location: [facility];  Service: Orthopedics;  Laterality: Right;   Current Outpatient Medications on File Prior to Visit  Medication Sig Dispense Refill  . amLODipine (NORVASC) 10 MG tablet Take 1 tablet (10 mg total) by mouth daily. 90 tablet 2  . aspirin 81 MG tablet Take 81 mg by mouth daily.    [name] atenolol (TENORMIN) 100 MG tablet Take 1 tablet (100 mg total) by mouth daily. 90 tablet 0  . doxycycline (VIBRA-TABS) 100 MG tablet Take 1 tablet (100 mg total) by mouth 2 (two) times daily. 42 tablet 0  . escitalopram (LEXAPRO) 20 MG tablet Take 1 tablet (20 mg total) by mouth daily. 90 tablet 2  . Omega-3 Fatty Acids (FISH OIL) 1200 MG CAPS Take 1 capsule by mouth at bedtime.     . pantoprazole (PROTONIX) 40 MG tablet Take 1 tablet (40 mg total) by mouth daily. 90 tablet 2  . rosuvastatin (CRESTOR) 20  MG tablet TAKE ONE TABLET BY MOUTH DAILY 90 tablet 0  . tamsulosin (FLOMAX) 0.4 MG CAPS capsule TAKE ONE CAPSULE BY MOUTH DAILY 30 capsule 6  . traMADol (ULTRAM) 50 MG tablet Take 1 tablet (50 mg total) by mouth every 6 (six) hours as needed. 30 tablet 0  . zolpidem (AMBIEN) 10 MG tablet TAKE ONE TABLET BY MOUTH EVERY NIGHT AT BEDTIME AS NEEDED FOR SLEEP 30 tablet 3   No current facility-administered medications on file prior to visit.   Allergies  Allergen Reactions  . Lipitor [Atorvastatin Calcium]     Severe muscle spasms   Social History   Socioeconomic History  . Marital status: Married    Spouse name: Jackelyn Poling  . Number of children: 0  . Years of education: HS  . Highest education level: Not on file  Occupational History    Employer: [employer]    Comment: [employer]  Tobacco Use  . Smoking status: Never Smoker  . Smokeless tobacco: Never Used  Substance and Sexual Activity  . Alcohol use: Yes    Comment: 4-5 BEERS PER WK  . Drug use: Yes    Types: Marijuana  . Sexual activity: Yes  Other Topics Concern  . Not on file  Social History Narrative   Patient lives at home with spouse.   Caffeine Use:4 16oz Mt. Dew sodas daily   Social Determinants of Health   Financial Resource Strain: Low Risk   . Difficulty of Paying Living Expenses: Not very hard  Food Insecurity: Not on file  Transportation Needs: Not on file  Physical Activity: Not on file  Stress: Not on file  Social Connections: Not on file  Intimate Partner Violence: Not on file      Review of Systems  All other systems reviewed and are negative.      Objective:   Physical Exam Vitals reviewed.  Constitutional:      General: He is not in acute distress.    Appearance: Normal appearance. He is normal weight. He is not ill-appearing or toxic-appearing.  HENT:     Head: Normocephalic and atraumatic.  Cardiovascular:     Rate and Rhythm: Normal rate and regular rhythm.     Pulses: Normal pulses.     Heart sounds: Normal heart sounds. No murmur heard. No friction rub. No gallop.   Pulmonary:     Effort: Pulmonary effort is normal. No respiratory distress.     Breath sounds: Normal breath sounds. No stridor. No wheezing, rhonchi or rales.  Chest:     Chest wall: No tenderness.  Abdominal:     General: Abdomen is flat. Bowel sounds are normal.     Palpations: Abdomen is soft.     Tenderness: There is no abdominal tenderness. There is no guarding.  Musculoskeletal:     Right lower leg: No edema.     Left lower leg: No edema.  Neurological:     Mental Status: He is alert.           Assessment & Plan:  Screening cholesterol level - Plan: CBC with Differential/Platelet, COMPLETE METABOLIC PANEL WITH GFR, Lipid  panel  Primary hypertension  Mixed hyperlipidemia   Blood pressure today is well controlled.  I will make no changes in his antihypertensive medication.  Check CMP and fasting lipid panel.  Goal LDL cholesterol is less than 100.  Continue Crestor at the present time as the patient is suffering no ill side effects. Patient has chronic  pain in his right bicep secondary to a distal biceps tendon rupture.  I will continue to refill the patient's tramadol that he uses 1-2 times a day for right arm pain.  I will also continue to refill the patient's Ambien that he uses for insomnia.  Next PSA is due in June of this year.  Next colonoscopy is due in 2023.

## 2021-04-25 ENCOUNTER — Encounter: Payer: Self-pay | Admitting: *Deleted

## 2021-04-25 LAB — CBC WITH DIFFERENTIAL/PLATELET
Absolute Monocytes: 568 cells/uL (ref 200–950)
Basophils Absolute: 33 cells/uL (ref 0–200)
Basophils Relative: 0.5 %
Eosinophils Absolute: 59 cells/uL (ref 15–500)
Eosinophils Relative: 0.9 %
HCT: 45.7 % (ref 38.5–50.0)
Hemoglobin: 14.9 g/dL (ref 13.2–17.1)
Lymphs Abs: 1756 cells/uL (ref 850–3900)
MCH: 30.3 pg (ref 27.0–33.0)
MCHC: 32.6 g/dL (ref 32.0–36.0)
MCV: 93.1 fL (ref 80.0–100.0)
MPV: 11.2 fL (ref 7.5–12.5)
Monocytes Relative: 8.6 %
Neutro Abs: 4184 cells/uL (ref 1500–7800)
Neutrophils Relative %: 63.4 %
Platelets: 278 10*3/uL (ref 140–400)
RBC: 4.91 10*6/uL (ref 4.20–5.80)
RDW: 12 % (ref 11.0–15.0)
Total Lymphocyte: 26.6 %
WBC: 6.6 10*3/uL (ref 3.8–10.8)

## 2021-04-25 LAB — COMPLETE METABOLIC PANEL WITH GFR
AG Ratio: 1.6 (calc) (ref 1.0–2.5)
ALT: 30 U/L (ref 9–46)
AST: 23 U/L (ref 10–35)
Albumin: 4.2 g/dL (ref 3.6–5.1)
Alkaline phosphatase (APISO): 65 U/L (ref 35–144)
BUN: 12 mg/dL (ref 7–25)
CO2: 27 mmol/L (ref 20–32)
Calcium: 9.2 mg/dL (ref 8.6–10.3)
Chloride: 104 mmol/L (ref 98–110)
Creat: 0.75 mg/dL (ref 0.70–1.25)
GFR, Est African American: 115 mL/min/{1.73_m2} (ref >=60)
GFR, Est Non African American: 99 mL/min/{1.73_m2} (ref >=60)
Globulin: 2.7 g/dL (calc) (ref 1.9–3.7)
Glucose, Bld: 125 mg/dL — ABNORMAL HIGH (ref 65–99)
Potassium: 4.9 mmol/L (ref 3.5–5.3)
Sodium: 140 mmol/L (ref 135–146)
Total Bilirubin: 0.6 mg/dL (ref 0.2–1.2)
Total Protein: 6.9 g/dL (ref 6.1–8.1)

## 2021-04-25 LAB — TEST AUTHORIZATION

## 2021-04-25 LAB — LIPID PANEL
Cholesterol: 171 mg/dL (ref <200)
HDL: 47 mg/dL (ref >=40)
LDL Cholesterol (Calc): 92 mg/dL (calc)
Non-HDL Cholesterol (Calc): 124 mg/dL (calc) (ref <130)
Total CHOL/HDL Ratio: 3.6 (calc) (ref <5.0)
Triglycerides: 228 mg/dL — ABNORMAL HIGH (ref <150)

## 2021-04-25 LAB — HEMOGLOBIN A1C W/OUT EAG: Hgb A1c MFr Bld: 5.7 % of total Hgb — ABNORMAL HIGH (ref <5.7)

## 2021-05-06 ENCOUNTER — Other Ambulatory Visit: Payer: Self-pay | Admitting: *Deleted

## 2021-05-06 MED ORDER — TRAMADOL HCL 50 MG PO TABS: 50.0000 mg | ORAL_TABLET | Freq: Four times a day (QID) | ORAL | 0 refills | Status: DC | PRN

## 2021-05-06 NOTE — Telephone Encounter (Signed)
Received fax requesting refill on Tramadol.   Ok to refill??  Last office visit 04/28/2021.  Last refill 04/11/2021.

## 2021-05-09 ENCOUNTER — Other Ambulatory Visit: Payer: Self-pay | Admitting: *Deleted

## 2021-05-09 MED ORDER — ROSUVASTATIN CALCIUM 20 MG PO TABS: 20.0000 mg | ORAL_TABLET | Freq: Every day | ORAL | 0 refills | Status: DC

## 2021-05-12 NOTE — Progress Notes (Deleted)
Chronic Care Management Pharmacy Note  05/12/2021 Name:  Curtis Lucas MRN:  [id number] DOB:  1961-08-20  Subjective: Curtis Lucas is an 60 y.o. year old male who is a primary patient of Pickard, Cammie Mcgee, MD.  The CCM team was consulted for assistance with disease management and care coordination needs.    Engaged with patient by telephone for follow up visit in response to provider referral for pharmacy case management and/or care coordination services.   Consent to Services:  The patient was given the following information about Chronic Care Management services today, agreed to services, and gave verbal consent: 1. CCM service includes personalized support from designated clinical staff supervised by the primary care provider, including individualized plan of care and coordination with other care providers 2. 24/7 contact phone numbers for assistance for urgent and routine care needs. 3. Service will only be billed when office clinical staff spend 20 minutes or more in a month to coordinate care. 4. Only one practitioner may furnish and bill the service in a calendar month. 5.The patient may stop CCM services at any time (effective at the end of the month) by phone call to the office staff. 6. The patient will be responsible for cost sharing (co-pay) of up to 20% of the service fee (after annual deductible is met). Patient agreed to services and consent obtained.  Patient Care Team: Susy Frizzle, MD as PCP - General (Family Medicine) Edythe Clarity, [facility] as Pharmacist (Pharmacist)  Recent office visits: 04/21/21 Dennard Schaumann) - regular follow up.  No changes to medications, A1c was in pre-diabetic range patient was instructed to watch carbs, etc.  Recent consult visits: None since last CCM call  Hospital visits: None in previous 6 months  Objective:  Lab Results  Component Value Date   CREATININE 0.75 04/21/2021   BUN 12 04/21/2021   GFRNONAA 99 04/21/2021   GFRAA  115 04/21/2021   NA 140 04/21/2021   K 4.9 04/21/2021   CALCIUM 9.2 04/21/2021   CO2 27 04/21/2021   GLUCOSE 125 (H) 04/21/2021    Lab Results  Component Value Date/Time   HGBA1C 5.7 (H) 04/21/2021 12:37 PM   HGBA1C 5.6 06/04/2020 10:16 AM    Last diabetic Eye exam: No results found for: HMDIABEYEEXA  Last diabetic Foot exam: No results found for: HMDIABFOOTEX   Lab Results  Component Value Date   CHOL 171 04/21/2021   HDL 47 04/21/2021   LDLCALC 92 04/21/2021   TRIG 228 (H) 04/21/2021   CHOLHDL 3.6 04/21/2021    Hepatic Function Latest Ref Rng & Units 04/21/2021 06/04/2020 11/28/2019  Total Protein 6.1 - 8.1 g/dL 6.9 7.1 6.8  Albumin 3.6 - 5.1 g/dL - - -  AST 10 - 35 U/L 23 39(H) 23  ALT 9 - 46 U/L 30 55(H) 37  Alk Phosphatase 40 - 115 U/L - - -  Total Bilirubin 0.2 - 1.2 mg/dL 0.6 0.5 0.6    Lab Results  Component Value Date/Time   TSH 0.736 05/02/2015 08:37 AM    CBC Latest Ref Rng & Units 04/21/2021 06/04/2020 11/28/2019  WBC 3.8 - 10.8 Thousand/uL 6.6 5.4 5.9  Hemoglobin 13.2 - 17.1 g/dL 14.9 14.8 14.7  Hematocrit 38.5 - 50.0 % 45.7 43.7 43.0  Platelets 140 - 400 Thousand/uL 278 287 250    Lab Results  Component Value Date/Time   VD25OH 43 07/16/2016 08:54 AM   VD25OH 23 (L) 01/15/2016 08:48 AM    Clinical ASCVD: {YES/NO:21197}  The ASCVD Risk score Mikey Bussing DC Jr., et al., 2013) failed to calculate for the following reasons:   The patient has a prior MI or stroke diagnosis    Depression screen [facility] 2/9 08/06/2020 08/15/2018 02/14/2018  Decreased Interest 0 2 1  Down, Depressed, Hopeless 0 2 0  PHQ - 2 Score 0 4 1  Altered sleeping - 3 1  Tired, decreased energy - 2 1  Change in appetite - 2 0  Feeling bad or failure about yourself  - 2 0  Trouble concentrating - 2 1  Moving slowly or fidgety/restless - 2 0  Suicidal thoughts - 2 0  PHQ-9 Score - 19 4  Difficult doing work/chores - Extremely dIfficult Very difficult     ***Other: (CHADS2VASc if Afib, MMRC  or CAT for COPD, ACT, DEXA)  Social History   Tobacco Use  Smoking Status Never Smoker  Smokeless Tobacco Never Used   BP Readings from Last 3 Encounters:  04/21/21 140/90  08/06/20 (!) 138/96  06/04/20 (!) 140/92   Pulse Readings from Last 3 Encounters:  04/21/21 64  08/06/20 73  06/04/20 89   Wt Readings from Last 3 Encounters:  04/21/21 216 lb 9.6 oz (98.2 kg)  08/06/20 218 lb (98.9 kg)  06/04/20 213 lb 3.2 oz (96.7 kg)   BMI Readings from Last 3 Encounters:  04/21/21 31.08 kg/m  08/06/20 31.28 kg/m  06/04/20 30.59 kg/m    Assessment/Interventions: Review of patient past medical history, allergies, medications, health status, including review of consultants reports, laboratory and other test data, was performed as part of comprehensive evaluation and provision of chronic care management services.   SDOH:  (Social Determinants of Health) assessments and interventions performed: {yes/no:20286}  SDOH Screenings   Alcohol Screen: Not on file  Depression (PHQ2-9): Low Risk   . PHQ-2 Score: 0  Financial Resource Strain: Not on file  Food Insecurity: Not on file  Housing: Not on file  Physical Activity: Not on file  Social Connections: Not on file  Stress: Not on file  Tobacco Use: Low Risk   . Smoking Tobacco Use: Never Smoker  . Smokeless Tobacco Use: Never Used  Transportation Needs: Not on file    CCM Care Plan  Allergies  Allergen Reactions  . Lipitor [Atorvastatin Calcium]     Severe muscle spasms    Medications Reviewed Today    Reviewed by Edythe Clarity, [facility] (Pharmacist) on 11/12/20 at [place] List Status: <None>  Medication Order Taking? Sig Documenting Provider Last Dose Status Informant  amLODipine (NORVASC) 10 MG tablet [id number] Yes TAKE ONE TABLET BY MOUTH DAILY Susy Frizzle, MD Taking Active   aspirin 81 MG tablet [id number] Yes Take 81 mg by mouth daily. [provider] Taking Active Self  atenolol (TENORMIN) 100 MG  tablet [id number] Yes Take 1 tablet (100 mg total) by mouth daily. Susy Frizzle, MD Taking Active   doxycycline (VIBRA-TABS) 100 MG tablet [id number] Yes Take 1 tablet (100 mg total) by mouth 2 (two) times daily. Annie Main, FNP Taking Active   escitalopram (LEXAPRO) 20 MG tablet [id number] Yes Take 1 tablet (20 mg total) by mouth daily. Susy Frizzle, MD Taking Active   Omega-3 Fatty Acids (FISH OIL) 1200 MG CAPS [id number] Yes Take 1 capsule by mouth at bedtime.  [provider] Taking Active Self  pantoprazole (PROTONIX) 40 MG tablet [id number] Yes Take 1 tablet (40 mg total) by mouth daily. Susy Frizzle, MD Taking Active  rosuvastatin (CRESTOR) 20 MG tablet [id number] Yes Take 1 tablet (20 mg total) by mouth daily. Alycia Rossetti, MD Taking Active   tamsulosin [facility]) 0.4 MG CAPS capsule [id number] Yes TAKE ONE CAPSULE BY MOUTH DAILY Susy Frizzle, MD Taking Active   traMADol (ULTRAM) 50 MG tablet [id number] Yes Take 1 tablet (50 mg total) by mouth every 6 (six) hours as needed. Alycia Rossetti, MD Taking Active   zolpidem (AMBIEN) 10 MG tablet [id number] Yes TAKE ONE TABLET BY MOUTH EVERY NIGHT AT BEDTIME AS NEEDED FOR SLEEP Susy Frizzle, MD Taking Active           Patient Active Problem List   Diagnosis Date Noted  . Steatosis of liver 06/06/2020  . Renal calculus, right 06/06/2020  . Mixed hyperlipidemia 08/31/2019  . Olecranon bursitis of left elbow 07/22/2017  . Impingement syndrome of right shoulder 07/08/2017  . Rupture of right distal biceps tendon 02/11/2017  . Generalized anxiety disorder 01/20/2017  . Prostate cancer screening 01/20/2017  . Vitamin D deficiency 05/06/2015  . Visual disturbance 05/02/2015  . Insomnia   . S/P right THA, AA 05/03/2012  . Hypertension   . GERD (gastroesophageal reflux disease)   . CVA (cerebral vascular accident) ([place])   . Allergy   . Elevated lipids   . Hyperglycemia     Immunization History   Administered Date(s) Administered  . PFIZER(Purple Top)SARS-COV-2 Vaccination 03/18/2020, 04/09/2020  . Td 07/22/2003  . Tdap 04/20/2014    Conditions to be addressed/monitored:  hypertension, GERD, hyperlipidemia.  There are no care plans that you recently modified to display for this patient.    Medication Assistance: {MEDASSISTANCEINFO:25044}  Patient's preferred pharmacy is:  [facility] [address], [place] [facility] [address] [place] [address] Phone: [phone] Fax: [phone]  Uses pill box? {Yes or If no, why not?:20788} Pt endorses ***% compliance  We discussed: {Pharmacy options:24294} Patient decided to: {US Pharmacy Plan:23885}  Care Plan and Follow Up Patient Decision:  {FOLLOWUP:24991}  Plan: {CM FOLLOW UP HALP:37902}  *** Current Barriers:  . {pharmacybarriers:24917}  Pharmacist Clinical Goal(s):  [name] Patient will {PHARMACYGOALCHOICES:24921} through collaboration with PharmD and provider.   Interventions: . 1:1 collaboration with Susy Frizzle, MD regarding development and update of comprehensive plan of care as evidenced by provider attestation and co-signature . Inter-disciplinary care team collaboration (see longitudinal plan of care) . Comprehensive medication review performed; medication list updated in electronic medical record  Hypertension (BP goal {CHL HP UPSTREAM Pharmacist BP ranges:[phone]}) -{US controlled/uncontrolled:25276} -Current treatment: . *** -Medications previously tried: ***  -Current home readings: *** -Current dietary habits: *** -Current exercise habits: *** -{ACTIONS;DENIES/REPORTS:21021675::"Denies"} hypotensive/hypertensive symptoms -Educated on {CCM BP Counseling:25124} -Counseled to monitor BP at home ***, document, and provide log at future appointments -{CCMPHARMDINTERVENTION:25122}  Hyperlipidemia: (LDL goal < ***) -{US controlled/uncontrolled:25276} -Current  treatment: . *** -Medications previously tried: ***  -Current dietary patterns: *** -Current exercise habits: *** -Educated on {CCM HLD Counseling:25126} -{CCMPHARMDINTERVENTION:25122}  GERD (Goal: ***) -{US controlled/uncontrolled:25276} -Current treatment  . *** -Medications previously tried: ***  -{CCMPHARMDINTERVENTION:25122}   Patient Goals/Self-Care Activities . Patient will:  - {pharmacypatientgoals:24919}  Follow Up Plan: {CM FOLLOW UP IOXB:35329}

## 2021-05-13 ENCOUNTER — Telehealth: Payer: Self-pay

## 2021-05-22 ENCOUNTER — Other Ambulatory Visit: Payer: Self-pay | Admitting: Family Medicine

## 2021-05-22 NOTE — Telephone Encounter (Signed)
Ok to refill??  Last office visit 04/21/2021.  Last refill 05/06/2021.

## 2021-05-26 ENCOUNTER — Telehealth: Payer: Self-pay | Admitting: Family Medicine

## 2021-05-26 NOTE — Telephone Encounter (Signed)
Pt called in stating that he thinks he may have Vertigo, and wanted to know if there was any meds that could be called in for this.  Cb#: [phone]

## 2021-05-27 ENCOUNTER — Ambulatory Visit
Admission: EM | Admit: 2021-05-27 | Discharge: 2021-05-27 | Disposition: A | Discharge status: Home / Self Care | Payer: Medicare HMO | Attending: Family Medicine | Admitting: Family Medicine

## 2021-05-27 ENCOUNTER — Encounter: Payer: Self-pay | Admitting: Emergency Medicine

## 2021-05-27 ENCOUNTER — Other Ambulatory Visit: Payer: Self-pay

## 2021-05-27 DIAGNOSIS — R42 Dizziness and giddiness: Secondary | ICD-10-CM | POA: Diagnosis not present

## 2021-05-27 MED ORDER — MECLIZINE HCL 25 MG PO TABS: 25.0000 mg | ORAL_TABLET | Freq: Three times a day (TID) | ORAL | 0 refills | Status: DC | PRN

## 2021-05-27 NOTE — Telephone Encounter (Signed)
Call placed to patient.   Advised that there are multiple causes of vertigo. Advised that he will need to be seen to determine best course of action.   States that he will call back if he needs to.

## 2021-05-27 NOTE — Discharge Instructions (Signed)
I have sent in meclizine for you to take three times per day as needed for vertigo  Follow up with this office or with primary care if symptoms are persisting.  Follow up in the ER for high fever, trouble swallowing, trouble breathing, other concerning symptoms.

## 2021-05-27 NOTE — ED Triage Notes (Signed)
States he woke up Saturday morning feeling dizzy, no other symptoms.

## 2021-05-29 ENCOUNTER — Other Ambulatory Visit: Payer: Self-pay | Admitting: *Deleted

## 2021-05-29 DIAGNOSIS — F411 Generalized anxiety disorder: Secondary | ICD-10-CM

## 2021-05-29 MED ORDER — ESCITALOPRAM OXALATE 20 MG PO TABS
20.0000 mg | ORAL_TABLET | Freq: Every day | ORAL | 2 refills | Status: DC
Start: 2021-05-29 — End: 2022-02-24

## 2021-05-29 MED ORDER — ATENOLOL 100 MG PO TABS: 100.0000 mg | ORAL_TABLET | Freq: Every day | ORAL | 2 refills | Status: DC

## 2021-05-29 MED ORDER — ROSUVASTATIN CALCIUM 20 MG PO TABS: 20.0000 mg | ORAL_TABLET | Freq: Every day | ORAL | 2 refills | Status: DC

## 2021-05-30 NOTE — ED Provider Notes (Signed)
[facility]   [id number] 05/27/21 Arrival Time: 1216  CC: DIZZINESS  SUBJECTIVE:  Curtis Lucas is a 60 y.o. male who presents with complaint of dizziness that began 2 days ago. Denies a precipitating event, trauma, or recent URI within the past month. Describes the dizziness as "the room spinning" "being on a boat" "unsteady to walk." States that it is intermittent with episodes lasting less than one minute. Has not taken OTC medications for this. Symptoms made worse with movement and position changes. Denies to previous symptoms. Denies fever, chills, nausea, vomiting, hearing changes, tinnitus, ear pain, chest pain, syncope, SOB, weakness, slurred speech, memory or emotional changes, facial drooping/ asymmetry, incoordination, numbness or tingling, abdominal pain, changes in bowel or bladder habits.    ROS: As per HPI.  All other pertinent ROS negative.    Past Medical History:  Diagnosis Date   Allergy    Rhinitis   Anxiety    Arthritis    Biceps rupture, distal, right, initial encounter    CVA (cerebral vascular accident) ([place])    Depression    Difficulty sleeping    Elevated lipids    GERD (gastroesophageal reflux disease)    Hyperglycemia    Hyperlipidemia    Hypertension    Insomnia    Past Surgical History:  Procedure Laterality Date   COLONOSCOPY  09/24/2003   [facility]--normal per pt.   DISTAL BICEPS TENDON REPAIR Right 02/24/2017   Procedure: RIGHT DISTAL BICEPS TENDON REPAIR;  Surgeon: Leandrew Koyanagi, MD;  Location: [facility];  Service: Orthopedics;  Laterality: Right;   HEAD INJURY   1987   JOINT REPLACEMENT     LUMBAR EPIDURAL INJECTION     RECTAL SURGERY  2006   TOTAL HIP ARTHROPLASTY     TOTAL HIP ARTHROPLASTY  05/03/2012   Procedure: TOTAL HIP ARTHROPLASTY ANTERIOR APPROACH;  Surgeon: Mauri Pole, MD;  Location: [facility];  Service: Orthopedics;  Laterality: Right;   Allergies  Allergen Reactions   Lipitor [Atorvastatin Calcium]      Severe muscle spasms   No current facility-administered medications on file prior to encounter.   Current Outpatient Medications on File Prior to Encounter  Medication Sig Dispense Refill   amLODipine (NORVASC) 10 MG tablet Take 1 tablet (10 mg total) by mouth daily. 90 tablet 2   aspirin 81 MG tablet Take 81 mg by mouth daily.     doxycycline (VIBRA-TABS) 100 MG tablet Take 1 tablet (100 mg total) by mouth 2 (two) times daily. 42 tablet 0   Omega-3 Fatty Acids (FISH OIL) 1200 MG CAPS Take 1 capsule by mouth at bedtime.      pantoprazole (PROTONIX) 40 MG tablet Take 1 tablet (40 mg total) by mouth daily. 90 tablet 2   tamsulosin (FLOMAX) 0.4 MG CAPS capsule TAKE ONE CAPSULE BY MOUTH DAILY 30 capsule 6   traMADol (ULTRAM) 50 MG tablet TAKE ONE TABLET BY MOUTH EVERY 6 HOURS AS NEEDED 30 tablet 0   zolpidem (AMBIEN) 10 MG tablet TAKE ONE TABLET BY MOUTH EVERY NIGHT AT BEDTIME AS NEEDED FOR SLEEP 30 tablet 3   Social History   Socioeconomic History   Marital status: Married    Spouse name: Debbie   Number of children: 0   Years of education: HS   Highest education level: Not on file  Occupational History    Employer: [employer]    Comment: Product manager  Tobacco Use   Smoking status: Never  Smokeless tobacco: Never  Substance and Sexual Activity   Alcohol use: Yes    Comment: 4-5 BEERS PER WK   Drug use: Yes    Types: Marijuana   Sexual activity: Yes  Other Topics Concern   Not on file  Social History Narrative   Patient lives at home with spouse.   Caffeine Use:4 16oz Mt. Dew sodas daily   Social Determinants of Health   Financial Resource Strain: Not on file  Food Insecurity: Not on file  Transportation Needs: Not on file  Physical Activity: Not on file  Stress: Not on file  Social Connections: Not on file  Intimate Partner Violence: Not on file   Family History  Problem Relation Age of Onset   Stroke Mother    Cancer Mother        colon    Dementia Mother    Colon cancer Mother    Parkinson's disease Father    Heart attack Paternal Grandfather    Dementia Paternal Grandmother    Colon polyps Neg Hx    Esophageal cancer Neg Hx    Rectal cancer Neg Hx    Stomach cancer Neg Hx     OBJECTIVE:  Vitals:   05/27/21 1223  BP: 137/90  Pulse: 60  Resp: 16  Temp: 99.3 F (37.4 C)  TempSrc: Oral  SpO2: 95%    General appearance: alert; no distress Eyes: PERRLA; EOMI; conjunctiva normal HENT: normocephalic; atraumatic; TMs normal; nasal mucosa normal; oral mucosa normal Neck: supple with FROM Lungs: clear to auscultation bilaterally Heart: regular rate and rhythm Abdomen: soft, non-tender; bowel sounds normal Extremities: no cyanosis or edema; symmetrical with no gross deformities Skin: warm and dry Neurologic: normal gait; normal symmetric reflexes; CN 2-12 grossly intact Psychological: alert and cooperative; normal mood and affect  Labs:  No results found for this or any previous visit (from the past 24 hour(s)). Orders placed or performed in visit on 08/31/19   EKG 12-Lead    No results found.  ASSESSMENT & PLAN:  1. Vertigo     Meds ordered this encounter  Medications   meclizine (ANTIVERT) 25 MG tablet    Sig: Take 1 tablet (25 mg total) by mouth 3 (three) times daily as needed for dizziness.    Dispense:  30 tablet    Refill:  0    Order Specific Question:   Supervising Provider    Answer:   Chase Picket [id number]   Prescribed meclizine TID prn dizziness Reviewed expectations re: course of current medical issues. Questions answered. Outlined signs and symptoms indicating need for more acute intervention. Patient verbalized understanding. After Visit Summary given.     Faustino Congress, NP 05/30/21 [phone]

## 2021-06-25 ENCOUNTER — Telehealth: Payer: Self-pay | Admitting: Pharmacist

## 2021-06-25 NOTE — Progress Notes (Addendum)
Chronic Care Management Pharmacy Assistant   Name: Curtis Lucas  MRN: [id number] DOB: [birth year]  Reason for Encounter: Disease State For HTN.   Conditions to be addressed/monitored: hypertension, GERD, hyperlipidemia.  Recent office visits:  04/21/21 Dr. Dennard Schaumann For follow-up. No medication changes.  Recent consult visits: None since 04/21/21  Hospital visits:  05/27/21 [facility] at [place] (30 min) For vertigo. STARTED Meclizine 25 mg 2 times daily PRN.   Medications: Outpatient Encounter Medications as of 06/25/2021  Medication Sig   amLODipine (NORVASC) 10 MG tablet Take 1 tablet (10 mg total) by mouth daily.   aspirin 81 MG tablet Take 81 mg by mouth daily.   atenolol (TENORMIN) 100 MG tablet Take 1 tablet (100 mg total) by mouth daily.   doxycycline (VIBRA-TABS) 100 MG tablet Take 1 tablet (100 mg total) by mouth 2 (two) times daily.   escitalopram (LEXAPRO) 20 MG tablet Take 1 tablet (20 mg total) by mouth daily.   meclizine (ANTIVERT) 25 MG tablet Take 1 tablet (25 mg total) by mouth 3 (three) times daily as needed for dizziness.   Omega-3 Fatty Acids (FISH OIL) 1200 MG CAPS Take 1 capsule by mouth at bedtime.    pantoprazole (PROTONIX) 40 MG tablet Take 1 tablet (40 mg total) by mouth daily.   rosuvastatin (CRESTOR) 20 MG tablet Take 1 tablet (20 mg total) by mouth daily.   tamsulosin (FLOMAX) 0.4 MG CAPS capsule TAKE ONE CAPSULE BY MOUTH DAILY   traMADol (ULTRAM) 50 MG tablet TAKE ONE TABLET BY MOUTH EVERY 6 HOURS AS NEEDED   zolpidem (AMBIEN) 10 MG tablet TAKE ONE TABLET BY MOUTH EVERY NIGHT AT BEDTIME AS NEEDED FOR SLEEP   No facility-administered encounter medications on file as of 06/25/2021.    Reviewed chart prior to disease state call. Spoke with patient regarding BP  Recent Office Vitals: BP Readings from Last 3 Encounters:  05/27/21 137/90  04/21/21 140/90  08/06/20 (!) 138/96   Pulse Readings from Last 3 Encounters:  05/27/21 60   04/21/21 64  08/06/20 73    Wt Readings from Last 3 Encounters:  04/21/21 216 lb 9.6 oz (98.2 kg)  08/06/20 218 lb (98.9 kg)  06/04/20 213 lb 3.2 oz (96.7 kg)     Kidney Function Lab Results  Component Value Date/Time   CREATININE 0.75 04/21/2021 12:37 PM   CREATININE 0.83 06/04/2020 10:16 AM   GFRNONAA 99 04/21/2021 12:37 PM   GFRAA 115 04/21/2021 12:37 PM    BMP Latest Ref Rng & Units 04/21/2021 06/04/2020 11/28/2019  Glucose 65 - 99 mg/dL 125(H) 117(H) 118(H)  BUN 7 - 25 mg/dL 12 13 14   Creatinine 0.70 - 1.25 mg/dL 0.75 0.83 0.81  BUN/Creat Ratio 6 - 22 (calc) NOT APPLICABLE NOT APPLICABLE NOT APPLICABLE  Sodium 381 - 146 mmol/L 140 143 142  Potassium 3.5 - 5.3 mmol/L 4.9 4.4 5.1  Chloride 98 - 110 mmol/L 104 104 105  CO2 20 - 32 mmol/L 27 27 28   Calcium 8.6 - 10.3 mg/dL 9.2 9.5 9.4    Current antihypertensive regimen:  Amlodipine 10mg  daily Atenolol 100mg  daily  How often are you checking your Blood Pressure? Patient only checks his blood pressure when a blood pressure cuff is available.   Current home BP readings: Patient only checks his blood pressure when a blood pressure cuff is available.   What recent interventions/DTPs have been made by any provider to improve Blood Pressure control since last CPP Visit: None.  Any recent hospitalizations or ED visits since last visit with CPP? Yes, documented above.   What diet changes have been made to improve Blood Pressure Control?  Patient stated he ensures he eats a well rounded diet and I encouraged him to drink more water.  What exercise is being done to improve your Blood Pressure Control?  Patient stated he is still very active, he gets outside daily with his dogs.   Adherence Review: Is the patient currently on ACE/ARB medication? N/A  Does the patient have >5 day gap between last estimated fill dates? N/A  Star Rating Drugs: Rosuvastatin 20 mg 90 DS 05/09/21  Patient stated he is still having episodes of  vertigo but still has some Meclizine 25 mg available.   Follow-Up:Pharmacist Review   [name], RMA Clinical Pharmacist Assistant [phone]  10 minutes spent in review, coordination, and documentation.  Reviewed by: Beverly Milch, PharmD Clinical Pharmacist [facility] [phone]

## 2021-07-02 ENCOUNTER — Other Ambulatory Visit: Payer: Self-pay | Admitting: Family Medicine

## 2021-07-02 NOTE — Telephone Encounter (Signed)
Ok to refill??  Last office visit 04/21/2021.  Last refill 05/22/2021.

## 2021-07-03 ENCOUNTER — Other Ambulatory Visit: Payer: Self-pay | Admitting: *Deleted

## 2021-07-03 MED ORDER — ZOLPIDEM TARTRATE 10 MG PO TABS: ORAL_TABLET | ORAL | 3 refills | Status: DC

## 2021-07-03 NOTE — Telephone Encounter (Signed)
Received fax requesting refill on Ambien.   Ok to refill??  Last office visit 04/21/2021.  Last refill 02/04/2021, #3 refills.

## 2021-07-17 ENCOUNTER — Telehealth: Payer: Self-pay | Admitting: Pharmacist

## 2021-07-17 NOTE — Progress Notes (Addendum)
    Chronic Care Management Pharmacy Assistant   Name: Curtis Lucas  MRN: [id number] DOB: Jul 04, 1961  Reason for Encounter: Adherence Review  Medications: Outpatient Encounter Medications as of 07/17/2021  Medication Sig   amLODipine (NORVASC) 10 MG tablet Take 1 tablet (10 mg total) by mouth daily.   aspirin 81 MG tablet Take 81 mg by mouth daily.   atenolol (TENORMIN) 100 MG tablet Take 1 tablet (100 mg total) by mouth daily.   doxycycline (VIBRA-TABS) 100 MG tablet Take 1 tablet (100 mg total) by mouth 2 (two) times daily.   escitalopram (LEXAPRO) 20 MG tablet Take 1 tablet (20 mg total) by mouth daily.   meclizine (ANTIVERT) 25 MG tablet Take 1 tablet (25 mg total) by mouth 3 (three) times daily as needed for dizziness.   Omega-3 Fatty Acids (FISH OIL) 1200 MG CAPS Take 1 capsule by mouth at bedtime.    pantoprazole (PROTONIX) 40 MG tablet Take 1 tablet (40 mg total) by mouth daily.   rosuvastatin (CRESTOR) 20 MG tablet Take 1 tablet (20 mg total) by mouth daily.   tamsulosin (FLOMAX) 0.4 MG CAPS capsule TAKE ONE CAPSULE BY MOUTH DAILY   traMADol (ULTRAM) 50 MG tablet TAKE ONE TABLET BY MOUTH EVERY 6 HOURS AS NEEDED   zolpidem (AMBIEN) 10 MG tablet TAKE ONE TABLET BY MOUTH EVERY NIGHT AT BEDTIME AS NEEDED FOR SLEEP   No facility-administered encounter medications on file as of 07/17/2021.   Reviewed the patients chart for any medical/health and/or medication changes there were not any medication changes at this time. Reviewed the patients star rating drugs, medications refills were up to date. The patients most recent A1C was 5.7. The patients most recent blood pressure was 140/90.   Follow-Up:Pharmacist Review  [name], [place] Pharmacist Assistant [phone]

## 2021-07-31 ENCOUNTER — Other Ambulatory Visit: Payer: Self-pay | Admitting: Family Medicine

## 2021-08-28 ENCOUNTER — Telehealth: Payer: Self-pay | Admitting: Pharmacist

## 2021-08-28 NOTE — Progress Notes (Addendum)
Chronic Care Management Pharmacy Assistant   Name: IAM SENDER  MRN: [id number] DOB: 07-10-1961   Reason for Encounter: Disease State call for Hypertension    Conditions to be addressed/monitored: hypertension, GERD, hyperlipidemia.  Recent office visits:  None since 07/17/21  Recent consult visits:  None since 07/17/21  Hospital visits:  None since 07/17/21  Medications: Outpatient Encounter Medications as of 08/28/2021  Medication Sig   amLODipine (NORVASC) 10 MG tablet Take 1 tablet (10 mg total) by mouth daily.   aspirin 81 MG tablet Take 81 mg by mouth daily.   atenolol (TENORMIN) 100 MG tablet Take 1 tablet (100 mg total) by mouth daily.   doxycycline (VIBRA-TABS) 100 MG tablet Take 1 tablet (100 mg total) by mouth 2 (two) times daily.   escitalopram (LEXAPRO) 20 MG tablet Take 1 tablet (20 mg total) by mouth daily.   meclizine (ANTIVERT) 25 MG tablet Take 1 tablet (25 mg total) by mouth 3 (three) times daily as needed for dizziness.   Omega-3 Fatty Acids (FISH OIL) 1200 MG CAPS Take 1 capsule by mouth at bedtime.    pantoprazole (PROTONIX) 40 MG tablet Take 1 tablet (40 mg total) by mouth daily.   rosuvastatin (CRESTOR) 20 MG tablet Take 1 tablet (20 mg total) by mouth daily.   tamsulosin (FLOMAX) 0.4 MG CAPS capsule TAKE ONE CAPSULE BY MOUTH DAILY   traMADol (ULTRAM) 50 MG tablet TAKE ONE TABLET BY MOUTH EVERY 6 HOURS AS NEEDED   zolpidem (AMBIEN) 10 MG tablet TAKE ONE TABLET BY MOUTH EVERY NIGHT AT BEDTIME AS NEEDED FOR SLEEP   No facility-administered encounter medications on file as of 08/28/2021.   Reviewed chart prior to disease state call. Spoke with patient regarding BP  Recent Office Vitals: BP Readings from Last 3 Encounters:  05/27/21 137/90  04/21/21 140/90  08/06/20 (!) 138/96   Pulse Readings from Last 3 Encounters:  05/27/21 60  04/21/21 64  08/06/20 73    Wt Readings from Last 3 Encounters:  04/21/21 216 lb 9.6 oz (98.2 kg)  08/06/20 218  lb (98.9 kg)  06/04/20 213 lb 3.2 oz (96.7 kg)     Kidney Function Lab Results  Component Value Date/Time   CREATININE 0.75 04/21/2021 12:37 PM   CREATININE 0.83 06/04/2020 10:16 AM   GFRNONAA 99 04/21/2021 12:37 PM   GFRAA 115 04/21/2021 12:37 PM    BMP Latest Ref Rng & Units 04/21/2021 06/04/2020 11/28/2019  Glucose 65 - 99 mg/dL 125(H) 117(H) 118(H)  BUN 7 - 25 mg/dL '12 13 14  '$ Creatinine 0.70 - 1.25 mg/dL 0.75 0.83 0.81  BUN/Creat Ratio 6 - 22 (calc) NOT APPLICABLE NOT APPLICABLE NOT APPLICABLE  Sodium A999333 - 146 mmol/L 140 143 142  Potassium 3.5 - 5.3 mmol/L 4.9 4.4 5.1  Chloride 98 - 110 mmol/L 104 104 105  CO2 20 - 32 mmol/L '27 27 28  '$ Calcium 8.6 - 10.3 mg/dL 9.2 9.5 9.4    Current antihypertensive regimen:  Amlodipine 10 mg daily Atenolol 100 mg daily How often are you checking your Blood Pressure? Patient states he is not checking his BP on a regular basis due to not having a cuff but will check when he goes to the pharmacy.  Current home BP readings: Pt has not checked his BP recently  What recent interventions/DTPs have been made by any provider to improve Blood Pressure control since last CPP Visit: Patient states no changes  Any recent hospitalizations or ED visits since last  visit with CPP? Patient stated no recent ED visits  What diet changes have been made to improve Blood Pressure Control?  Patient states he is trying to eat more vegetables and is drinking more water What exercise is being done to improve your Blood Pressure Control?  Patient states he works daily and gets exercise on the job    Adherence Review: Is the patient currently on ACE/ARB medication? Yes Does the patient have >5 day gap between last estimated fill dates? Yes   Care Gaps: Ophthalmology Exam: N/A Foot Exam: N/A BP: 04/21/21 140/90 Annual Wellness: Due Dexa Scan: N/A Hemoglobin A1C: N/A Colonoscopy: Next Due 06/28/22   Star Rating Drugs: Rosuvastatin 20 mg last filled on 05/09/21  90 ds    Danielle Gerringer CMA  10 minutes spent in review, coordination, and documentation. AWV scheduled for 09/10/21.  Reviewed by: Beverly Milch, PharmD Clinical Pharmacist [phone]

## 2021-09-03 ENCOUNTER — Other Ambulatory Visit: Payer: Self-pay | Admitting: Family Medicine

## 2021-09-03 NOTE — Telephone Encounter (Signed)
Ok to refill??  Last office visit 04/21/2021.  Last refill 08/01/2021.

## 2021-09-09 NOTE — Progress Notes (Signed)
Patient: Curtis Lucas, Male    DOB: 06-18-1961, 60 y.o.   MRN: [id number]  Visit Date: 09/10/2021  Today's Provider: Eulogio Bear, NP   Chief Complaint  Patient presents with   MEDICARE AWV    Subjective:   Curtis Lucas is a 60 y.o. male who presents today for his Subsequent Annual Wellness Visit.  Care Team: Primary Care - Jenna Luo, [place], [facility]  HPI  HTN - atenolol 100 mg daily, amlodipine 10 mg daily.  Tolerating well.   History of CVA - taking baby aspirin, crestor 20 mg daily, tolerating well and taking faithfully.  GERD/fatty liver - Crestor 20 mg daily, pantoprazole 40 mg daily.    Insomnia/GAD - taking Ambien 10 mg daily, Lexapro 20 mg daily.  Reports Ambien does not help with sleep, he still cannot fall or stay asleep.  Wonders if it is because of his arm pain.  Review of Systems  Eyes: Negative.  Negative for visual disturbance.  Respiratory: Negative.  Negative for cough, shortness of breath and wheezing.   Cardiovascular: Negative.  Negative for chest pain.  Gastrointestinal: Negative.  Negative for abdominal pain, blood in stool, constipation, diarrhea, nausea and vomiting.  Skin: Negative.  Negative for rash.  Neurological:  Negative for dizziness, light-headedness and headaches.  Psychiatric/Behavioral:  Positive for sleep disturbance. The patient is not nervous/anxious.    Past Medical History:  Diagnosis Date   Allergy    Rhinitis   Anxiety    Arthritis    Biceps rupture, distal, right, initial encounter    CVA (cerebral vascular accident) ([place])    Depression    Difficulty sleeping    Elevated lipids    GERD (gastroesophageal reflux disease)    Hyperglycemia    Hyperlipidemia    Hypertension    Insomnia     Past Surgical History:  Procedure Laterality Date   COLONOSCOPY  09/24/2003   patterson--normal per pt.   DISTAL BICEPS TENDON REPAIR Right 02/24/2017   Procedure: RIGHT DISTAL BICEPS TENDON REPAIR;   Surgeon: Leandrew Koyanagi, MD;  Location: [place];  Service: Orthopedics;  Laterality: Right;   HEAD INJURY   1987   JOINT REPLACEMENT     LUMBAR EPIDURAL INJECTION     RECTAL SURGERY  2006   TOTAL HIP ARTHROPLASTY     TOTAL HIP ARTHROPLASTY  05/03/2012   Procedure: TOTAL HIP ARTHROPLASTY ANTERIOR APPROACH;  Surgeon: Mauri Pole, MD;  Location: [facility];  Service: Orthopedics;  Laterality: Right;    Family History  Problem Relation Age of Onset   Stroke Mother    Cancer Mother        colon   Dementia Mother    Colon cancer Mother    Parkinson's disease Father    Heart attack Paternal Grandfather    Dementia Paternal Grandmother    Colon polyps Neg Hx    Esophageal cancer Neg Hx    Rectal cancer Neg Hx    Stomach cancer Neg Hx     Social History   Socioeconomic History   Marital status: Married    Spouse name: Debbie   Number of children: 0   Years of education: HS   Highest education level: Not on file  Occupational History    Employer: [employer]    Comment: Product manager  Tobacco Use   Smoking status: Never   Smokeless tobacco: Never  Substance and Sexual Activity   Alcohol use: Yes  Comment: 4-5 BEERS PER WK   Drug use: Yes    Types: Marijuana   Sexual activity: Yes  Other Topics Concern   Not on file  Social History Narrative   Patient lives at home with spouse.   Caffeine Use:4 16oz Mt. Dew sodas daily   Social Determinants of Health   Financial Resource Strain: Not on file  Food Insecurity: Not on file  Transportation Needs: Not on file  Physical Activity: Not on file  Stress: Not on file  Social Connections: Not on file  Intimate Partner Violence: Not on file    Outpatient Encounter Medications as of 09/10/2021  Medication Sig   amLODipine (NORVASC) 10 MG tablet Take 1 tablet (10 mg total) by mouth daily.   aspirin 81 MG tablet Take 81 mg by mouth daily.   atenolol (TENORMIN) 100 MG tablet Take 1 tablet (100 mg  total) by mouth daily.   escitalopram (LEXAPRO) 20 MG tablet Take 1 tablet (20 mg total) by mouth daily.   Omega-3 Fatty Acids (FISH OIL) 1200 MG CAPS Take 1 capsule by mouth at bedtime.    pantoprazole (PROTONIX) 40 MG tablet Take 1 tablet (40 mg total) by mouth daily.   rosuvastatin (CRESTOR) 20 MG tablet Take 1 tablet (20 mg total) by mouth daily.   traMADol (ULTRAM) 50 MG tablet TAKE ONE TABLET BY MOUTH EVERY 6 HOURS AS NEEDED   zolpidem (AMBIEN) 10 MG tablet TAKE ONE TABLET BY MOUTH EVERY NIGHT AT BEDTIME AS NEEDED FOR SLEEP   [DISCONTINUED] tamsulosin (FLOMAX) 0.4 MG CAPS capsule TAKE ONE CAPSULE BY MOUTH DAILY   [DISCONTINUED] doxycycline (VIBRA-TABS) 100 MG tablet Take 1 tablet (100 mg total) by mouth 2 (two) times daily.   [DISCONTINUED] meclizine (ANTIVERT) 25 MG tablet Take 1 tablet (25 mg total) by mouth 3 (three) times daily as needed for dizziness. (Patient not taking: Reported on 09/10/2021)   No facility-administered encounter medications on file as of 09/10/2021.    Functional Status Survey: Is the patient deaf or have difficulty hearing?: No Does the patient have difficulty seeing, even when wearing glasses/contacts?: No Does the patient have difficulty concentrating, remembering, or making decisions?: Yes Does the patient have difficulty walking or climbing stairs?: Yes Does the patient have difficulty dressing or bathing?: Yes Does the patient have difficulty doing errands alone such as visiting a doctor's office or shopping?: Yes   Fall Risk Assessment Fall Risk  09/10/2021 08/06/2020 07/16/2016  Falls in the past year? 0 0 No  Number falls in past yr: 0 0 -  Injury with Fall? 0 0 -  Follow up Falls evaluation completed Falls evaluation completed -    Depression Screen Depression screen [facility] 2/9 09/10/2021 08/06/2020 08/15/2018 02/14/2018 08/11/2017  Decreased Interest 1 0 2 1 0  Down, Depressed, Hopeless 3 0 2 0 0  PHQ - 2 Score 4 0 4 1 0  Altered sleeping 3 - 3 1 0   Tired, decreased energy 2 - 2 1 0  Change in appetite 0 - 2 0 0  Feeling bad or failure about yourself  1 - 2 0 0  Trouble concentrating 3 - 2 1 0  Moving slowly or fidgety/restless 1 - 2 0 0  Suicidal thoughts 0 - 2 0 0  PHQ-9 Score 14 - 19 4 0  Difficult doing work/chores - - Extremely dIfficult Very difficult Not difficult at all    6CIT Screen 09/10/2021  What Year? 0 points  What month? 0 points  What time? 0 points  Count back from 20 4 points  Months in reverse 4 points  Repeat phrase 4 points  Total Score 12    Advanced Directives Does patient have a HCPOA?  Yes; wife If yes, name and contact information:  Does patient have a living will or MOST form? no  Objective:   Vitals: Ht 5\' 10"  (1.778 m)   Wt 215 lb (97.5 kg)   BMI 30.85 kg/m  Body mass index is 30.85 kg/m. No results found.  Physical Exam Medicare wellness exam.  Patient talking in complete sentences during telemedicine visit.  Assessment & Plan:    Annual Wellness Visit  Reviewed patient's Family Medical History  Reviewed and updated list of patient's medical providers  Assessment of cognitive impairment was done.  His cognitive impairment was decreased, unclear if this is his baseline after his stroke or if this is new.  Will collaborate with PCP.  Patient declines further work up regarding memory at this time.  PHQ-9 elevated.  Patient does not desire changing his Lexapro at this time.  Will collaborate with PCP.  Assessed patient's functional ability  Established a written schedule for health screening [place] Completed and Reviewed  Health Maintenance reviewed - I advised patient to get COVID booster, shingles shot, and flu shot.  Immunization History  Administered Date(s) Administered   PFIZER(Purple Top)SARS-COV-2 Vaccination 03/18/2020, 04/09/2020   Td 07/22/2003   Tdap 04/20/2014    Health Maintenance  Topic Date Due   Zoster Vaccines- Shingrix (1 of  2) Never done   COVID-19 Vaccine (3 - Booster for Pfizer series) 09/09/2020   INFLUENZA VACCINE  Never done   COLONOSCOPY (Pts 45-58yrs Insurance coverage will need to be confirmed)  06/28/2022   TETANUS/TDAP  04/20/2024   Hepatitis C Screening  Completed   HIV Screening  Completed   HPV VACCINES  Aged Out    Discussed health benefits of physical activity, and encouraged him to engage in regular exercise appropriate for his age and condition.   No orders of the defined types were placed in this encounter.   Current Outpatient Medications:    amLODipine (NORVASC) 10 MG tablet, Take 1 tablet (10 mg total) by mouth daily., Disp: 90 tablet, Rfl: 2   aspirin 81 MG tablet, Take 81 mg by mouth daily., Disp: , Rfl:    atenolol (TENORMIN) 100 MG tablet, Take 1 tablet (100 mg total) by mouth daily., Disp: 90 tablet, Rfl: 2   escitalopram (LEXAPRO) 20 MG tablet, Take 1 tablet (20 mg total) by mouth daily., Disp: 90 tablet, Rfl: 2   Omega-3 Fatty Acids (FISH OIL) 1200 MG CAPS, Take 1 capsule by mouth at bedtime. , Disp: , Rfl:    pantoprazole (PROTONIX) 40 MG tablet, Take 1 tablet (40 mg total) by mouth daily., Disp: 90 tablet, Rfl: 2   rosuvastatin (CRESTOR) 20 MG tablet, Take 1 tablet (20 mg total) by mouth daily., Disp: 90 tablet, Rfl: 2   traMADol (ULTRAM) 50 MG tablet, TAKE ONE TABLET BY MOUTH EVERY 6 HOURS AS NEEDED, Disp: 30 tablet, Rfl: 0   zolpidem (AMBIEN) 10 MG tablet, TAKE ONE TABLET BY MOUTH EVERY NIGHT AT BEDTIME AS NEEDED FOR SLEEP, Disp: 30 tablet, Rfl: 3 Medications Discontinued During This Encounter  Medication Reason   doxycycline (VIBRA-TABS) 100 MG tablet Error   meclizine (ANTIVERT) 25 MG tablet Error   tamsulosin (FLOMAX) 0.4 MG CAPS capsule Error    Next Medicare Wellness Visit in  12+ months  This visit was completed via telephone due to the restrictions of the COVID-19 pandemic. All issues as above were discussed and addressed but no physical exam was performed. If it  was felt that the patient should be evaluated in the office, they were directed there. The patient verbally consented to this visit. Patient was unable to complete an audio/visual visit due to Lack of equipment. Location of the patient: home Location of the provider: work Those involved with this call:  Provider: Noemi Chapel, DNP, FNP-C CMA: Mardelle Matte, CMA Front Desk/Registration: Santina Evans  Time spent on call:  9 minutes on the phone discussing health concerns. 19 minutes total spent in review of patient's record and preparation of their chart. I verified patient identity using two factors (patient name and date of birth). Patient consents verbally to being seen via telemedicine visit today.

## 2021-09-10 ENCOUNTER — Ambulatory Visit (INDEPENDENT_AMBULATORY_CARE_PROVIDER_SITE_OTHER): Payer: Medicare HMO | Admitting: Nurse Practitioner

## 2021-09-10 VITALS — Ht 70.0 in | Wt 215.0 lb

## 2021-09-10 DIAGNOSIS — Z0000 Encounter for general adult medical examination without abnormal findings: Secondary | ICD-10-CM | POA: Diagnosis not present

## 2021-09-17 ENCOUNTER — Telehealth: Payer: Self-pay | Admitting: Family Medicine

## 2021-09-17 NOTE — Telephone Encounter (Signed)
Patient called in stating that Curtis Lucas had left him a message. He said that he doesn't want to make an appointment with Dr. Dennard Schaumann in December he has been dealing with his mood and depression all of his life. He is on lexapro and feels that he doesn't need to come in at this time.  CB# [phone]

## 2021-10-03 ENCOUNTER — Other Ambulatory Visit: Payer: Self-pay

## 2021-10-03 ENCOUNTER — Ambulatory Visit (INDEPENDENT_AMBULATORY_CARE_PROVIDER_SITE_OTHER): Payer: Medicare HMO | Admitting: Family Medicine

## 2021-10-03 VITALS — BP 136/82 | HR 65 | Temp 98.3°F | Resp 12 | Wt 206.0 lb

## 2021-10-03 DIAGNOSIS — J069 Acute upper respiratory infection, unspecified: Secondary | ICD-10-CM

## 2021-10-03 MED ORDER — HYDROCODONE BIT-HOMATROP MBR 5-1.5 MG/5ML PO SOLN: 5.0000 mL | Freq: Three times a day (TID) | ORAL | 0 refills | Status: DC | PRN

## 2021-10-03 MED ORDER — TRAMADOL HCL 50 MG PO TABS: 50.0000 mg | ORAL_TABLET | Freq: Four times a day (QID) | ORAL | 0 refills | Status: DC | PRN

## 2021-10-03 NOTE — Progress Notes (Signed)
Subjective:    Patient ID: Curtis Lucas, male    DOB: 1961/11/13, 60 y.o.   MRN: [id number]  Symptoms began last week.  Symptoms started 7 days ago.  He states that he had a cough that was productive of yellow and white sputum.  He states that he was having wheezing.  He has some subjective fevers.  That is gradually improved over the last week.  He denies any chest pain.  He denies any shortness of breath.  He was having some runny nose however that is resolved.  He denies any sinus pain or rhinorrhea today.  He denies any sore throat.  Cough is gradually improving. Past Medical History:  Diagnosis Date   Allergy    Rhinitis   Anxiety    Arthritis    Biceps rupture, distal, right, initial encounter    CVA (cerebral vascular accident) ([place])    Depression    Difficulty sleeping    Elevated lipids    GERD (gastroesophageal reflux disease)    Hyperglycemia    Hyperlipidemia    Hypertension    Insomnia    Past Surgical History:  Procedure Laterality Date   COLONOSCOPY  09/24/2003   patterson--normal per pt.   DISTAL BICEPS TENDON REPAIR Right 02/24/2017   Procedure: RIGHT DISTAL BICEPS TENDON REPAIR;  Surgeon: Leandrew Koyanagi, MD;  Location: [place];  Service: Orthopedics;  Laterality: Right;   HEAD INJURY   1987   JOINT REPLACEMENT     LUMBAR EPIDURAL INJECTION     RECTAL SURGERY  2006   TOTAL HIP ARTHROPLASTY     TOTAL HIP ARTHROPLASTY  05/03/2012   Procedure: TOTAL HIP ARTHROPLASTY ANTERIOR APPROACH;  Surgeon: Mauri Pole, MD;  Location: [facility];  Service: Orthopedics;  Laterality: Right;   Current Outpatient Medications on File Prior to Visit  Medication Sig Dispense Refill   amLODipine (NORVASC) 10 MG tablet Take 1 tablet (10 mg total) by mouth daily. 90 tablet 2   aspirin 81 MG tablet Take 81 mg by mouth daily.     atenolol (TENORMIN) 100 MG tablet Take 1 tablet (100 mg total) by mouth daily. 90 tablet 2   escitalopram (LEXAPRO) 20 MG tablet Take 1  tablet (20 mg total) by mouth daily. 90 tablet 2   Omega-3 Fatty Acids (FISH OIL) 1200 MG CAPS Take 1 capsule by mouth at bedtime.      pantoprazole (PROTONIX) 40 MG tablet Take 1 tablet (40 mg total) by mouth daily. 90 tablet 2   rosuvastatin (CRESTOR) 20 MG tablet Take 1 tablet (20 mg total) by mouth daily. 90 tablet 2   traMADol (ULTRAM) 50 MG tablet TAKE ONE TABLET BY MOUTH EVERY 6 HOURS AS NEEDED 30 tablet 0   zolpidem (AMBIEN) 10 MG tablet TAKE ONE TABLET BY MOUTH EVERY NIGHT AT BEDTIME AS NEEDED FOR SLEEP 30 tablet 3   No current facility-administered medications on file prior to visit.   Allergies  Allergen Reactions   Lipitor [Atorvastatin Calcium]     Severe muscle spasms   Social History   Socioeconomic History   Marital status: Married    Spouse name: Debbie   Number of children: 0   Years of education: HS   Highest education level: Not on file  Occupational History    Employer: [employer]    Comment: Product manager  Tobacco Use   Smoking status: Never   Smokeless tobacco: Never  Substance and Sexual Activity  Alcohol use: Yes    Comment: 4-5 BEERS PER WK   Drug use: Yes    Types: Marijuana   Sexual activity: Yes  Other Topics Concern   Not on file  Social History Narrative   Patient lives at home with spouse.   Caffeine Use:4 16oz Mt. Dew sodas daily   Social Determinants of Health   Financial Resource Strain: Not on file  Food Insecurity: Not on file  Transportation Needs: Not on file  Physical Activity: Not on file  Stress: Not on file  Social Connections: Not on file  Intimate Partner Violence: Not on file      Review of Systems  All other systems reviewed and are negative.     Objective:   Physical Exam Vitals reviewed.  Constitutional:      General: He is not in acute distress.    Appearance: Normal appearance. He is normal weight. He is not ill-appearing or toxic-appearing.  HENT:     Head: Normocephalic and atraumatic.      Right Ear: Tympanic membrane and ear canal normal.     Left Ear: Tympanic membrane and ear canal normal.     Nose: No congestion or rhinorrhea.     Mouth/Throat:     Mouth: Mucous membranes are moist.     Pharynx: Oropharynx is clear. No oropharyngeal exudate.  Eyes:     Conjunctiva/sclera: Conjunctivae normal.  Cardiovascular:     Rate and Rhythm: Normal rate and regular rhythm.     Pulses: Normal pulses.     Heart sounds: Normal heart sounds. No murmur heard.   No friction rub. No gallop.  Pulmonary:     Effort: Pulmonary effort is normal. No respiratory distress.     Breath sounds: Normal breath sounds. No stridor. No wheezing, rhonchi or rales.  Chest:     Chest wall: No tenderness.  Abdominal:     General: Abdomen is flat. Bowel sounds are normal.     Palpations: Abdomen is soft.     Tenderness: There is no abdominal tenderness. There is no guarding.  Musculoskeletal:     Right lower leg: No edema.     Left lower leg: No edema.  Neurological:     Mental Status: He is alert.          Assessment & Plan:  Viral URI with cough At this point, the patient's symptoms are improving.  He states that he is gone through the worst of it and he is feeling much better.  I suspect that he had a viral upper respiratory infection.  I would recommend tincture of time.  Treat with Hycodan 1 teaspoon every 6 hours as needed for cough.  Otherwise anticipate that the patient will gradually feel back to normal over the next 2 to 3 days.  Recheck if worsening.

## 2021-10-28 ENCOUNTER — Encounter: Payer: Self-pay | Admitting: Family Medicine

## 2021-10-28 ENCOUNTER — Ambulatory Visit (HOSPITAL_COMMUNITY)
Admission: RE | Admit: 2021-10-28 | Discharge: 2021-10-28 | Disposition: A | Discharge status: Home / Self Care | Payer: Medicare HMO | Source: Ambulatory Visit | Attending: Family Medicine | Admitting: Family Medicine

## 2021-10-28 ENCOUNTER — Ambulatory Visit (INDEPENDENT_AMBULATORY_CARE_PROVIDER_SITE_OTHER): Payer: Medicare HMO | Admitting: Family Medicine

## 2021-10-28 ENCOUNTER — Other Ambulatory Visit: Payer: Self-pay

## 2021-10-28 VITALS — BP 136/84 | HR 62 | Temp 98.1°F | Resp 16 | Ht 70.0 in | Wt 200.0 lb

## 2021-10-28 DIAGNOSIS — R0602 Shortness of breath: Secondary | ICD-10-CM | POA: Diagnosis not present

## 2021-10-28 DIAGNOSIS — R509 Fever, unspecified: Secondary | ICD-10-CM | POA: Insufficient documentation

## 2021-10-28 DIAGNOSIS — R059 Cough, unspecified: Secondary | ICD-10-CM | POA: Diagnosis not present

## 2021-10-28 MED ORDER — ALBUTEROL SULFATE HFA 108 (90 BASE) MCG/ACT IN AERS: 2.0000 | INHALATION_SPRAY | Freq: Four times a day (QID) | RESPIRATORY_TRACT | 0 refills | Status: AC | PRN

## 2021-10-28 NOTE — Progress Notes (Signed)
Subjective:    Patient ID: Curtis Lucas, male    DOB: [birth year], 60 y.o.   MRN: [id number] 10/03/21 Symptoms began last week.  Symptoms started 7 days ago.  He states that he had a cough that was productive of yellow and white sputum.  He states that he was having wheezing.  He has some subjective fevers.  That is gradually improved over the last week.  He denies any chest pain.  He denies any shortness of breath.  He was having some runny nose however that is resolved.  He denies any sinus pain or rhinorrhea today.  He denies any sore throat.  Cough is gradually improving.  At that time, my plan was: At this point, the patient's symptoms are improving.  He states that he is gone through the worst of it and he is feeling much better.  I suspect that he had a viral upper respiratory infection.  I would recommend tincture of time.  Treat with Hycodan 1 teaspoon every 6 hours as needed for cough.  Otherwise anticipate that the patient will gradually feel back to normal over the next 2 to 3 days.  Recheck if worsening.  10/28/21 Patient states that Saturday he was sitting at home.  Suddenly he became extremely cold.  He had shaking chills.  Then he became extremely hot like he was running a fever.  He is also developed a cough productive of yellow mucus and he reports that he is wheezing.  However today on exam, his lungs sound relatively clear.  There is some faint expiratory wheezing.  There is no crackles or rails.  He denies any pleurisy or hemoptysis.  His oxygen saturation is 94% on room air.  There is no swelling in his legs.  There is no evidence of fluid overload Past Medical History:  Diagnosis Date   Allergy    Rhinitis   Anxiety    Arthritis    Biceps rupture, distal, right, initial encounter    CVA (cerebral vascular accident) ([place])    Depression    Difficulty sleeping    Elevated lipids    GERD (gastroesophageal reflux disease)    Hyperglycemia    Hyperlipidemia     Hypertension    Insomnia    Past Surgical History:  Procedure Laterality Date   COLONOSCOPY  09/24/2003   patterson--normal per pt.   DISTAL BICEPS TENDON REPAIR Right 02/24/2017   Procedure: RIGHT DISTAL BICEPS TENDON REPAIR;  Surgeon: Leandrew Koyanagi, MD;  Location: [facility];  Service: Orthopedics;  Laterality: Right;   HEAD INJURY   1987   JOINT REPLACEMENT     LUMBAR EPIDURAL INJECTION     RECTAL SURGERY  2006   TOTAL HIP ARTHROPLASTY     TOTAL HIP ARTHROPLASTY  05/03/2012   Procedure: TOTAL HIP ARTHROPLASTY ANTERIOR APPROACH;  Surgeon: Mauri Pole, MD;  [facility];  Service: Orthopedics;  Laterality: Right;   Current Outpatient Medications on File Prior to Visit  Medication Sig Dispense Refill   amLODipine (NORVASC) 10 MG tablet Take 1 tablet (10 mg total) by mouth daily. 90 tablet 2   aspirin 81 MG tablet Take 81 mg by mouth daily.     atenolol (TENORMIN) 100 MG tablet Take 1 tablet (100 mg total) by mouth daily. 90 tablet 2   escitalopram (LEXAPRO) 20 MG tablet Take 1 tablet (20 mg total) by mouth daily. 90 tablet 2   Omega-3 Fatty Acids (FISH OIL) 1200 MG CAPS  Take 1 capsule by mouth at bedtime.      pantoprazole (PROTONIX) 40 MG tablet Take 1 tablet (40 mg total) by mouth daily. 90 tablet 2   rosuvastatin (CRESTOR) 20 MG tablet Take 1 tablet (20 mg total) by mouth daily. 90 tablet 2   traMADol (ULTRAM) 50 MG tablet Take 1 tablet (50 mg total) by mouth every 6 (six) hours as needed. 30 tablet 0   zolpidem (AMBIEN) 10 MG tablet TAKE ONE TABLET BY MOUTH EVERY NIGHT AT BEDTIME AS NEEDED FOR SLEEP 30 tablet 3   No current facility-administered medications on file prior to visit.   Allergies  Allergen Reactions   Lipitor [Atorvastatin Calcium]     Severe muscle spasms   Social History   Socioeconomic History   Marital status: Married    Spouse name: Debbie   Number of children: 0   Years of education: HS   Highest education level: Not on file   Occupational History    Employer: [employer]    Comment: Product manager  Tobacco Use   Smoking status: Never   Smokeless tobacco: Never  Substance and Sexual Activity   Alcohol use: Yes    Comment: 4-5 BEERS PER WK   Drug use: Yes    Types: Marijuana   Sexual activity: Yes  Other Topics Concern   Not on file  Social History Narrative   Patient lives at home with spouse.   Caffeine Use:4 16oz Mt. Dew sodas daily   Social Determinants of Health   Financial Resource Strain: Not on file  Food Insecurity: Not on file  Transportation Needs: Not on file  Physical Activity: Not on file  Stress: Not on file  Social Connections: Not on file  Intimate Partner Violence: Not on file      Review of Systems  All other systems reviewed and are negative.     Objective:   Physical Exam Vitals reviewed.  Constitutional:      General: He is not in acute distress.    Appearance: Normal appearance. He is normal weight. He is not ill-appearing or toxic-appearing.  HENT:     Head: Normocephalic and atraumatic.     Right Ear: Tympanic membrane and ear canal normal.     Left Ear: Tympanic membrane and ear canal normal.     Nose: No congestion or rhinorrhea.     Mouth/Throat:     Mouth: Mucous membranes are moist.     Pharynx: Oropharynx is clear. No oropharyngeal exudate.  Eyes:     Conjunctiva/sclera: Conjunctivae normal.  Cardiovascular:     Rate and Rhythm: Normal rate and regular rhythm.     Pulses: Normal pulses.     Heart sounds: Normal heart sounds. No murmur heard.   No friction rub. No gallop.  Pulmonary:     Effort: Pulmonary effort is normal. No respiratory distress.     Breath sounds: Normal breath sounds. No stridor. No wheezing, rhonchi or rales.  Chest:     Chest wall: No tenderness.  Abdominal:     General: Abdomen is flat. Bowel sounds are normal.     Palpations: Abdomen is soft.     Tenderness: There is no abdominal tenderness. There is no  guarding.  Musculoskeletal:     Right lower leg: No edema.     Left lower leg: No edema.  Neurological:     Mental Status: He is alert.          Assessment & Plan:  Fever, unspecified fever cause - Plan: CBC with Differential/Platelet, D-dimer, quantitative, DG Chest 2 View Exam today is relatively normal.  There are a few possibilities.  Either the patient has caught a second virus in short succession, he has developed a secondary pneumonia after his initial virus, he is having reactive airway disease to the initial virus, or less likely would be a blood clot.  I will go ahead and call out albuterol 2 puffs inhaled every 4-6 hours for wheezing.  I will get a chest x-ray and a CBC to rule out evidence of pneumonia.  I will check a D-dimer to rule out a blood clot after her viral infection such as COVID, if labs and x-ray are normal, I will treat the patient for reactive airway disease with a prednisone taper pack and albuterol.  Obviously if there is pneumonia on the x-ray I will treat him with antibiotics.  If the D-dimer is elevated we will proceed to a CT scan of the chest.

## 2021-10-29 ENCOUNTER — Other Ambulatory Visit: Payer: Self-pay | Admitting: Family Medicine

## 2021-10-29 LAB — CBC WITH DIFFERENTIAL/PLATELET
Absolute Monocytes: 806 cells/uL (ref 200–950)
Basophils Absolute: 42 cells/uL (ref 0–200)
Basophils Relative: 0.5 %
Eosinophils Absolute: 134 cells/uL (ref 15–500)
Eosinophils Relative: 1.6 %
HCT: 44.7 % (ref 38.5–50.0)
Hemoglobin: 15.2 g/dL (ref 13.2–17.1)
Lymphs Abs: 2369 cells/uL (ref 850–3900)
MCH: 30.8 pg (ref 27.0–33.0)
MCHC: 34 g/dL (ref 32.0–36.0)
MCV: 90.7 fL (ref 80.0–100.0)
MPV: 10.6 fL (ref 7.5–12.5)
Monocytes Relative: 9.6 %
Neutro Abs: 5048 cells/uL (ref 1500–7800)
Neutrophils Relative %: 60.1 %
Platelets: 339 10*3/uL (ref 140–400)
RBC: 4.93 10*6/uL (ref 4.20–5.80)
RDW: 11.4 % (ref 11.0–15.0)
Total Lymphocyte: 28.2 %
WBC: 8.4 10*3/uL (ref 3.8–10.8)

## 2021-10-29 LAB — D-DIMER, QUANTITATIVE: D-Dimer, Quant: 0.36 mcg/mL FEU (ref <0.50)

## 2021-10-29 NOTE — Telephone Encounter (Signed)
LOV 09/10/21 Last refill 07/03/21, #30, 3 refills  Please review. Thanks!

## 2021-10-30 ENCOUNTER — Other Ambulatory Visit: Payer: Self-pay | Admitting: Family Medicine

## 2021-10-30 MED ORDER — PREDNISONE 20 MG PO TABS: ORAL_TABLET | ORAL | 0 refills | Status: DC

## 2021-11-03 ENCOUNTER — Telehealth: Payer: Self-pay | Admitting: *Deleted

## 2021-11-03 NOTE — Telephone Encounter (Signed)
Received call from patient.   Reports that he has completed Prednisone taper, but cough remains unchanged.   Inquired as to next steps.   CXR and labs WNL. Treating for possible reactive airway disease.   Please advise.

## 2021-11-04 ENCOUNTER — Other Ambulatory Visit: Payer: Self-pay | Admitting: Family Medicine

## 2021-11-04 MED ORDER — HYDROCODONE BIT-HOMATROP MBR 5-1.5 MG/5ML PO SOLN: 5.0000 mL | Freq: Three times a day (TID) | ORAL | 0 refills | Status: DC | PRN

## 2021-11-04 NOTE — Telephone Encounter (Signed)
Call placed to patient and patient made aware.  

## 2021-11-12 ENCOUNTER — Other Ambulatory Visit: Payer: Self-pay

## 2021-11-12 MED ORDER — AMLODIPINE BESYLATE 10 MG PO TABS: 10.0000 mg | ORAL_TABLET | Freq: Every day | ORAL | 2 refills | Status: DC

## 2021-11-12 MED ORDER — TRAMADOL HCL 50 MG PO TABS
50.0000 mg | ORAL_TABLET | Freq: Four times a day (QID) | ORAL | 0 refills | Status: DC | PRN
Start: 2021-11-12 — End: 2021-11-19

## 2021-11-12 NOTE — Progress Notes (Signed)
LOV 10/28/21 Last refill 10/03/21, #30, 0 refills  Please review, thanks!

## 2021-11-12 NOTE — Progress Notes (Signed)
PDMP reviewed.  Appears that patient takes this medication chronically.  Refill given in place of PCP who is out of the office.

## 2021-11-19 ENCOUNTER — Other Ambulatory Visit: Payer: Self-pay | Admitting: *Deleted

## 2021-11-19 MED ORDER — AMLODIPINE BESYLATE 10 MG PO TABS: 10.0000 mg | ORAL_TABLET | Freq: Every day | ORAL | 2 refills | Status: DC

## 2021-11-20 MED ORDER — TRAMADOL HCL 50 MG PO TABS: 50.0000 mg | ORAL_TABLET | Freq: Four times a day (QID) | ORAL | 0 refills | Status: DC | PRN

## 2021-11-26 ENCOUNTER — Telehealth: Payer: Self-pay | Admitting: Pharmacist

## 2021-11-26 NOTE — Progress Notes (Signed)
Chronic Care Management Pharmacy Assistant   Name: Curtis Lucas  MRN: [id number] DOB: 14-Oct-1961   Reason for Encounter: Disease State - Hypertension Call     Recent office visits:  10/28/21 Jenna Luo, MD (PCP) - Family Medicine - Fever - labs were ordered. albuterol (VENTOLIN HFA) 108 (90 Base) MCG/ACT inhaler Inhale 2 puffs into the lungs every 6 (six) hours as needed for wheezing or shortness of breath prescribed. I will check a D-dimer to rule out a blood clot after her viral infection such as COVID, if labs and x-ray are normal, I will treat the patient for reactive airway disease with a prednisone taper pack and albuterol.  Obviously if there is pneumonia on the x-ray I will treat him with antibiotics.  If the D-dimer is elevated we will proceed to a CT scan of the chest.Follow up as needed.   10/03/21 Jenna Luo, MD (PCP) - Family Medicine - Viral URI - HYDROcodone bit-homatropine (HYCODAN) 5-1.5 MG/5ML syrup Take 5 mLs by mouth every 8 (eight) hours as needed for cough prescribed. Follow up if no improvement.  09/10/21 Jenna Luo, MD (PCP) - Family Medicine - Routine Exam - No notes available.   Recent consult visits:  None noted.   Hospital visits:  None in previous 6 months  Medications: Outpatient Encounter Medications as of 11/26/2021  Medication Sig   albuterol (VENTOLIN HFA) 108 (90 Base) MCG/ACT inhaler Inhale 2 puffs into the lungs every 6 (six) hours as needed for wheezing or shortness of breath.   amLODipine (NORVASC) 10 MG tablet Take 1 tablet (10 mg total) by mouth daily.   aspirin 81 MG tablet Take 81 mg by mouth daily.   atenolol (TENORMIN) 100 MG tablet Take 1 tablet (100 mg total) by mouth daily.   escitalopram (LEXAPRO) 20 MG tablet Take 1 tablet (20 mg total) by mouth daily.   Omega-3 Fatty Acids (FISH OIL) 1200 MG CAPS Take 1 capsule by mouth at bedtime.    pantoprazole (PROTONIX) 40 MG tablet Take 1 tablet (40 mg total) by mouth daily.    predniSONE (DELTASONE) 20 MG tablet 3 tabs poqday 1-2, 2 tabs poqday 3-4, 1 tab poqday 5-6   rosuvastatin (CRESTOR) 20 MG tablet Take 1 tablet (20 mg total) by mouth daily.   traMADol (ULTRAM) 50 MG tablet Take 1 tablet (50 mg total) by mouth every 6 (six) hours as needed.   zolpidem (AMBIEN) 10 MG tablet TAKE ONE TABLET BY MOUTH EVERY NIGHT AT BEDTIME AS NEEDED FOR SLEEP   No facility-administered encounter medications on file as of 11/26/2021.    Current antihypertensive regimen:  Amlodipine 10 mg daily Atenolol 100 mg daily  How often are you checking your Blood Pressure?  Patient reported checking blood pressures if he is out at [employer] but not at home regularly. He stated they have been good and he has had no concerns.    Current home BP readings: 135/87 (pt reported last week at [facility])   What recent interventions/DTPs have been made by any provider to improve Blood Pressure control since last CPP Visit:  Patient reported no changes to his current regimen.    Any recent hospitalizations or ED visits since last visit with CPP?  Patient has not had any ED visits or hospitalizations since last visit with CPP.   What diet changes have been made to improve Blood Pressure Control?  Patient reports he does not like salt and does not use it.   What exercise  is being done to improve your Blood Pressure Control?  Patient reported he has not been as active as he should due to illnesses. He reported he has recovered from recent URIs and plans to get COVID booster soon.     Adherence Review: Is the patient currently on ACE/ARB medication? Yes Does the patient have >5 day gap between last estimated fill dates? No  Amlodipine 10 mg daily - last filled 08/07/21 90 days  Atenolol 100 mg daily - last filled 08/27/21 90 days    Care Gaps  AWV: done 09/10/21 Colonoscopy: due 06/28/22 DM Eye Exam:  N/A DM Foot Exam: N/A Microalbumin: N/A HbgAIC: done 04/21/21 (5.7) DEXA:  N/A Mammogram: N/A   Star Rating Drugs: Rosuvastatin 20 mg - last filled 08/07/21 90 days    No future appointments.  Jobe Gibbon, [place] Pharmacist Assistant  [phone]  Time Spent: 29 minutes

## 2022-01-21 ENCOUNTER — Other Ambulatory Visit: Payer: Self-pay

## 2022-01-21 MED ORDER — PANTOPRAZOLE SODIUM 40 MG PO TBEC: 40.0000 mg | DELAYED_RELEASE_TABLET | Freq: Every day | ORAL | 2 refills | Status: DC

## 2022-01-23 ENCOUNTER — Other Ambulatory Visit: Payer: Self-pay | Admitting: Nurse Practitioner

## 2022-01-23 NOTE — Telephone Encounter (Signed)
LOV 10/28/21 Last refill 11/20/21, #30, 0 refills  Please review, thanks!

## 2022-02-20 ENCOUNTER — Other Ambulatory Visit: Payer: Self-pay | Admitting: Family Medicine

## 2022-02-20 NOTE — Telephone Encounter (Signed)
Last  filled on 01/22/22  ? Last OV  10/22 ? ?Is this okay to refill ? ?

## 2022-02-24 ENCOUNTER — Other Ambulatory Visit: Payer: Self-pay | Admitting: Family Medicine

## 2022-02-24 DIAGNOSIS — F411 Generalized anxiety disorder: Secondary | ICD-10-CM

## 2022-02-24 NOTE — Telephone Encounter (Signed)
LOV 10/28/21 ?Last refill 01/23/21, #30, 0 refills ? ?Please review, thanks! ?

## 2022-02-24 NOTE — Telephone Encounter (Signed)
Patient called to also called to request refills of ? ?traMADol (ULTRAM) 50 MG tablet ? ?pantoprazole (PROTONIX) 40 MG tablet ? ? ? ?Pharmacy confirmed as ? ?[facility] [id number]  ?[place], [address] [place] [address]  ?Phone:  [phone]  Fax:  [phone]  ? ?Please advise at [phone] ?

## 2022-02-25 MED ORDER — TRAMADOL HCL 50 MG PO TABS: 50.0000 mg | ORAL_TABLET | Freq: Four times a day (QID) | ORAL | 0 refills | Status: DC | PRN

## 2022-02-25 NOTE — Telephone Encounter (Signed)
Rx for Tramadol sent yesterday. ? ?Rx for Pantoprazole sent 01/21/22, #90, 2 refills. This rx is not due for refills at this time.  ?

## 2022-03-27 ENCOUNTER — Other Ambulatory Visit: Payer: Self-pay | Admitting: Family Medicine

## 2022-03-30 NOTE — Telephone Encounter (Signed)
LOV 10/28/21 ?Last refill 02/25/22, #30, 0 refills ? ?Please review, thanks! ? ?

## 2022-04-07 ENCOUNTER — Other Ambulatory Visit: Payer: Self-pay | Admitting: Family Medicine

## 2022-04-07 NOTE — Telephone Encounter (Signed)
LOV 10/28/21 ?Last refill 02/25/22, #30, 0 refills ? ?Please review, thanks! ? ?

## 2022-05-19 ENCOUNTER — Other Ambulatory Visit: Payer: Self-pay | Admitting: Family Medicine

## 2022-05-19 NOTE — Telephone Encounter (Signed)
Is this okay to refill ?  Last filled  04/07/22  Last ov 10/03/21  Next OV  none

## 2022-05-22 ENCOUNTER — Other Ambulatory Visit: Payer: Self-pay | Admitting: Family Medicine

## 2022-05-22 ENCOUNTER — Telehealth: Payer: Self-pay | Admitting: *Deleted

## 2022-05-22 NOTE — Telephone Encounter (Signed)
Called pt and scheduled him for yearly physical.

## 2022-05-22 NOTE — Telephone Encounter (Signed)
Requested Prescriptions  Pending Prescriptions Disp Refills  . rosuvastatin (CRESTOR) 20 MG tablet [Pharmacy Med Name: ROSUVASTATIN CALCIUM 20 MG TAB] 90 tablet 2    Sig: TAKE ONE TABLET BY MOUTH DAILY     Cardiovascular:  Antilipid - Statins 2 Failed - 05/22/2022  6:21 AM      Failed - Cr in normal range and within 360 days    Creat  Date Value Ref Range Status  04/21/2021 0.75 0.70 - 1.25 mg/dL Final    Comment:    For patients >63 years of age, the reference limit for Creatinine is approximately 13% higher for people identified as African-American. .          Failed - Lipid Panel in normal range within the last 12 months    Cholesterol  Date Value Ref Range Status  04/21/2021 171 <200 mg/dL Final   LDL Cholesterol (Calc)  Date Value Ref Range Status  04/21/2021 92 mg/dL (calc) Final    Comment:    Reference range: <100 . Desirable range <100 mg/dL for primary prevention;   <70 mg/dL for patients with CHD or diabetic patients  with > or = 2 CHD risk factors. [name] LDL-C is now calculated using the Martin-Hopkins  calculation, which is a validated novel method providing  better accuracy than the Friedewald equation in the  estimation of LDL-C.  Cresenciano Genre et al. Annamaria Helling. 0539;767(34): 2061-2068  (http://education.Quest[employer].com/faq/FAQ164)    HDL  Date Value Ref Range Status  04/21/2021 47 > OR = 40 mg/dL Final   Triglycerides  Date Value Ref Range Status  04/21/2021 228 (H) <150 mg/dL Final    Comment:    . If a non-fasting specimen was collected, consider repeat triglyceride testing on a fasting specimen if clinically indicated.  Yates Decamp et al. J. of Clin. Lipidol. 1937;9:024-097. [name]          Passed - Patient is not pregnant      Passed - Valid encounter within last 12 months    Recent Outpatient Visits          6 months ago Fever, unspecified fever cause   [place] Pickard, Cammie Mcgee, MD   7 months ago Viral URI with cough   [place] Dennard Schaumann, Cammie Mcgee, MD   1 year ago Screening cholesterol level   [place] Dennard Schaumann, Cammie Mcgee, MD   1 year ago Tick bite, initial encounter   [place], [place], [place]   1 year ago Right upper quadrant abdominal pain   [place], [place], [place]      Future Appointments            In 1 week Pickard, Cammie Mcgee, MD [place]

## 2022-05-28 ENCOUNTER — Other Ambulatory Visit: Payer: Self-pay | Admitting: Family Medicine

## 2022-06-04 ENCOUNTER — Ambulatory Visit (INDEPENDENT_AMBULATORY_CARE_PROVIDER_SITE_OTHER): Payer: Medicare HMO | Admitting: Family Medicine

## 2022-06-04 ENCOUNTER — Encounter: Payer: Self-pay | Admitting: Family Medicine

## 2022-06-04 VITALS — BP 130/76 | HR 66 | Temp 97.8°F | Ht 70.0 in | Wt 192.0 lb

## 2022-06-04 DIAGNOSIS — Z0000 Encounter for general adult medical examination without abnormal findings: Secondary | ICD-10-CM

## 2022-06-04 DIAGNOSIS — Z1211 Encounter for screening for malignant neoplasm of colon: Secondary | ICD-10-CM | POA: Diagnosis not present

## 2022-06-04 DIAGNOSIS — Z125 Encounter for screening for malignant neoplasm of prostate: Secondary | ICD-10-CM | POA: Diagnosis not present

## 2022-06-04 DIAGNOSIS — Z13220 Encounter for screening for lipoid disorders: Secondary | ICD-10-CM | POA: Diagnosis not present

## 2022-06-04 NOTE — Progress Notes (Signed)
Subjective:    Patient ID: Curtis Lucas, male    DOB: 05/20/1961, 61 y.o.   MRN: [id number]  Medication Refill   Patient is here today for a CPE.  Patient has remote history of a stroke.  This was due to a traumatic accident.  Patient had a metal rod pierced his left orbit, and impinge upon the left internal carotid artery.  This required emergency surgery.  This was performed at [facility].  He also has a history of a ruptured right biceps requiring surgical correction.  However the patient suffers chronic pain in his right antecubital fossa ever since.  He has to take tramadol 1-2 times a day due to the persistent pain in his right elbow and right bicep.  He has diminished strength in his arm and is unable to use it due to easy fatigability.  Overall, the patient states he is doing well.  He is due for a flu shot in the fall.  He is due for the shingles vaccine.  We discussed both of these.  He defers them at the present time.  His last colonoscopy was in 2020.  He has a history of colon polyps.  His mother also died from colon cancer.  Therefore he is due for repeat colonoscopy this year.  I placed an order to his gastroenterologist to try to get this scheduled for him.  He is due for prostate cancer screening.  Otherwise his preventative care will then be up-to-date.  His blood pressure today is outstanding at 130/76. Past Medical History:  Diagnosis Date   Allergy    Rhinitis   Anxiety    Arthritis    Biceps rupture, distal, right, initial encounter    CVA (cerebral vascular accident) ([place])    Depression    Difficulty sleeping    Elevated lipids    GERD (gastroesophageal reflux disease)    Hyperglycemia    Hyperlipidemia    Hypertension    Insomnia    Past Surgical History:  Procedure Laterality Date   COLONOSCOPY  09/24/2003   patterson--normal per pt.   DISTAL BICEPS TENDON REPAIR Right 02/24/2017   Procedure: RIGHT DISTAL BICEPS TENDON REPAIR;  Surgeon: Leandrew Koyanagi,  MD;  Location: [facility];  Service: Orthopedics;  Laterality: Right;   HEAD INJURY   1987   JOINT REPLACEMENT     LUMBAR EPIDURAL INJECTION     RECTAL SURGERY  2006   TOTAL HIP ARTHROPLASTY     TOTAL HIP ARTHROPLASTY  05/03/2012   Procedure: TOTAL HIP ARTHROPLASTY ANTERIOR APPROACH;  Surgeon: Mauri Pole, MD;  Location: [facility];  Service: Orthopedics;  Laterality: Right;   Current Outpatient Medications on File Prior to Visit  Medication Sig Dispense Refill   albuterol (VENTOLIN HFA) 108 (90 Base) MCG/ACT inhaler Inhale 2 puffs into the lungs every 6 (six) hours as needed for wheezing or shortness of breath. 8 g 0   amLODipine (NORVASC) 10 MG tablet Take 1 tablet (10 mg total) by mouth daily. 90 tablet 2   aspirin 81 MG tablet Take 81 mg by mouth daily.     atenolol (TENORMIN) 100 MG tablet TAKE ONE TABLET BY MOUTH DAILY 90 tablet 2   escitalopram (LEXAPRO) 20 MG tablet TAKE ONE TABLET BY MOUTH DAILY 90 tablet 2   Omega-3 Fatty Acids (FISH OIL) 1200 MG CAPS Take 1 capsule by mouth at bedtime.      pantoprazole (PROTONIX) 40 MG tablet Take 1 tablet (40  mg total) by mouth daily. 90 tablet 2   rosuvastatin (CRESTOR) 20 MG tablet TAKE ONE TABLET BY MOUTH DAILY 90 tablet 0   traMADol (ULTRAM) 50 MG tablet TAKE ONE TABLET BY MOUTH EVERY 6 HOURS AS NEEDED 30 tablet 0   zolpidem (AMBIEN) 10 MG tablet TAKE ONE TABLET BY MOUTH EVERY NIGHT AT BEDTIME AS NEEDED FOR SLEEP 30 tablet 3   No current facility-administered medications on file prior to visit.     Allergies  Allergen Reactions   Lipitor [Atorvastatin Calcium]     Severe muscle spasms   Social History   Socioeconomic History   Marital status: Married    Spouse name: Debbie   Number of children: 0   Years of education: HS   Highest education level: Not on file  Occupational History    Employer: [employer]    Comment: Product manager  Tobacco Use   Smoking status: Never   Smokeless tobacco: Never   Substance and Sexual Activity   Alcohol use: Yes    Comment: 4-5 BEERS PER WK   Drug use: Yes    Types: Marijuana   Sexual activity: Yes  Other Topics Concern   Not on file  Social History Narrative   Patient lives at home with spouse.   Caffeine Use:4 16oz Mt. Dew sodas daily   Social Determinants of Health   Financial Resource Strain: Low Risk  (05/08/2020)   Overall Financial Resource Strain (CARDIA)    Difficulty of Paying Living Expenses: Not very hard  Food Insecurity: Not on file  Transportation Needs: Not on file  Physical Activity: Not on file  Stress: Not on file  Social Connections: Not on file  Intimate Partner Violence: Not on file      Review of Systems  All other systems reviewed and are negative.      Objective:   Physical Exam Vitals reviewed.  Constitutional:      General: He is not in acute distress.    Appearance: Normal appearance. He is normal weight. He is not ill-appearing, toxic-appearing or diaphoretic.  HENT:     Head: Normocephalic and atraumatic.     Right Ear: Tympanic membrane and ear canal normal.     Left Ear: Tympanic membrane normal.     Nose: Nose normal.     Mouth/Throat:     Mouth: Mucous membranes are moist.     Pharynx: Oropharynx is clear. No oropharyngeal exudate.  Eyes:     Conjunctiva/sclera: Conjunctivae normal.     Pupils: Pupils are equal, round, and reactive to light.  Neck:     Vascular: No carotid bruit.  Cardiovascular:     Rate and Rhythm: Normal rate and regular rhythm.     Pulses: Normal pulses.     Heart sounds: Normal heart sounds. No murmur heard.    No friction rub. No gallop.  Pulmonary:     Effort: Pulmonary effort is normal. No respiratory distress.     Breath sounds: Normal breath sounds. No stridor. No wheezing, rhonchi or rales.  Chest:     Chest wall: No tenderness.  Abdominal:     General: Abdomen is flat. Bowel sounds are normal. There is no distension.     Palpations: Abdomen is soft.  There is no mass.     Tenderness: There is no abdominal tenderness. There is no guarding or rebound.     Hernia: No hernia is present.  Musculoskeletal:        General:  Deformity and signs of injury present.     Cervical back: Normal range of motion and neck supple.     Right lower leg: No edema.     Left lower leg: No edema.  Lymphadenopathy:     Cervical: No cervical adenopathy.  Skin:    Coloration: Skin is not jaundiced.     Findings: No bruising, erythema, lesion or rash.  Neurological:     General: No focal deficit present.     Mental Status: He is alert and oriented to person, place, and time. Mental status is at baseline.     Cranial Nerves: No cranial nerve deficit.     Motor: No weakness.     Gait: Gait normal.  Psychiatric:        Mood and Affect: Mood normal.        Behavior: Behavior normal.        Thought Content: Thought content normal.        Judgment: Judgment normal.     Patient has a scar in his antecubital fossa on the right side from where he ripped his bicep tendon.  He has chronic nerve damage in that area.  He states that it aches and throbs constantly.  Feels like it is asleep constantly trying to wake up.  He also reports weakness in the arm there.      Assessment & Plan:  Prostate cancer screening - Plan: PSA  Screening cholesterol level - Plan: CBC with Differential/Platelet, COMPLETE METABOLIC PANEL WITH GFR, Lipid panel  Colon cancer screening - Plan: Ambulatory referral to Gastroenterology  Encounter for annual wellness visit (AWV) in Medicare patient I will schedule the patient for colonoscopy.  I will screen for prostate cancer with PSA.  I will check a CBC CMP and a fasting lipid panel.  Ideally I like his LDL cholesterol to be below 100.  I recommended a flu shot in the fall.  I recommended the shingles vaccine at his earliest convenience.  The remainder of his preventative care is up-to-date.

## 2022-06-05 LAB — LIPID PANEL
Cholesterol: 154 mg/dL (ref <200)
HDL: 54 mg/dL (ref >=40)
LDL Cholesterol (Calc): 81 mg/dL (calc)
Non-HDL Cholesterol (Calc): 100 mg/dL (calc) (ref <130)
Total CHOL/HDL Ratio: 2.9 (calc) (ref <5.0)
Triglycerides: 99 mg/dL (ref <150)

## 2022-06-05 LAB — CBC WITH DIFFERENTIAL/PLATELET
Absolute Monocytes: 504 cells/uL (ref 200–950)
Basophils Absolute: 41 cells/uL (ref 0–200)
Basophils Relative: 0.6 %
Eosinophils Absolute: 90 cells/uL (ref 15–500)
Eosinophils Relative: 1.3 %
HCT: 43.7 % (ref 38.5–50.0)
Hemoglobin: 15.1 g/dL (ref 13.2–17.1)
Lymphs Abs: 1559 cells/uL (ref 850–3900)
MCH: 31 pg (ref 27.0–33.0)
MCHC: 34.6 g/dL (ref 32.0–36.0)
MCV: 89.7 fL (ref 80.0–100.0)
MPV: 10.9 fL (ref 7.5–12.5)
Monocytes Relative: 7.3 %
Neutro Abs: 4706 cells/uL (ref 1500–7800)
Neutrophils Relative %: 68.2 %
Platelets: 285 10*3/uL (ref 140–400)
RBC: 4.87 10*6/uL (ref 4.20–5.80)
RDW: 12 % (ref 11.0–15.0)
Total Lymphocyte: 22.6 %
WBC: 6.9 10*3/uL (ref 3.8–10.8)

## 2022-06-05 LAB — COMPLETE METABOLIC PANEL WITH GFR
AG Ratio: 1.2 (calc) (ref 1.0–2.5)
ALT: 22 U/L (ref 9–46)
AST: 17 U/L (ref 10–35)
Albumin: 3.9 g/dL (ref 3.6–5.1)
Alkaline phosphatase (APISO): 80 U/L (ref 35–144)
BUN: 14 mg/dL (ref 7–25)
CO2: 24 mmol/L (ref 20–32)
Calcium: 9.1 mg/dL (ref 8.6–10.3)
Chloride: 105 mmol/L (ref 98–110)
Creat: 0.75 mg/dL (ref 0.70–1.35)
Globulin: 3.2 g/dL (calc) (ref 1.9–3.7)
Glucose, Bld: 104 mg/dL — ABNORMAL HIGH (ref 65–99)
Potassium: 4.9 mmol/L (ref 3.5–5.3)
Sodium: 140 mmol/L (ref 135–146)
Total Bilirubin: 0.4 mg/dL (ref 0.2–1.2)
Total Protein: 7.1 g/dL (ref 6.1–8.1)
eGFR: 102 mL/min/{1.73_m2} (ref >=60)

## 2022-06-05 LAB — PSA: PSA: 0.53 ng/mL

## 2022-06-18 ENCOUNTER — Other Ambulatory Visit: Payer: Self-pay | Admitting: Family Medicine

## 2022-06-18 NOTE — Telephone Encounter (Signed)
Requested medication (s) are due for refill today: Yes  Requested medication (s) are on the active medication list: Yes  Last refill:  05/19/22  Future visit scheduled: No  Notes to clinic:  See request.    Requested Prescriptions  Pending Prescriptions Disp Refills   traMADol (ULTRAM) 50 MG tablet [Pharmacy Med Name: traMADol HCL '50MG'$  TABLET] 30 tablet     Sig: TAKE ONE TABLET BY MOUTH EVERY 6 HOURS AS NEEDED     Not Delegated - Analgesics:  Opioid Agonists Failed - 06/18/2022  1:56 PM      Failed - This refill cannot be delegated      Failed - Urine Drug Screen completed in last 360 days      Failed - Valid encounter within last 3 months    Recent Outpatient Visits           7 months ago Fever, unspecified fever cause   [place] Susy Frizzle, MD   8 months ago Viral URI with cough   [place] Dennard Schaumann, Cammie Mcgee, MD   1 year ago Screening cholesterol level   [place] Dennard Schaumann, Cammie Mcgee, MD   1 year ago Tick bite, initial encounter   [place], [place], FNP   2 years ago Right upper quadrant abdominal pain   [place], [place], FNP

## 2022-06-29 ENCOUNTER — Telehealth: Payer: Self-pay

## 2022-06-29 ENCOUNTER — Other Ambulatory Visit: Payer: Self-pay | Admitting: Family Medicine

## 2022-06-29 MED ORDER — TRAMADOL HCL 50 MG PO TABS: 50.0000 mg | ORAL_TABLET | Freq: Four times a day (QID) | ORAL | 0 refills | Status: DC | PRN

## 2022-06-29 NOTE — Telephone Encounter (Signed)
Pt's spouse called in to inquire about this refill of traMADol (ULTRAM) 50 MG tablet [Pharmacy Med Name: traMADol HCL '50MG'$  TABLET] [[id number]]. Please advise.  Cb#: [phone]

## 2022-06-29 NOTE — Telephone Encounter (Signed)
LOV- 06/04/2022

## 2022-06-29 NOTE — Telephone Encounter (Signed)
Already routed refill request on 06/18/22, see encounter. Will route this to provider to follow up on refill.

## 2022-07-28 ENCOUNTER — Other Ambulatory Visit: Payer: Self-pay

## 2022-07-28 MED ORDER — TRAMADOL HCL 50 MG PO TABS: 50.0000 mg | ORAL_TABLET | Freq: Four times a day (QID) | ORAL | 0 refills | Status: DC | PRN

## 2022-07-28 NOTE — Telephone Encounter (Signed)
Pharmacy faxed a refill request for traMADol (ULTRAM) 50 MG tablet [[id number]]    Order Details Dose: 50 mg Route: Oral Frequency: Every 6 hours PRN  Dispense Quantity: 30 tablet Refills: 0        Sig: Take 1 tablet (50 mg total) by mouth every 6 (six) hours as needed.       Start Date: 06/29/22 End Date: --  Written Date: 06/29/22 Expiration Date: 12/26/22     LOV:  10/28/21

## 2022-08-03 ENCOUNTER — Other Ambulatory Visit: Payer: Self-pay | Admitting: Family Medicine

## 2022-08-03 ENCOUNTER — Encounter: Payer: Self-pay | Admitting: Gastroenterology

## 2022-08-23 ENCOUNTER — Other Ambulatory Visit: Payer: Self-pay | Admitting: Family Medicine

## 2022-08-25 ENCOUNTER — Other Ambulatory Visit: Payer: Self-pay

## 2022-08-25 MED ORDER — TRAMADOL HCL 50 MG PO TABS: 50.0000 mg | ORAL_TABLET | Freq: Four times a day (QID) | ORAL | 0 refills | Status: DC | PRN

## 2022-08-25 NOTE — Telephone Encounter (Signed)
Pls advice  

## 2022-08-25 NOTE — Telephone Encounter (Signed)
LOV 06/04/22 Last refill 07/28/22, #30, 0 refills  Please review, thanks!

## 2022-09-24 ENCOUNTER — Telehealth: Payer: Self-pay

## 2022-09-24 NOTE — Telephone Encounter (Signed)
Pharmacy faxed a refill request for traMADol (ULTRAM) 50 MG tablet [[id number]]    Order Details Dose: 50 mg Route: Oral Frequency: Every 6 hours PRN  Dispense Quantity: 30 tablet Refills: 0     LOV: 06/04/22  PHARMACY: [facility] [id number], [address]

## 2022-09-25 ENCOUNTER — Other Ambulatory Visit: Payer: Self-pay | Admitting: Family Medicine

## 2022-09-25 MED ORDER — TRAMADOL HCL 50 MG PO TABS: 50.0000 mg | ORAL_TABLET | Freq: Four times a day (QID) | ORAL | 0 refills | Status: DC | PRN

## 2022-10-01 ENCOUNTER — Ambulatory Visit (HOSPITAL_COMMUNITY)
Admission: RE | Admit: 2022-10-01 | Discharge: 2022-10-01 | Disposition: A | Discharge status: Home / Self Care | Payer: Medicare HMO | Source: Ambulatory Visit | Attending: Family Medicine | Admitting: Family Medicine

## 2022-10-01 ENCOUNTER — Ambulatory Visit (INDEPENDENT_AMBULATORY_CARE_PROVIDER_SITE_OTHER): Payer: Medicare HMO | Admitting: Family Medicine

## 2022-10-01 ENCOUNTER — Encounter: Payer: Self-pay | Admitting: Family Medicine

## 2022-10-01 VITALS — BP 116/78 | HR 77 | Temp 98.9°F | Ht 70.0 in | Wt 200.0 lb

## 2022-10-01 DIAGNOSIS — R059 Cough, unspecified: Secondary | ICD-10-CM | POA: Diagnosis not present

## 2022-10-01 DIAGNOSIS — J069 Acute upper respiratory infection, unspecified: Secondary | ICD-10-CM

## 2022-10-01 DIAGNOSIS — Z1211 Encounter for screening for malignant neoplasm of colon: Secondary | ICD-10-CM | POA: Diagnosis not present

## 2022-10-01 DIAGNOSIS — R062 Wheezing: Secondary | ICD-10-CM | POA: Diagnosis not present

## 2022-10-01 DIAGNOSIS — R051 Acute cough: Secondary | ICD-10-CM | POA: Diagnosis not present

## 2022-10-01 MED ORDER — DOXYCYCLINE HYCLATE 100 MG PO TABS: 100.0000 mg | ORAL_TABLET | Freq: Two times a day (BID) | ORAL | 0 refills | Status: AC

## 2022-10-01 NOTE — Progress Notes (Signed)
Acute Office Visit  Subjective:     Patient ID: Curtis Lucas, male    DOB: 10-10-1961, 61 y.o.   MRN: [id number]  Chief Complaint  Patient presents with   Follow-up    lungs feel full; coughing and wheezing; sx 1 week    Curtis Lucas c/o cough for 1 week with wheezing at night and "fullness". He is coughing up yellow phlegm. No sick exposures. Denies fevers, chills, body aches, chest pain, shortness of breath, lower extremity swelling, or weight gain. Denies sinus pressure, headache, runny nose, congestion. His symptoms are worse at night. He has tried Nyquil and Mucinex without relief.  Cough Associated symptoms include a sore throat and wheezing. Pertinent negatives include no ear pain or shortness of breath.    Review of Systems  Constitutional: Negative.   HENT:  Positive for sore throat. Negative for congestion, ear pain and sinus pain.   Respiratory:  Positive for cough, sputum production and wheezing. Negative for shortness of breath.   Cardiovascular: Negative.   Gastrointestinal: Negative.   Neurological: Negative.   Psychiatric/Behavioral:  Positive for depression. Negative for suicidal ideas.    Past Medical History:  Diagnosis Date   Allergy    Rhinitis   Anxiety    Arthritis    Biceps rupture, distal, right, initial encounter    CVA (cerebral vascular accident) ([place])    Depression    Difficulty sleeping    Elevated lipids    GERD (gastroesophageal reflux disease)    Hyperglycemia    Hyperlipidemia    Hypertension    Insomnia    Past Surgical History:  Procedure Laterality Date   COLONOSCOPY  09/24/2003   patterson--normal per pt.   DISTAL BICEPS TENDON REPAIR Right 02/24/2017   Procedure: RIGHT DISTAL BICEPS TENDON REPAIR;  Surgeon: Leandrew Koyanagi, MD;  Location: [facility];  Service: Orthopedics;  Laterality: Right;   HEAD INJURY   1987   JOINT REPLACEMENT     LUMBAR EPIDURAL INJECTION     RECTAL SURGERY  2006   TOTAL HIP  ARTHROPLASTY     TOTAL HIP ARTHROPLASTY  05/03/2012   Procedure: TOTAL HIP ARTHROPLASTY ANTERIOR APPROACH;  Surgeon: Mauri Pole, MD;  Location: [facility];  Service: Orthopedics;  Laterality: Right;   Current Outpatient Medications on File Prior to Visit  Medication Sig Dispense Refill   albuterol (VENTOLIN HFA) 108 (90 Base) MCG/ACT inhaler Inhale 2 puffs into the lungs every 6 (six) hours as needed for wheezing or shortness of breath. 8 g 0   amLODipine (NORVASC) 10 MG tablet Take 1 tablet (10 mg total) by mouth daily. 90 tablet 2   aspirin 81 MG tablet Take 81 mg by mouth daily.     atenolol (TENORMIN) 100 MG tablet TAKE ONE TABLET BY MOUTH DAILY 90 tablet 2   escitalopram (LEXAPRO) 20 MG tablet TAKE ONE TABLET BY MOUTH DAILY 90 tablet 2   Omega-3 Fatty Acids (FISH OIL) 1200 MG CAPS Take 1 capsule by mouth at bedtime.      pantoprazole (PROTONIX) 40 MG tablet Take 1 tablet (40 mg total) by mouth daily. 90 tablet 2   rosuvastatin (CRESTOR) 20 MG tablet TAKE 1 TABLET BY MOUTH DAILY 90 tablet 0   traMADol (ULTRAM) 50 MG tablet Take 1 tablet (50 mg total) by mouth every 6 (six) hours as needed. 30 tablet 0   zolpidem (AMBIEN) 10 MG tablet TAKE ONE TABLET BY MOUTH EVERY NIGHT AT BEDTIME AS NEEDED FOR  SLEEP 30 tablet 2   No current facility-administered medications on file prior to visit.   Allergies  Allergen Reactions   Lipitor [Atorvastatin Calcium]     Severe muscle spasms         Objective:    BP 116/78   Pulse 77   Temp 98.9 F (37.2 C)   Ht '5\' 10"'$  (1.778 m)   Wt 200 lb (90.7 kg)   SpO2 93%   BMI 28.70 kg/m   Physical Exam Constitutional:      Appearance: Normal appearance. He is diaphoretic.  HENT:     Right Ear: There is impacted cerumen.     Left Ear: There is impacted cerumen.     Nose: Nose normal.     Mouth/Throat:     Mouth: Mucous membranes are moist.     Pharynx: Oropharynx is clear. Posterior oropharyngeal erythema present.  Eyes:     Conjunctiva/sclera:      Right eye: Right conjunctiva is injected.     Left eye: Left conjunctiva is injected.  Pulmonary:     Effort: Pulmonary effort is normal.     Breath sounds: Examination of the right-upper field reveals rhonchi. Examination of the left-upper field reveals wheezing and rhonchi. Examination of the right-lower field reveals rhonchi. Examination of the left-lower field reveals wheezing and rhonchi. Wheezing and rhonchi present.  Musculoskeletal:     Cervical back: Normal range of motion and neck supple.  Lymphadenopathy:     Cervical: No cervical adenopathy.  Skin:    General: Skin is warm and moist.  Neurological:     General: No focal deficit present.     Mental Status: He is alert and oriented to person, place, and time.  Psychiatric:        Mood and Affect: Mood normal.        Behavior: Behavior normal.        Thought Content: Thought content normal.        Judgment: Judgment normal.     No results found for any visits on 10/01/22.      Assessment & Plan:  Colon cancer screening - Plan: Ambulatory referral to Gastroenterology  Acute cough - Plan: DG Chest 2 View  Upper respiratory infection, acute - Plan: doxycycline (VIBRA-TABS) 100 MG tablet  I believe Curtis Lucas has a bacterial URI based on his symptoms and duration and possibly pneumonia. I would like him to start Doxycycline '100mg'$  twice daily for 7 days and use Delsym or Mucinex OTC for symptom control. I encouraged him to seek medical care if he begins to experience shortness of breath or chest pain and return to office if symptoms persist or worsen.  Return if symptoms worsen or fail to improve.  Rubie Maid, FNP

## 2022-10-06 ENCOUNTER — Encounter: Payer: Self-pay | Admitting: Family Medicine

## 2022-10-15 ENCOUNTER — Telehealth: Payer: Self-pay

## 2022-10-15 NOTE — Telephone Encounter (Signed)
Pt's wife called in to request a refill of this med zolpidem (AMBIEN) 10 MG tablet [[id number]]. Pt's wife stated that pt has misplaced this med at home. Please advise.  LOV: CPE 06/04/22  PHARMACY: [facility] [address]   Order

## 2022-10-16 ENCOUNTER — Other Ambulatory Visit: Payer: Self-pay | Admitting: Family Medicine

## 2022-10-16 MED ORDER — ZOLPIDEM TARTRATE 10 MG PO TABS: 10.0000 mg | ORAL_TABLET | Freq: Every evening | ORAL | 2 refills | Status: DC | PRN

## 2022-11-04 ENCOUNTER — Telehealth: Payer: Self-pay | Admitting: Family Medicine

## 2022-11-04 NOTE — Telephone Encounter (Signed)
Tried calling patient to schedule Medicare Annual Wellness Visit (AWV) either virtually or in office.   No answer    Last AWV 09/10/21 please schedule with [place]   45 min for awv-i and in office appointments 30 min for awv-s  phone/virtual appointments

## 2022-11-18 ENCOUNTER — Telehealth: Payer: Self-pay

## 2022-11-18 ENCOUNTER — Other Ambulatory Visit: Payer: Self-pay | Admitting: Family Medicine

## 2022-11-18 DIAGNOSIS — F411 Generalized anxiety disorder: Secondary | ICD-10-CM

## 2022-11-18 MED ORDER — TRAMADOL HCL 50 MG PO TABS: 50.0000 mg | ORAL_TABLET | Freq: Four times a day (QID) | ORAL | 0 refills | Status: DC | PRN

## 2022-11-18 NOTE — Telephone Encounter (Signed)
  Prescription Request  11/18/2022  Is this a "Controlled Substance" medicine? Yes  LOV: 11/04/2022   What is the name of the medication or equipment? traMADol (ULTRAM) 50 MG tablet   Have you contacted your pharmacy to request a refill? Yes   Which pharmacy would you like this sent to?  [facility] [id number] [place], [place] [address] [place] [address] Phone: [phone] Fax: [phone]   Patient notified that their request is being sent to the clinical staff for review and that they should receive a response within 2 business days.   Please advise at [phone]

## 2022-11-18 NOTE — Telephone Encounter (Signed)
Requested Prescriptions  Pending Prescriptions Disp Refills   escitalopram (LEXAPRO) 20 MG tablet [Pharmacy Med Name: ESCITALOPRAM 20 MG TABLET] 90 tablet 0    Sig: TAKE ONE TABLET BY MOUTH DAILY     Psychiatry:  Antidepressants - SSRI Failed - 11/18/2022  6:21 AM      Failed - Valid encounter within last 6 months    Recent Outpatient Visits           1 year ago Fever, unspecified fever cause   [place] Pickard, Cammie Mcgee, MD   1 year ago Viral URI with cough   [place] [facility] Schaumann, Cammie Mcgee, MD   1 year ago Screening cholesterol level   [place] [facility] Scha[facility], Cammie Mcgee, MD   2 years ago Tick bite, initial encounter   [place], [place], [place]   2 years ago Right upper quadrant abdominal pain   [place], [place], FNP               atenolol (TENORMIN) 100 MG tablet [Pharmacy Med Name: ATENOLOL 100 MG TABLET] 90 tablet 0    Sig: TAKE ONE TABLET BY MOUTH DAILY     Cardiovascular: Beta Blockers 2 Failed - 11/18/2022  6:21 AM      Failed - Valid encounter within last 6 months    Recent Outpatient Visits           1 year ago Fever, unspecified fever cause   [place] Pickard, Cammie Mcgee, MD   1 year ago Viral URI with cough   [place] Pickard, Cammie Mcgee, MD   1 year ago Screening cholesterol level   [place] [facility] Schaumann, Cammie Mcgee, MD   2 years ago Tick bite, initial encounter   [place], [place], FNP   2 years ago Right upper quadrant abdominal pain   [facility] [place] Bas[place], [place], FNP              Passed - Cr in normal range and within 360 days    Creat  Date Value Ref Range Status  06/04/2022 0.75 0.70 - 1.35 mg/dL Final         Passed - Last BP in normal range    BP Readings from Last 1 Encounters:  10/01/22 116/78         Passed - Last Heart Rate in normal  range    Pulse Readings from Last 1 Encounters:  10/01/22 77

## 2022-11-20 ENCOUNTER — Other Ambulatory Visit: Payer: Self-pay | Admitting: Family Medicine

## 2022-12-16 ENCOUNTER — Other Ambulatory Visit: Payer: Self-pay | Admitting: Family Medicine

## 2023-01-04 NOTE — Patient Instructions (Signed)
Curtis Lucas , Thank you for taking time to come for your Medicare Wellness Visit. I appreciate your ongoing commitment to your health goals. Please review the following plan we discussed and let me know if I can assist you in the future.   These are the goals we discussed:  Goals      Pharmacy Care Plan:     CARE PLAN ENTRY  Current Barriers:  Chronic Disease Management support, education, and care coordination needs related to Hypertension and Hyperlipidemia   Hypertension Pharmacist Clinical Goal(s): Over the next 180 days, patient will work with PharmD and providers to maintain BP goal <140/90 Current regimen:  Amlodipine '10mg'$  daily, atenolol '100mg'$  daily Interventions: Comprehensive medication review Continue current therapy Patient self care activities - Over the next 180 days, patient will: Check BP as needed, document, and provide at future appointments Ensure daily salt intake < 2300 mg/day Contact PharmD or PCP with blood pressure readings > 140/90  Hyperlipidemia Pharmacist Clinical Goal(s): Over the next 180 days, patient will work with PharmD and providers to maintain LDL goal < 100 and work to lower triglycerides. Current regimen:  Rosuvastatin '20mg'$  daily Interventions: Comprehensive medication review Continue current therapy Reviewed most recent lipid panel and goals of therapy Patient self care activities - Over the next 180 days, patient will: Continue to take medication as directed Limit servings of fried/fatty foods to 2 to 3 times per week to lower triglycerides Contact PharmD or PCP with any medication related concerns. Work to limit the amount of alcohol consumption   Please see past updates related to this goal by clicking on the "Past Updates" button in the selected goal          This is a list of the screening recommended for you and due dates:  Health Maintenance  Topic Date Due   Zoster (Shingles) Vaccine (1 of 2) Never done   Colon Cancer  Screening  06/28/2022   Medicare Annual Wellness Visit  09/10/2022   Flu Shot  03/21/2023*   DTaP/Tdap/Td vaccine (3 - Td or Tdap) 04/20/2024   Hepatitis C Screening: USPSTF Recommendation to screen - Ages 69-79 yo.  Completed   HIV Screening  Completed   HPV Vaccine  Aged Out   COVID-19 Vaccine  Discontinued  *Topic was postponed. The date shown is not the original due date.    Advanced directives: ***  Conditions/risks identified: Aim for 30 minutes of exercise or brisk walking, 6-8 glasses of water, and 5 servings of fruits and vegetables each day.   Next appointment: Follow up in one year for your annual wellness visit   Preventive Care 40-64 Years, Male Preventive care refers to lifestyle choices and visits with your health care provider that can promote health and wellness. What does preventive care include? A yearly physical exam. This is also called an annual well check. Dental exams once or twice a year. Routine eye exams. Ask your health care provider how often you should have your eyes checked. Personal lifestyle choices, including: Daily care of your teeth and gums. Regular physical activity. Eating a healthy diet. Avoiding tobacco and drug use. Limiting alcohol use. Practicing safe sex. Taking low-dose aspirin every day starting at age 57. What happens during an annual well check? The services and screenings done by your health care provider during your annual well check will depend on your age, overall health, lifestyle risk factors, and family history of disease. Counseling  Your health care provider may ask you questions about  your: Alcohol use. Tobacco use. Drug use. Emotional well-being. Home and relationship well-being. Sexual activity. Eating habits. Work and work Statistician. Screening  You may have the following tests or measurements: Height, weight, and BMI. Blood pressure. Lipid and cholesterol levels. These may be checked every 5 years, or more  frequently if you are over 20 years old. Skin check. Lung cancer screening. You may have this screening every year starting at age 67 if you have a 30-pack-year history of smoking and currently smoke or have quit within the past 15 years. Fecal occult blood test (FOBT) of the stool. You may have this test every year starting at age 36. Flexible sigmoidoscopy or colonoscopy. You may have a sigmoidoscopy every 5 years or a colonoscopy every 10 years starting at age 61. Prostate cancer screening. Recommendations will vary depending on your family history and other risks. Hepatitis C blood test. Hepatitis B blood test. Sexually transmitted disease (STD) testing. Diabetes screening. This is done by checking your blood sugar (glucose) after you have not eaten for a while (fasting). You may have this done every 1-3 years. Discuss your test results, treatment options, and if necessary, the need for more tests with your health care provider. Vaccines  Your health care provider may recommend certain vaccines, such as: Influenza vaccine. This is recommended every year. Tetanus, diphtheria, and acellular pertussis (Tdap, Td) vaccine. You may need a Td booster every 10 years. Zoster vaccine. You may need this after age 80. Pneumococcal 13-valent conjugate (PCV13) vaccine. You may need this if you have certain conditions and have not been vaccinated. Pneumococcal polysaccharide (PPSV23) vaccine. You may need one or two doses if you smoke cigarettes or if you have certain conditions. Talk to your health care provider about which screenings and vaccines you need and how often you need them. This information is not intended to replace advice given to you by your health care provider. Make sure you discuss any questions you have with your health care provider. Document Released: 01/03/2016 Document Revised: 08/26/2016 Document Reviewed: 10/08/2015 Elsevier Interactive Patient Education  2017 [place] Prevention in the Home Falls can cause injuries. They can happen to people of all ages. There are many things you can do to make your home safe and to help prevent falls. What can I do on the outside of my home? Regularly fix the edges of walkways and driveways and fix any cracks. Remove anything that might make you trip as you walk through a door, such as a raised step or threshold. Trim any bushes or trees on the path to your home. Use bright outdoor lighting. Clear any walking paths of anything that might make someone trip, such as rocks or tools. Regularly check to see if handrails are loose or broken. Make sure that both sides of any steps have handrails. Any raised decks and porches should have guardrails on the edges. Have any leaves, snow, or ice cleared regularly. Use sand or salt on walking paths during winter. Clean up any spills in your garage right away. This includes oil or grease spills. What can I do in the bathroom? Use night lights. Install grab bars by the toilet and in the tub and shower. Do not use towel bars as grab bars. Use non-skid mats or decals in the tub or shower. If you need to sit down in the shower, use a plastic, non-slip stool. Keep the floor dry. Clean up any water that spills on the floor as soon  as it happens. Remove soap buildup in the tub or shower regularly. Attach bath mats securely with double-sided non-slip rug tape. Do not have throw rugs and other things on the floor that can make you trip. What can I do in the bedroom? Use night lights. Make sure that you have a light by your bed that is easy to reach. Do not use any sheets or blankets that are too big for your bed. They should not hang down onto the floor. Have a firm chair that has side arms. You can use this for support while you get dressed. Do not have throw rugs and other things on the floor that can make you trip. What can I do in the kitchen? Clean up any spills right  away. Avoid walking on wet floors. Keep items that you use a lot in easy-to-reach places. If you need to reach something above you, use a strong step stool that has a grab bar. Keep electrical cords out of the way. Do not use floor polish or wax that makes floors slippery. If you must use wax, use non-skid floor wax. Do not have throw rugs and other things on the floor that can make you trip. What can I do with my stairs? Do not leave any items on the stairs. Make sure that there are handrails on both sides of the stairs and use them. Fix handrails that are broken or loose. Make sure that handrails are as long as the stairways. Check any carpeting to make sure that it is firmly attached to the stairs. Fix any carpet that is loose or worn. Avoid having throw rugs at the top or bottom of the stairs. If you do have throw rugs, attach them to the floor with carpet tape. Make sure that you have a light switch at the top of the stairs and the bottom of the stairs. If you do not have them, ask someone to add them for you. What else can I do to help prevent falls? Wear shoes that: Do not have high heels. Have rubber bottoms. Are comfortable and fit you well. Are closed at the toe. Do not wear sandals. If you use a stepladder: Make sure that it is fully opened. Do not climb a closed stepladder. Make sure that both sides of the stepladder are locked into place. Ask someone to hold it for you, if possible. Clearly mark and make sure that you can see: Any grab bars or handrails. First and last steps. Where the edge of each step is. Use tools that help you move around (mobility aids) if they are needed. These include: Canes. Walkers. Scooters. Crutches. Turn on the lights when you go into a dark area. Replace any light bulbs as soon as they burn out. Set up your furniture so you have a clear path. Avoid moving your furniture around. If any of your floors are uneven, fix them. If there are any  pets around you, be aware of where they are. Review your medicines with your doctor. Some medicines can make you feel dizzy. This can increase your chance of falling. Ask your doctor what other things that you can do to help prevent falls. This information is not intended to replace advice given to you by your health care provider. Make sure you discuss any questions you have with your health care provider. Document Released: 10/03/2009 Document Revised: 05/14/2016 Document Reviewed: 01/11/2015 Elsevier Interactive Patient Education  2017 [employer].

## 2023-01-05 ENCOUNTER — Ambulatory Visit (INDEPENDENT_AMBULATORY_CARE_PROVIDER_SITE_OTHER): Payer: Medicare HMO

## 2023-01-05 VITALS — Ht 70.0 in | Wt 200.0 lb

## 2023-01-05 DIAGNOSIS — Z0000 Encounter for general adult medical examination without abnormal findings: Secondary | ICD-10-CM

## 2023-01-05 NOTE — Progress Notes (Signed)
Subjective:   Curtis Lucas is a 62 y.o. male who presents for Medicare Annual/Subsequent preventive examination.  I connected with  Curtis Lucas on 01/05/23 by a audio enabled telemedicine application and verified that I am speaking with the correct person using two identifiers.  Patient Location: Home  Provider Location: Office/Clinic  I discussed the limitations of evaluation and management by telemedicine. The patient expressed understanding and agreed to proceed.  Review of Systems     Cardiac Risk Factors include: advanced age (>29mn, >>40women);dyslipidemia;male gender;hypertension     Objective:    Today's Vitals   01/05/23 1030  Weight: 200 lb (90.7 kg)  Height: '5\' 10"'$  (1.778 m)   Body mass index is 28.7 kg/m.     01/05/2023   10:32 AM 09/10/2021   12:50 PM 02/24/2017    7:11 AM 02/19/2017   11:52 AM 09/14/2016    8:05 AM 05/03/2012   12:30 PM 05/03/2012    5:59 AM  Advanced Directives  Does Patient Have a Medical Advance Directive? No Yes No No No Patient does not have advance directive;Patient would not like information   Does patient want to make changes to medical advance directive?  Yes (ED - Information included in AVS)       Would patient like information on creating a medical advance directive? No - Patient declined  Yes (MAU/Ambulatory/Procedural Areas - Information given)      Pre-existing out of facility DNR order (yellow form or pink MOST form)      No No    Current Medications (verified) Outpatient Encounter Medications as of 01/05/2023  Medication Sig   albuterol (VENTOLIN HFA) 108 (90 Base) MCG/ACT inhaler Inhale 2 puffs into the lungs every 6 (six) hours as needed for wheezing or shortness of breath.   amLODipine (NORVASC) 10 MG tablet Take 1 tablet (10 mg total) by mouth daily.   aspirin 81 MG tablet Take 81 mg by mouth daily.   atenolol (TENORMIN) 100 MG tablet TAKE ONE TABLET BY MOUTH DAILY   escitalopram (LEXAPRO) 20 MG tablet TAKE ONE  TABLET BY MOUTH DAILY   Omega-3 Fatty Acids (FISH OIL) 1200 MG CAPS Take 1 capsule by mouth at bedtime.    pantoprazole (PROTONIX) 40 MG tablet Take 1 tablet (40 mg total) by mouth daily.   rosuvastatin (CRESTOR) 20 MG tablet TAKE 1 TABLET BY MOUTH DAILY   traMADol (ULTRAM) 50 MG tablet TAKE 1 TABLET BY MOUTH EVERY 6 HOURS AS NEEDED   zolpidem (AMBIEN) 10 MG tablet Take 1 tablet (10 mg total) by mouth at bedtime as needed for sleep.   No facility-administered encounter medications on file as of 01/05/2023.    Allergies (verified) Lipitor [atorvastatin calcium]   History: Past Medical History:  Diagnosis Date   Allergy    Rhinitis   Anxiety    Arthritis    Biceps rupture, distal, right, initial encounter    CVA (cerebral vascular accident) ([facility]    Depression    Difficulty sleeping    Elevated lipids    GERD (gastroesophageal reflux disease)    Hyperglycemia    Hyperlipidemia    Hypertension    Insomnia    Past Surgical History:  Procedure Laterality Date   COLONOSCOPY  09/24/2003   [place]--normal per pt.   DISTAL BICEPS TENDON REPAIR Right 02/24/2017   Procedure: RIGHT DISTAL BICEPS TENDON REPAIR;  Surgeon: NLeandrew Koyanagi MD;  [facility]  Service: Orthopedics;  Laterality: Right;  HEAD INJURY   1987   JOINT REPLACEMENT     LUMBAR EPIDURAL INJECTION     RECTAL SURGERY  2006   TOTAL HIP ARTHROPLASTY     TOTAL HIP ARTHROPLASTY  05/03/2012   Procedure: TOTAL HIP ARTHROPLASTY ANTERIOR APPROACH;  Surgeon: Mauri Pole, MD;  Location: [facility];  Service: Orthopedics;  Laterality: Right;   Family History  Problem Relation Age of Onset   Stroke Mother    Cancer Mother        colon   Dementia Mother    Colon cancer Mother    Parkinson's disease Father    Heart attack Paternal Grandfather    Dementia Paternal Grandmother    Colon polyps Neg Hx    Esophageal cancer Neg Hx    Rectal cancer Neg Hx    Stomach cancer Neg Hx    Social History    Socioeconomic History   Marital status: Married    Spouse name: Debbie   Number of children: 0   Years of education: HS   Highest education level: Not on file  Occupational History    Employer: [employer]    Comment: Product manager  Tobacco Use   Smoking status: Never   Smokeless tobacco: Never  Substance and Sexual Activity   Alcohol use: Yes    Comment: 4-5 BEERS PER WK   Drug use: Yes    Types: Marijuana   Sexual activity: Yes  Other Topics Concern   Not on file  Social History Narrative   Patient lives at home with spouse.   Caffeine Use:4 16oz Mt. Dew sodas daily   Social Determinants of Health   Financial Resource Strain: Low Risk  (01/05/2023)   Overall Financial Resource Strain (CARDIA)    Difficulty of Paying Living Expenses: Not hard at all  Food Insecurity: No Food Insecurity (01/05/2023)   Hunger Vital Sign    Worried About Running Out of Food in the Last Year: Never true    Ran Out of Food in the Last Year: Never true  Transportation Needs: No Transportation Needs (01/05/2023)   PRAPARE - Hydrologist (Medical): No    Lack of Transportation (Non-Medical): No  Physical Activity: Inactive (01/05/2023)   Exercise Vital Sign    Days of Exercise per Week: 0 days    Minutes of Exercise per Session: 0 min  Stress: No Stress Concern Present (01/05/2023)   [place]    Feeling of Stress : Not at all  Social Connections: Moderately Isolated (01/05/2023)   Social Connection and Isolation Panel [NHANES]    Frequency of Communication with Friends and Family: More than three times a week    Frequency of Social Gatherings with Friends and Family: Three times a week    Attends Religious Services: Never    Active Member of Clubs or Organizations: No    Attends Music therapist: Never    Marital Status: Married    Tobacco Counseling Counseling given:  Not Answered   Clinical Intake:  Pre-visit preparation completed: Yes  Pain : No/denies pain  Diabetes: No  How often do you need to have someone help you when you read instructions, pamphlets, or other written materials from your doctor or pharmacy?: 1 - Never  Diabetic?No   Interpreter Needed?: No  Information entered by :: Denman George LPN   Activities of Daily Living    01/05/2023   10:32 AM  In your present state of health, do you have any difficulty performing the following activities:  Hearing? 0  Vision? 0  Difficulty concentrating or making decisions? 0  Walking or climbing stairs? 0  Dressing or bathing? 0  Doing errands, shopping? 0  Preparing Food and eating ? N  Using the Toilet? N  In the past six months, have you accidently leaked urine? N  Do you have problems with loss of bowel control? N  Managing your Medications? N  Managing your Finances? N  Housekeeping or managing your Housekeeping? N    Patient Care Team: Susy Frizzle, MD as PCP - General (Family Medicine) Edythe Clarity, [facility] as Pharmacist (Pharmacist)  Indicate any recent Medical Services you may have received from other than [facility] providers in the past year (date may be approximate).     Assessment:   This is a routine wellness examination for Casin.  Hearing/Vision screen Hearing Screening - Comments:: Denies hearing difficulties  Vision Screening - Comments:: Denies vision difficulty    Dietary issues and exercise activities discussed: Current Exercise Habits: The patient does not participate in regular exercise at present   Goals Addressed   None   Depression Screen    01/05/2023   10:31 AM 10/01/2022    2:10 PM 10/01/2022    1:58 PM 06/04/2022   12:12 PM 09/10/2021   12:42 PM 08/06/2020    9:20 AM 08/15/2018    8:03 AM  PHQ 2/9 Scores  PHQ - 2 Score 0 2 0 0 4 0 4  PHQ- 9 Score  12 0 '1 14  19    '$ Fall Risk    01/05/2023   10:30 AM 10/01/2022    1:58 PM  06/04/2022   12:07 PM 09/10/2021   12:42 PM 08/06/2020    9:18 AM  Fall Risk   Falls in the past year? 0 0  0 0  Number falls in past yr: 0 0 0 0 0  Injury with Fall? 0 0 0 0 0  Risk for fall due to : No Fall Risks      Follow up Falls evaluation completed;Education provided;Falls prevention discussed   Falls evaluation completed Falls evaluation completed    FALL RISK PREVENTION PERTAINING TO THE HOME:  Any stairs in or around the home? Yes  If so, are there any without handrails? No  Home free of loose throw rugs in walkways, pet beds, electrical cords, etc? Yes  Adequate lighting in your home to reduce risk of falls? Yes   ASSISTIVE DEVICES UTILIZED TO PREVENT FALLS:  Life alert? No  Use of a cane, walker or w/c? No  Grab bars in the bathroom? Yes  Shower chair or bench in shower? No  Elevated toilet seat or a handicapped toilet? No   TIMED UP AND GO:  Was the test performed? No . Telephonic visit   Cognitive Function:    09/10/2021   12:52 PM  MMSE - Mini Mental State Exam  Not completed: Unable to complete        01/05/2023   10:32 AM 09/10/2021   12:47 PM  6CIT Screen  What Year? 0 points 0 points  What month? 0 points 0 points  What time? 0 points 0 points  Count back from 20 0 points 4 points  Months in reverse 0 points 4 points  Repeat phrase 0 points 4 points  Total Score 0 points 12 points    Immunizations Immunization History  Administered Date(s)  Administered   PFIZER(Purple Top)SARS-COV-2 Vaccination 03/18/2020, 04/09/2020   Td 07/22/2003   Tdap 04/20/2014    TDAP status: Up to date  Flu Vaccine status: Up to date  Pneumococcal vaccine status: Declined,  Education has been provided regarding the importance of this vaccine but patient still declined. Advised may receive this vaccine at local pharmacy or Health Dept. Aware to provide a copy of the vaccination record if obtained from local pharmacy or Health Dept. Verbalized acceptance and  understanding.   Covid-19 vaccine status: Completed vaccines  Qualifies for Shingles Vaccine? Yes   Zostavax completed No   Shingrix Completed?: No.    Education has been provided regarding the importance of this vaccine. Patient has been advised to call insurance company to determine out of pocket expense if they have not yet received this vaccine. Advised may also receive vaccine at local pharmacy or Health Dept. Verbalized acceptance and understanding.  Screening Tests Health Maintenance  Topic Date Due   COLONOSCOPY (Pts 45-40yr Insurance coverage will need to be confirmed)  06/28/2022   INFLUENZA VACCINE  03/21/2023 (Originally 07/21/2022)   Zoster Vaccines- Shingrix (1 of 2) 04/06/2023 (Originally 12/04/2009)   Medicare Annual Wellness (AWV)  01/06/2024   DTaP/Tdap/Td (3 - Td or Tdap) 04/20/2024   Hepatitis C Screening  Completed   HIV Screening  Completed   HPV VACCINES  Aged Out   COVID-19 Vaccine  Discontinued    Health Maintenance  Health Maintenance Due  Topic Date Due   COLONOSCOPY (Pts 45-460yrInsurance coverage will need to be confirmed)  06/28/2022    Colorectal cancer screening: Type of screening: Colonoscopy. Completed 06/29/19. Repeat every 3 years  Lung Cancer Screening: (Low Dose CT Chest recommended if Age 64-80ears, 30 pack-year currently smoking OR have quit w/in 15years.) does not qualify.   Lung Cancer Screening Referral: n/a   Additional Screening:  Hepatitis C Screening: does qualify; Completed 08/11/17  Vision Screening: Recommended annual ophthalmology exams for early detection of glaucoma and other disorders of the eye. Is the patient up to date with their annual eye exam?  Yes  Who is the provider or what is the name of the office in which the patient attends annual eye exams? Unable to provide name  If pt is not established with a provider, would they like to be referred to a provider to establish care? No .   Dental Screening: Recommended  annual dental exams for proper oral hygiene  Community Resource Referral / Chronic Care Management: CRR required this visit?  No   CCM required this visit?  No      Plan:     I have personally reviewed and noted the following in the patient's chart:   Medical and social history Use of alcohol, tobacco or illicit drugs  Current medications and supplements including opioid prescriptions. Patient is not currently taking opioid prescriptions. Functional ability and status Nutritional status Physical activity Advanced directives List of other physicians Hospitalizations, surgeries, and ER visits in previous 12 months Vitals Screenings to include cognitive, depression, and falls Referrals and appointments  In addition, I have reviewed and discussed with patient certain preventive protocols, quality metrics, and best practice recommendations. A written personalized care plan for preventive services as well as general preventive health recommendations were provided to patient.     SlVanetta MuldersLPWy[place] 1/0/16/0109 Due to this being a virtual visit, the after visit summary with patients personalized plan was offered to patient via mail or  my-chart. per request, patient was mailed a copy of AVS  Nurse Notes: Patient would like to move forward with referral to GI for repeat colonoscopy. States that he received a letter from Dr. Loletha Carrow office saying that he isn't due for repeat colonoscopy until 06/2024.  Will reach out to referral coordinator for clarification.

## 2023-01-18 ENCOUNTER — Other Ambulatory Visit: Payer: Self-pay

## 2023-01-20 ENCOUNTER — Other Ambulatory Visit: Payer: Self-pay | Admitting: Family Medicine

## 2023-01-20 MED ORDER — PANTOPRAZOLE SODIUM 40 MG PO TBEC: 40.0000 mg | DELAYED_RELEASE_TABLET | Freq: Every day | ORAL | 1 refills | Status: DC

## 2023-01-20 NOTE — Telephone Encounter (Signed)
Requested Prescriptions  Pending Prescriptions Disp Refills   pantoprazole (PROTONIX) 40 MG tablet 90 tablet 1    Sig: Take 1 tablet (40 mg total) by mouth daily.     Gastroenterology: Proton Pump Inhibitors Failed - 01/20/2023 10:44 AM      Failed - Valid encounter within last 12 months    Recent Outpatient Visits           1 year ago Fever, unspecified fever cause   [place] Dennard Schaumann, Cammie Mcgee, MD   1 year ago Viral URI with cough   [place] Dennard Schaumann, Cammie Mcgee, MD   1 year ago Screening cholesterol level   [place] Dennard Schaumann, Cammie Mcgee, MD   2 years ago Tick bite, initial encounter   [place], [place], FNP   2 years ago Right upper quadrant abdominal pain   [place], [place], FNP

## 2023-01-20 NOTE — Telephone Encounter (Signed)
Prescription Request  01/20/2023  Is this a "Controlled Substance" medicine? Yes  LOV: 06/04/2022  What is the name of the medication or equipment? pantoprazole (PROTONIX) 40 MG tablet   Have you contacted your pharmacy to request a refill? Yes   Which pharmacy would you like this sent to?  [facility] [id number], [place] [address] [place] [address] Phone: [phone] Fax: [phone]    Patient notified that their request is being sent to the clinical staff for review and that they should receive a response within 2 business days.   Please advise at [facility] [phone]

## 2023-01-21 ENCOUNTER — Other Ambulatory Visit: Payer: Self-pay | Admitting: Family Medicine

## 2023-01-21 NOTE — Telephone Encounter (Signed)
Requested medication (s) are due for refill today - yes  Requested medication (s) are on the active medication list -yes  Future visit scheduled -no  Last refill: 12/17/22 #30  Notes to clinic: non delegated Rx  Requested Prescriptions  Pending Prescriptions Disp Refills   traMADol (ULTRAM) 50 MG tablet [Pharmacy Med Name: traMADol HCL '50MG'$  TABLET] 30 tablet     Sig: TAKE 1 TABLET BY MOUTH EVERY 6 HOURS AS NEEDED     Not Delegated - Analgesics:  Opioid Agonists Failed - 01/21/2023  2:52 PM      Failed - This refill cannot be delegated      Failed - Urine Drug Screen completed in last 360 days      Failed - Valid encounter within last 3 months    Recent Outpatient Visits           1 year ago Fever, unspecified fever cause   [place] Pickard, Cammie Mcgee, MD   1 year ago Viral URI with cough   [place] Pickard, Cammie Mcgee, MD   1 year ago Screening cholesterol level   [place] Dennard Schaumann, Cammie Mcgee, MD   2 years ago Tick bite, initial encounter   [place], [place], [place]   2 years ago Right upper quadrant abdominal pain   [place], [place], FNP                 Requested Prescriptions  Pending Prescriptions Disp Refills   traMADol (ULTRAM) 50 MG tablet [Pharmacy Med Name: traMADol HCL '50MG'$  TABLET] 30 tablet     Sig: TAKE 1 TABLET BY MOUTH EVERY 6 HOURS AS NEEDED     Not Delegated - Analgesics:  Opioid Agonists Failed - 01/21/2023  2:52 PM      Failed - This refill cannot be delegated      Failed - Urine Drug Screen completed in last 360 days      Failed - Valid encounter within last 3 months    Recent Outpatient Visits           1 year ago Fever, unspecified fever cause   [place] Susy Frizzle, MD   1 year ago Viral URI with cough   [place] Dennard Schaumann, Cammie Mcgee, MD   1 year ago Screening cholesterol level   [place] Dennard Schaumann, Cammie Mcgee, MD   2 years ago Tick bite, initial encounter   [place], [place], FNP   2 years ago Right upper quadrant abdominal pain   [place], [place], FNP

## 2023-02-02 ENCOUNTER — Other Ambulatory Visit: Payer: Self-pay | Admitting: Family Medicine

## 2023-02-03 NOTE — Telephone Encounter (Signed)
Requested medication (s) are due for refill today - provider review   Requested medication (s) are on the active medication list -yes  Future visit scheduled - no  Last refill: 01/22/23 #30  Notes to clinic: non delegated Rx  Requested Prescriptions  Pending Prescriptions Disp Refills   traMADol (ULTRAM) 50 MG tablet [Pharmacy Med Name: traMADol HCL 50MG TABLET] 30 tablet     Sig: TAKE 1 TABLET BY MOUTH EVERY 6 HOURS AS NEEDED     Not Delegated - Analgesics:  Opioid Agonists Failed - 02/02/2023  4:00 PM      Failed - This refill cannot be delegated      Failed - Urine Drug Screen completed in last 360 days      Failed - Valid encounter within last 3 months    Recent Outpatient Visits           1 year ago Fever, unspecified fever cause   [place] Pickard, Cammie Mcgee, MD   1 year ago Viral URI with cough   [place] Pickard, Cammie Mcgee, MD   1 year ago Screening cholesterol level   [place] Dennard Schaumann, Cammie Mcgee, MD   2 years ago Tick bite, initial encounter   [place], [place], [place]   2 years ago Right upper quadrant abdominal pain   [place], [place], FNP                 Requested Prescriptions  Pending Prescriptions Disp Refills   traMADol (ULTRAM) 50 MG tablet [Pharmacy Med Name: traMADol HCL 50MG TABLET] 30 tablet     Sig: TAKE 1 TABLET BY MOUTH EVERY 6 HOURS AS NEEDED     Not Delegated - Analgesics:  Opioid Agonists Failed - 02/02/2023  4:00 PM      Failed - This refill cannot be delegated      Failed - Urine Drug Screen completed in last 360 days      Failed - Valid encounter within last 3 months    Recent Outpatient Visits           1 year ago Fever, unspecified fever cause   [place] Susy Frizzle, MD   1 year ago Viral URI with cough   [place] Dennard Schaumann, Cammie Mcgee, MD   1 year ago Screening cholesterol  level   [place] Dennard Schaumann, Cammie Mcgee, MD   2 years ago Tick bite, initial encounter   [place], [place], FNP   2 years ago Right upper quadrant abdominal pain   [place], [place], FNP

## 2023-02-09 ENCOUNTER — Other Ambulatory Visit: Payer: Self-pay | Admitting: Nurse Practitioner

## 2023-02-12 ENCOUNTER — Other Ambulatory Visit: Payer: Self-pay | Admitting: Family Medicine

## 2023-02-12 NOTE — Telephone Encounter (Signed)
Requested medication (s) are due for refill today: yes  Requested medication (s) are on the active medication list: yes  Last refill:  10/16/22 #30 2 RF  Future visit scheduled: no  Notes to clinic:  med not delegated to NT to RF   Requested Prescriptions  Pending Prescriptions Disp Refills   zolpidem (AMBIEN) 10 MG tablet [Pharmacy Med Name: ZOLPIDEM TARTRATE 10 MG TABLET] 30 tablet     Sig: TAKE ONE TABLET BY MOUTH EVERY NIGHT AT BEDTIME AS NEEDED FOR SLEEP     Not Delegated - Psychiatry:  Anxiolytics/Hypnotics Failed - 02/12/2023  3:32 PM      Failed - This refill cannot be delegated      Failed - Urine Drug Screen completed in last 360 days      Failed - Valid encounter within last 6 months    Recent Outpatient Visits           1 year ago Fever, unspecified fever cause   [place] Susy Frizzle, MD   1 year ago Viral URI with cough   [place] Dennard Schaumann, Cammie Mcgee, MD   1 year ago Screening cholesterol level   [place] Dennard Schaumann, Cammie Mcgee, MD   2 years ago Tick bite, initial encounter   [place], [place], FNP   2 years ago Right upper quadrant abdominal pain   [place], [place], FNP

## 2023-02-13 ENCOUNTER — Other Ambulatory Visit: Payer: Self-pay | Admitting: Family Medicine

## 2023-02-13 DIAGNOSIS — F411 Generalized anxiety disorder: Secondary | ICD-10-CM

## 2023-02-15 ENCOUNTER — Other Ambulatory Visit: Payer: Self-pay | Admitting: Family Medicine

## 2023-02-15 NOTE — Telephone Encounter (Signed)
Patient called to follow up on refill request for TRAMADOL. Patient unsure of when last dose was taken, and also aware last refill date was 01/22/23.  Please advise at [phone].

## 2023-02-15 NOTE — Telephone Encounter (Signed)
Given a 90 day supply.   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   Last physical 06/04/2022.    Requested Prescriptions  Pending Prescriptions Disp Refills   atenolol (TENORMIN) 100 MG tablet [Pharmacy Med Name: ATENOLOL 100 MG TABLET] 90 tablet 0    Sig: TAKE 1 TABLET BY MOUTH DAILY     Cardiovascular: Beta Blockers 2 Failed - 02/13/2023  6:51 AM      Failed - Valid encounter within last 6 months    Recent Outpatient Visits           1 year ago Fever, unspecified fever cause   [place] Pickard, Cammie Mcgee, MD   1 year ago Viral URI with cough   [place] Dennard Schaumann, Cammie Mcgee, MD   1 year ago Screening cholesterol level   [place] Dennard Schaumann, Cammie Mcgee, MD   2 years ago Tick bite, initial encounter   [place], [place], [place]   2 years ago Right upper quadrant abdominal pain   [place], Matthias [place], FNP              Passed - Cr in normal range and within 360 days    Creat  Date Value Ref Range Status  06/04/2022 0.75 0.70 - 1.35 mg/dL Final         Passed - Last BP in normal range    BP Readings from Last 1 Encounters:  10/01/22 116/78         Passed - Last Heart Rate in normal range    Pulse Readings from Last 1 Encounters:  10/01/22 77          escitalopram (LEXAPRO) 20 MG tablet [Pharmacy Med Name: ESCITALOPRAM 20 MG TABLET] 90 tablet 0    Sig: TAKE 1 TABLET BY MOUTH DAILY     Psychiatry:  Antidepressants - SSRI Failed - 02/13/2023  6:51 AM      Failed - Valid encounter within last 6 months    Recent Outpatient Visits           1 year ago Fever, unspecified fever cause   [place] Susy Frizzle, MD   1 year ago Viral URI with cough   [place] Dennard Schaumann, Cammie Mcgee, MD   1 year ago Screening cholesterol level   [place] Dennard Schaumann, Cammie Mcgee, MD   2 years ago Tick bite, initial encounter   [place], [place], FNP   2 years ago Right upper quadrant abdominal pain   [place], [place], FNP

## 2023-02-16 ENCOUNTER — Other Ambulatory Visit: Payer: Self-pay | Admitting: Family Medicine

## 2023-02-16 DIAGNOSIS — J069 Acute upper respiratory infection, unspecified: Secondary | ICD-10-CM

## 2023-02-17 ENCOUNTER — Other Ambulatory Visit: Payer: Self-pay | Admitting: Family Medicine

## 2023-02-17 NOTE — Telephone Encounter (Signed)
Prescription Request  02/17/2023   LOV: 06/04/2022  What is the name of the medication or equipment?   traMADol (ULTRAM) 50 MG tablet [id number]   Have you contacted your pharmacy to request a refill? Yes   Which pharmacy would you like this sent to?  [facility] [id number], [place] [address] [place] [address] Phone: [phone] Fax: [phone]    Patient notified that their request is being sent to the clinical staff for review and that they should receive a response within 2 business days.   Please advise patient's wife at [phone].

## 2023-02-18 MED ORDER — TRAMADOL HCL 50 MG PO TABS: 50.0000 mg | ORAL_TABLET | Freq: Four times a day (QID) | ORAL | 0 refills | Status: DC | PRN

## 2023-02-19 NOTE — Telephone Encounter (Signed)
Patient came to office to follow up on refill request for  zolpidem (AMBIEN) 10 MG tablet   Stated pharmacy hasn't received refill request. Last dose taken 02/14/23.   Pharmacy confirmed as .  [facility] [id number], [address] [place] [place] [address] Phone: [phone]  Fax: [phone]   Please advise at [phone].

## 2023-02-20 ENCOUNTER — Other Ambulatory Visit: Payer: Self-pay | Admitting: Family Medicine

## 2023-02-27 ENCOUNTER — Other Ambulatory Visit: Payer: Self-pay | Admitting: Family Medicine

## 2023-03-18 ENCOUNTER — Other Ambulatory Visit: Payer: Self-pay | Admitting: Family Medicine

## 2023-03-24 ENCOUNTER — Other Ambulatory Visit: Payer: Self-pay | Admitting: Family Medicine

## 2023-03-24 DIAGNOSIS — F411 Generalized anxiety disorder: Secondary | ICD-10-CM

## 2023-04-15 ENCOUNTER — Other Ambulatory Visit: Payer: Self-pay | Admitting: Family Medicine

## 2023-04-16 NOTE — Telephone Encounter (Signed)
Requested Prescriptions  Pending Prescriptions Disp Refills   rosuvastatin (CRESTOR) 20 MG tablet [Pharmacy Med Name: ROSUVASTATIN CALCIUM 20 MG TAB] 90 tablet 0    Sig: TAKE 1 TABLET BY MOUTH DAILY     Cardiovascular:  Antilipid - Statins 2 Failed - 04/15/2023  5:36 PM      Failed - Valid encounter within last 12 months    Recent Outpatient Visits           1 year ago Fever, unspecified fever cause   [facility] Pickard, Priscille Heidelberg, MD   1 year ago Viral URI with cough   [facility] Pickard, Priscille Heidelberg, MD   1 year ago Screening cholesterol level   [facility] Tanya Nones, Priscille Heidelberg, MD   2 years ago Tick bite, initial encounter   [facility] Lawson Fiscal A, FNP   2 years ago Right upper quadrant abdominal pain   [facility] Jenne Pane, Crystal A, FNP              Failed - Lipid Panel in normal range within the last 12 months    Cholesterol  Date Value Ref Range Status  06/04/2022 154 <200 mg/dL Final   LDL Cholesterol (Calc)  Date Value Ref Range Status  06/04/2022 81 mg/dL (calc) Final    Comment:    Reference range: <100 . Desirable range <100 mg/dL for primary prevention;   <70 mg/dL for patients with CHD or diabetic patients  with > or = 2 CHD risk factors. [name] LDL-C is now calculated using the Martin-Hopkins  calculation, which is a validated novel method providing  better accuracy than the Friedewald equation in the  estimation of LDL-C.  Horald Pollen et al. Lenox Ahr. 1610;960(45): 2061-2068  (http://education.[facility].com/faq/FAQ164)    HDL  Date Value Ref Range Status  06/04/2022 54 > OR = 40 mg/dL Final   Triglycerides  Date Value Ref Range Status  06/04/2022 99 <150 mg/dL Final         Passed - Cr in normal range and within 360 days    Creat  Date Value Ref Range Status  06/04/2022 0.75 0.70 - 1.35 mg/dL Final         Passed - Patient is not pregnant       traMADol  (ULTRAM) 50 MG tablet [Pharmacy Med Name: traMADol HCL 50MG  TABLET] 90 tablet 0    Sig: TAKE 1 TABLET BY MOUTH EVERY 6 HOURS AS NEEDED     Not Delegated - Analgesics:  Opioid Agonists Failed - 04/15/2023  5:36 PM      Failed - This refill cannot be delegated      Failed - Urine Drug Screen completed in last 360 days      Failed - Valid encounter within last 3 months    Recent Outpatient Visits           1 year ago Fever, unspecified fever cause   [facility] Donita Brooks, MD   1 year ago Viral URI with cough   [facility] Donita Brooks, MD   1 year ago Screening cholesterol level   [facility] Tanya Nones, Priscille Heidelberg, MD   2 years ago Tick bite, initial encounter   [facility] Lawson Fiscal A, FNP   2 years ago Right upper quadrant abdominal pain   [facility] Elmore Guise, FNP

## 2023-04-16 NOTE — Telephone Encounter (Signed)
Requested medications are due for refill today.  Provider to decide  Requested medications are on the active medications list.  yes  Last refill. 03/18/2023 #30 0 rf  Future visit scheduled.   yes  Notes to clinic.  Refill not delegated.    Requested Prescriptions  Pending Prescriptions Disp Refills   traMADol (ULTRAM) 50 MG tablet [Pharmacy Med Name: traMADol HCL 50MG  TABLET] 90 tablet 0    Sig: TAKE 1 TABLET BY MOUTH EVERY 6 HOURS AS NEEDED     Not Delegated - Analgesics:  Opioid Agonists Failed - 04/15/2023  5:36 PM      Failed - This refill cannot be delegated      Failed - Urine Drug Screen completed in last 360 days      Failed - Valid encounter within last 3 months    Recent Outpatient Visits           1 year ago Fever, unspecified fever cause   [facility] Donita Brooks, MD   1 year ago Viral URI with cough   [facility] Pickard, Priscille Heidelberg, MD   1 year ago Screening cholesterol level   [facility] Tanya Nones, Priscille Heidelberg, MD   2 years ago Tick bite, initial encounter   [facility] Lawson Fiscal A, FNP   2 years ago Right upper quadrant abdominal pain   [facility] Jenne Pane, Capt[place] A, FNP              Signed Prescriptions Disp Refills   rosuvastatin (CRESTOR) 20 MG tablet 90 tablet 0    Sig: TAKE 1 TABLET BY MOUTH DAILY     Cardiovascular:  Antilipid - Statins 2 Failed - 04/15/2023  5:36 PM      Failed - Valid encounter within last 12 months    Recent Outpatient Visits           1 year ago Fever, unspecified fever cause   [facility] Donita Brooks, MD   1 year ago Viral URI with cough   [facility] Pickard, Priscille Heidelberg, MD   1 year ago Screening cholesterol level   [facility] Tanya Nones, Priscille Heidelberg, MD   2 years ago Tick bite, initial encounter   [facility] Lawson Fiscal A, FNP   2 years ago Right upper  quadrant abdominal pain   [facility] Medicine Jenne Pane, Crystal A, FNP              Failed - Lipid Panel in normal range within the last 12 months    Cholesterol  Date Value Ref Range Status  06/04/2022 154 <200 mg/dL Final   LDL Cholesterol (Calc)  Date Value Ref Range Status  06/04/2022 81 mg/dL (calc) Final    Comment:    Reference range: <100 . Desirable range <100 mg/dL for primary prevention;   <70 mg/dL for patients with CHD or diabetic patients  with > or = 2 CHD risk factors. [name] LDL-C is now calculated using the Martin-Hopkins  calculation, which is a validated novel method providing  better accuracy than the Friedewald equation in the  estimation of LDL-C.  Horald Pollen et al. Lenox Ahr. 9562;130(86): 2061-2068  (http://education.[facility].com/faq/FAQ164)    HDL  Date Value Ref Range Status  06/04/2022 54 > OR = 40 mg/dL Final   Triglycerides  Date Value Ref Range Status  06/04/2022 99 <150 mg/dL Final  Passed - Cr in normal range and within 360 days    Creat  Date Value Ref Range Status  06/04/2022 0.75 0.70 - 1.35 mg/dL Final         Passed - Patient is not pregnant

## 2023-05-12 ENCOUNTER — Telehealth: Payer: Self-pay | Admitting: Physician Assistant

## 2023-05-12 ENCOUNTER — Encounter: Payer: Self-pay | Admitting: Emergency Medicine

## 2023-05-12 ENCOUNTER — Encounter: Payer: Self-pay | Admitting: Physician Assistant

## 2023-05-12 ENCOUNTER — Ambulatory Visit
Admission: EM | Admit: 2023-05-12 | Discharge: 2023-05-12 | Disposition: A | Discharge status: Home / Self Care | Payer: Medicare HMO | Attending: Physician Assistant | Admitting: Physician Assistant

## 2023-05-12 DIAGNOSIS — H6123 Impacted cerumen, bilateral: Secondary | ICD-10-CM

## 2023-05-12 MED ORDER — CIPROFLOXACIN-DEXAMETHASONE 0.3-0.1 % OT SUSP: 4.0000 [drp] | Freq: Three times a day (TID) | OTIC | 0 refills | Status: AC

## 2023-05-12 MED ORDER — NEOMYCIN-POLYMYXIN-HC 3.5-10000-1 OT SOLN: OTIC | 0 refills | Status: AC

## 2023-05-12 MED ORDER — CARBAMIDE PEROXIDE 6.5 % OT SOLN
10.0000 [drp] | Freq: Once | OTIC | Status: AC
  Administered 2023-05-12: 10 [drp] via OTIC

## 2023-05-12 NOTE — ED Triage Notes (Signed)
Right ear pain since Saturday.  States he can't hear well out of that ear

## 2023-05-12 NOTE — ED Provider Notes (Signed)
[facility]    CSN: [id number] Arrival date & time: 05/12/23  0946      History   Chief Complaint No chief complaint on file.   HPI Curtis Lucas is a 62 y.o. male.   She complains of pain in his right ear.  Patient reports difficulty hearing out of both ears.  Patient denies any fever or chills he has not had any cough or congestion.  The history is provided by the patient. No language interpreter was used.    Past Medical History:  Diagnosis Date   Allergy    Rhinitis   Anxiety    Arthritis    Biceps rupture, distal, right, initial encounter    CVA (cerebral vascular accident) (HCC)    Depression    Difficulty sleeping    Elevated lipids    GERD (gastroesophageal reflux disease)    Hyperglycemia    Hyperlipidemia    Hypertension    Insomnia     Patient Active Problem List   Diagnosis Date Noted   Steatosis of liver 06/06/2020   Renal calculus, right 06/06/2020   Mixed hyperlipidemia 08/31/2019   Olecranon bursitis of left elbow 07/22/2017   Impingement syndrome of right shoulder 07/08/2017   Rupture of right distal biceps tendon 02/11/2017   Generalized anxiety disorder 01/20/2017   Prostate cancer screening 01/20/2017   Vitamin D deficiency 05/06/2015   Visual disturbance 05/02/2015   Insomnia    S/P right THA, AA 05/03/2012   Hypertension    GERD (gastroesophageal reflux disease)    CVA (cerebral vascular accident) (HCC)    Allergy    Elevated lipids    Hyperglycemia     Past Surgical History:  Procedure Laterality Date   COLONOSCOPY  09/24/2003   patterson--normal per pt.   DISTAL BICEPS TENDON REPAIR Right 02/24/2017   Procedure: RIGHT DISTAL BICEPS TENDON REPAIR;  Surgeon: Tarry Kos, MD;  Location: [facility];  Service: Orthopedics;  Laterality: Right;   HEAD INJURY   1987   JOINT REPLACEMENT     LUMBAR EPIDURAL INJECTION     RECTAL SURGERY  2006   TOTAL HIP ARTHROPLASTY     TOTAL HIP ARTHROPLASTY   05/03/2012   Procedure: TOTAL HIP ARTHROPLASTY ANTERIOR APPROACH;  Surgeon: Shelda Pal, MD;  Location: [facility];  Service: Orthopedics;  Laterality: Right;       Home Medications    Prior to Admission medications   Medication Sig Start Date End Date Taking? Authorizing Provider  neomycin-polymyxin-hydrocortisone (CORTISPORIN) OTIC solution 3 drops each ear for 5 days 05/12/23  Yes Elson Areas, PA-C  albuterol (VENTOLIN HFA) 108 (90 Base) MCG/ACT inhaler Inhale 2 puffs into the lungs every 6 (six) hours as needed for wheezing or shortness of breath. 10/28/21   Donita Brooks, MD  amLODipine (NORVASC) 10 MG tablet TAKE ONE TABLET BY MOUTH DAILY 02/09/23   Donita Brooks, MD  aspirin 81 MG tablet Take 81 mg by mouth daily.    [provider]  atenolol (TENORMIN) 100 MG tablet TAKE 1 TABLET BY MOUTH DAILY 03/24/23   Donita Brooks, MD  escitalopram (LEXAPRO) 20 MG tablet TAKE 1 TABLET BY MOUTH DAILY 03/24/23   Donita Brooks, MD  FLUZONE QUADRIVALENT 0.5 ML injection  10/16/22   [provider]  Omega-3 Fatty Acids (FISH OIL) 1200 MG CAPS Take 1 capsule by mouth at bedtime.     [provider]  pantoprazole (PROTONIX) 40 MG tablet  Take 1 tablet (40 mg total) by mouth daily. 01/20/23   Donita Brooks, MD  rosuvastatin (CRESTOR) 20 MG tablet TAKE 1 TABLET BY MOUTH DAILY 04/16/23   Donita Brooks, MD  [facility] injection  10/16/22   [provider]  traMADol (ULTRAM) 50 MG tablet TAKE 1 TABLET BY MOUTH EVERY 6 HOURS AS NEEDED 04/16/23   Donita Brooks, MD  zolpidem (AMBIEN) 10 MG tablet TAKE ONE TABLET BY MOUTH EVERY NIGHT AT BEDTIME AS NEEDED FOR SLEEP 02/22/23   Donita Brooks, MD    Family History Family History  Problem Relation Age of Onset   Stroke Mother    Cancer Mother        colon   Dementia Mother    Colon cancer Mother    Parkinson's disease Father    Heart attack Paternal Grandfather    Dementia Paternal Grandmother     Colon polyps Neg Hx    Esophageal cancer Neg Hx    Rectal cancer Neg Hx    Stomach cancer Neg Hx     Social History Social History   Tobacco Use   Smoking status: Never   Smokeless tobacco: Never  Substance Use Topics   Alcohol use: Yes    Comment: 4-5 BEERS PER WK   Drug use: Yes    Types: Marijuana     Allergies   Lipitor [atorvastatin calcium]   Review of Systems Review of Systems  All other systems reviewed and are negative.    Physical Exam Triage Vital Signs ED Triage Vitals  Enc Vitals Group     BP 05/12/23 0957 124/78     Pulse Rate 05/12/23 0957 63     Resp 05/12/23 0957 18     Temp 05/12/23 0957 98 F (36.7 C)     Temp Source 05/12/23 0957 Oral     SpO2 05/12/23 0957 93 %     Weight --      Height --      Head Circumference --      Peak Flow --      Pain Score 05/12/23 0959 9     Pain Loc --      Pain Edu? --      Excl. in GC? --    No data found.  Updated Vital Signs BP 124/78 (BP Location: Right Arm)   Pulse 63   Temp 98 F (36.7 C) (Oral)   Resp 18   SpO2 93%   Visual Acuity Right Eye Distance:   Left Eye Distance:   Bilateral Distance:    Right Eye Near:   Left Eye Near:    Bilateral Near:        UC Treatments / Results  Labs (all labs ordered are listed, but only abnormal results are displayed) Labs Reviewed - No data to display  EKG   Radiology No results found.  Procedures Procedures (including critical care time)  Medications Ordered in UC Medications  carbamide peroxide (DEBROX) 6.5 % OTIC (EAR) solution 10 drop (10 drops Both EARS Given 05/12/23 1033)    Initial Impression / Assessment and Plan / UC Course  I have reviewed the triage vital signs and the nursing notes.  Pertinent labs & imaging results that were available during my care of the patient were reviewed by me and considered in my medical decision making (see chart for details).    Bilateral ear canals irrigated, large amount of wax removed  bilateral ears, Final Clinical Impressions(s) /  UC Diagnoses   Final diagnoses:  Bilateral impacted cerumen   Discharge Instructions   None    ED Prescriptions     Medication Sig Dispense Auth. Provider   neomycin-polymyxin-hydrocortisone (CORTISPORIN) OTIC solution 3 drops each ear for 5 days 10 mL Elson Areas, [place]      PDMP not reviewed this encounter. An After Visit Summary was printed and given to the patient.       Elson Areas, [place] 05/12/23 1424

## 2023-05-13 ENCOUNTER — Ambulatory Visit: Payer: Medicare HMO | Admitting: Family Medicine

## 2023-05-13 NOTE — Telephone Encounter (Signed)
Rx change

## 2023-05-19 ENCOUNTER — Other Ambulatory Visit: Payer: Self-pay | Admitting: Family Medicine

## 2023-05-19 NOTE — Telephone Encounter (Signed)
Requested medication (s) are due for refill today: yes  Requested medication (s) are on the active medication list: yes  Last refill:  04/16/23 #90/0  Future visit scheduled: no  Notes to clinic:  Unable to refill per protocol, cannot delegate.     Requested Prescriptions  Pending Prescriptions Disp Refills   traMADol (ULTRAM) 50 MG tablet [Pharmacy Med Name: traMADol HCL 50MG  TABLET] 90 tablet     Sig: TAKE 1 TABLET BY MOUTH EVERY 6 HOURS AS NEEDED     Not Delegated - Analgesics:  Opioid Agonists Failed - 05/19/2023 10:56 AM      Failed - This refill cannot be delegated      Failed - Urine Drug Screen completed in last 360 days      Failed - Valid encounter within last 3 months    Recent Outpatient Visits           1 year ago Fever, unspecified fever cause   [facility] Donita Brooks, MD   1 year ago Viral URI with cough   [facility] Donita Brooks, MD   2 years ago Screening cholesterol level   [facility] Medicine Tanya Nones, Priscille Heidelberg, MD   2 years ago Tick bite, initial encounter   [facility] Medicine Lawson Fiscal A, FNP   2 years ago Right upper quadrant abdominal pain   [facility] Elmore Guise, FNP

## 2023-05-25 ENCOUNTER — Ambulatory Visit (INDEPENDENT_AMBULATORY_CARE_PROVIDER_SITE_OTHER): Payer: Medicare HMO | Admitting: Family Medicine

## 2023-05-25 VITALS — BP 120/76 | HR 67 | Temp 98.7°F | Ht 70.0 in | Wt 192.0 lb

## 2023-05-25 DIAGNOSIS — Z1211 Encounter for screening for malignant neoplasm of colon: Secondary | ICD-10-CM | POA: Diagnosis not present

## 2023-05-25 DIAGNOSIS — Z125 Encounter for screening for malignant neoplasm of prostate: Secondary | ICD-10-CM

## 2023-05-25 DIAGNOSIS — R739 Hyperglycemia, unspecified: Secondary | ICD-10-CM | POA: Diagnosis not present

## 2023-05-25 DIAGNOSIS — I10 Essential (primary) hypertension: Secondary | ICD-10-CM | POA: Diagnosis not present

## 2023-05-25 LAB — CBC WITH DIFFERENTIAL/PLATELET: Total Lymphocyte: 16.8 %

## 2023-05-25 MED ORDER — AMLODIPINE BESYLATE 10 MG PO TABS: 10.0000 mg | ORAL_TABLET | Freq: Every day | ORAL | 2 refills | Status: DC

## 2023-05-25 NOTE — Progress Notes (Signed)
Subjective:  HPI: Curtis Lucas is a 62 y.o. male presenting on 05/25/2023 for Follow-up (Pt here for follow up on Amlodipine and Tramadol. )   HPI Patient is in today for medication refills of his Amlodipine. He reports his BP is well controlled at home <130/90 and denies chest pain, shortness of breath, vision changes, recurrent headaches, or swelling of legs. No medication side effects and he reports compliance. He has not had a physical in a year so we reviewed his health maintenance items. I will order a colonoscopy as he is due. We discussed the shingles vaccine and he declines at this time. Will obtain routine fasting labs with PSA today.  Review of Systems  All other systems reviewed and are negative.   Relevant past medical history reviewed and updated as indicated.   Past Medical History:  Diagnosis Date   Allergy    Rhinitis   Anxiety    Arthritis    Biceps rupture, distal, right, initial encounter    CVA (cerebral vascular accident) (HCC)    Depression    Difficulty sleeping    Elevated lipids    GERD (gastroesophageal reflux disease)    Hyperglycemia    Hyperlipidemia    Hypertension    Insomnia      Past Surgical History:  Procedure Laterality Date   COLONOSCOPY  09/24/2003   patterson--normal per pt.   DISTAL BICEPS TENDON REPAIR Right 02/24/2017   Procedure: RIGHT DISTAL BICEPS TENDON REPAIR;  Surgeon: Tarry Kos, MD;  Location: [facility];  Service: Orthopedics;  Laterality: Right;   HEAD INJURY   1987   JOINT REPLACEMENT     LUMBAR EPIDURAL INJECTION     RECTAL SURGERY  2006   TOTAL HIP ARTHROPLASTY     TOTAL HIP ARTHROPLASTY  05/03/2012   Procedure: TOTAL HIP ARTHROPLASTY ANTERIOR APPROACH;  Surgeon: Shelda Pal, MD;  Location: [facility];  Service: Orthopedics;  Laterality: Right;    Allergies and medications reviewed and updated.   Current Outpatient Medications:    albuterol (VENTOLIN HFA) 108 (90 Base) MCG/ACT inhaler, Inhale  2 puffs into the lungs every 6 (six) hours as needed for wheezing or shortness of breath., Disp: 8 g, Rfl: 0   aspirin 81 MG tablet, Take 81 mg by mouth daily., Disp: , Rfl:    atenolol (TENORMIN) 100 MG tablet, TAKE 1 TABLET BY MOUTH DAILY, Disp: 90 tablet, Rfl: 0   escitalopram (LEXAPRO) 20 MG tablet, TAKE 1 TABLET BY MOUTH DAILY, Disp: 90 tablet, Rfl: 0   FLUZONE QUADRIVALENT 0.5 ML injection, , Disp: , Rfl:    neomycin-polymyxin-hydrocortisone (CORTISPORIN) OTIC solution, 3 drops each ear for 5 days, Disp: 10 mL, Rfl: 0   Omega-3 Fatty Acids (FISH OIL) 1200 MG CAPS, Take 1 capsule by mouth at bedtime. , Disp: , Rfl:    pantoprazole (PROTONIX) 40 MG tablet, Take 1 tablet (40 mg total) by mouth daily., Disp: 90 tablet, Rfl: 1   rosuvastatin (CRESTOR) 20 MG tablet, TAKE 1 TABLET BY MOUTH DAILY, Disp: 90 tablet, Rfl: 0   SPIKEVAX injection, , Disp: , Rfl:    traMADol (ULTRAM) 50 MG tablet, TAKE 1 TABLET BY MOUTH EVERY 6 HOURS AS NEEDED, Disp: 90 tablet, Rfl: 0   zolpidem (AMBIEN) 10 MG tablet, TAKE ONE TABLET BY MOUTH EVERY NIGHT AT BEDTIME AS NEEDED FOR SLEEP, Disp: 30 tablet, Rfl: 3   amLODipine (NORVASC) 10 MG tablet, Take 1 tablet (10 mg total) by mouth daily.,  Disp: 90 tablet, Rfl: 2  Allergies  Allergen Reactions   Lipitor [Atorvastatin Calcium]     Severe muscle spasms    Objective:   BP 120/76   Pulse 67   Temp 98.7 F (37.1 C)   Ht 5\' 10"  (1.778 m)   Wt 192 lb (87.1 kg)   SpO2 97%   BMI 27.55 kg/m      05/25/2023   11:48 AM 05/12/2023    9:57 AM 01/05/2023   10:30 AM  Vitals with BMI  Height 5\' 10"   5\' 10"   Weight 192 lbs  200 lbs  BMI 27.55  28.7  Systolic 120 124   Diastolic 76 78   Pulse 67 63      Physical Exam Vitals and nursing note reviewed.  Constitutional:      Appearance: Normal appearance. He is normal weight.  HENT:     Head: Normocephalic and atraumatic.  Cardiovascular:     Rate and Rhythm: Normal rate and regular rhythm.     Pulses: Normal  pulses.     Heart sounds: Normal heart sounds.  Pulmonary:     Effort: Pulmonary effort is normal.     Breath sounds: Normal breath sounds.  Skin:    General: Skin is warm and dry.     Capillary Refill: Capillary refill takes less than 2 seconds.  Neurological:     General: No focal deficit present.     Mental Status: He is alert and oriented to person, place, and time. Mental status is at baseline.  Psychiatric:        Mood and Affect: Mood normal.        Behavior: Behavior normal.        Thought Content: Thought content normal.        Judgment: Judgment normal.     Assessment & Plan:  Primary hypertension Assessment & Plan: Chronic. Well controlled on Amlodipine 10mg  daily and Atenolol 100mg  daily. He is also on ASA, crestor, and fish oil. Seek medical care if BP sustains >130/80 or for chest pain, shortness of breath, recurrent headaches, vision changes, or swelling of extremities. Continue heart healthy diet and 150 minutes of moderate intensity exercise weekly. Follow up in 6 months.  Orders: -     CBC with Differential/Platelet -     COMPLETE METABOLIC PANEL WITH GFR -     Lipid panel  Colon cancer screening -     Ambulatory referral to Gastroenterology  Prostate cancer screening -     PSA  Other orders -     amLODIPine Besylate; Take 1 tablet (10 mg total) by mouth daily.  Dispense: 90 tablet; Refill: 2     Follow up plan: Return in about 6 months (around 11/24/2023) for chronic follow-up.  Park Meo, FNP

## 2023-05-25 NOTE — Assessment & Plan Note (Signed)
Chronic. Well controlled on Amlodipine 10mg  daily and Atenolol 100mg  daily. He is also on ASA, crestor, and fish oil. Seek medical care if BP sustains >130/80 or for chest pain, shortness of breath, recurrent headaches, vision changes, or swelling of extremities. Continue heart healthy diet and 150 minutes of moderate intensity exercise weekly. Follow up in 6 months.

## 2023-05-26 LAB — CBC WITH DIFFERENTIAL/PLATELET
Basophils Absolute: 34 cells/uL (ref 0–200)
Basophils Relative: 0.4 %
MCH: 30.2 pg (ref 27.0–33.0)
Neutro Abs: 6140 cells/uL (ref 1500–7800)
WBC: 8.4 10*3/uL (ref 3.8–10.8)

## 2023-05-26 LAB — COMPLETE METABOLIC PANEL WITH GFR
AST: 17 U/L (ref 10–35)
Calcium: 9.1 mg/dL (ref 8.6–10.3)
Globulin: 3.2 g/dL (calc) (ref 1.9–3.7)
Glucose, Bld: 113 mg/dL — ABNORMAL HIGH (ref 65–99)

## 2023-05-27 ENCOUNTER — Encounter: Payer: Self-pay | Admitting: Gastroenterology

## 2023-05-27 LAB — CBC WITH DIFFERENTIAL/PLATELET
Eosinophils Absolute: 168 cells/uL (ref 15–500)
Hemoglobin: 14.6 g/dL (ref 13.2–17.1)
MCHC: 32.7 g/dL (ref 32.0–36.0)
Neutrophils Relative %: 73.1 %
RBC: 4.84 10*6/uL (ref 4.20–5.80)
RDW: 11.6 % (ref 11.0–15.0)

## 2023-05-27 LAB — COMPLETE METABOLIC PANEL WITH GFR
Chloride: 103 mmol/L (ref 98–110)
eGFR: 101 mL/min/{1.73_m2} (ref >=60)

## 2023-05-27 LAB — TEST AUTHORIZATION

## 2023-05-27 LAB — LIPID PANEL
Cholesterol: 164 mg/dL (ref <200)
HDL: 62 mg/dL (ref >=40)
LDL Cholesterol (Calc): 85 mg/dL (calc)

## 2023-05-28 LAB — LIPID PANEL
Non-HDL Cholesterol (Calc): 102 mg/dL (calc) (ref <130)
Total CHOL/HDL Ratio: 2.6 (calc) (ref <5.0)
Triglycerides: 84 mg/dL (ref <150)

## 2023-05-28 LAB — HEMOGLOBIN A1C
Hgb A1c MFr Bld: 5.6 % of total Hgb (ref <5.7)
Mean Plasma Glucose: 114 mg/dL
eAG (mmol/L): 6.3 mmol/L

## 2023-05-28 LAB — COMPLETE METABOLIC PANEL WITH GFR
AG Ratio: 1.2 (calc) (ref 1.0–2.5)
ALT: 19 U/L (ref 9–46)
Albumin: 3.9 g/dL (ref 3.6–5.1)
Alkaline phosphatase (APISO): 76 U/L (ref 35–144)
BUN: 8 mg/dL (ref 7–25)
CO2: 24 mmol/L (ref 20–32)
Creat: 0.75 mg/dL (ref 0.70–1.35)
Potassium: 5.1 mmol/L (ref 3.5–5.3)
Sodium: 142 mmol/L (ref 135–146)
Total Bilirubin: 0.4 mg/dL (ref 0.2–1.2)
Total Protein: 7.1 g/dL (ref 6.1–8.1)

## 2023-05-28 LAB — TEST AUTHORIZATION

## 2023-05-28 LAB — CBC WITH DIFFERENTIAL/PLATELET
Absolute Monocytes: 647 cells/uL (ref 200–950)
Eosinophils Relative: 2 %
HCT: 44.6 % (ref 38.5–50.0)
Lymphs Abs: 1411 cells/uL (ref 850–3900)
MCV: 92.1 fL (ref 80.0–100.0)
MPV: 10.8 fL (ref 7.5–12.5)
Monocytes Relative: 7.7 %
Platelets: 295 10*3/uL (ref 140–400)

## 2023-05-28 LAB — PSA: PSA: 0.67 ng/mL (ref <=4.00)

## 2023-05-31 ENCOUNTER — Telehealth: Payer: Self-pay

## 2023-05-31 NOTE — Telephone Encounter (Signed)
Pt called in to discuss his recent lab results. Please advise  Cb#: [phone]

## 2023-06-18 ENCOUNTER — Other Ambulatory Visit: Payer: Self-pay | Admitting: Family Medicine

## 2023-06-21 NOTE — Telephone Encounter (Signed)
Patient called to follow up on refill status for traMADol Janean Sark) 50 MG tablet   Pharmacy confirmed as   [facility] [id number] - [place], [place] - [address], [place] [address] Phone: [phone]  Fax: [phone]   Please advise at [phone].

## 2023-07-08 ENCOUNTER — Encounter: Payer: Medicare HMO | Admitting: Gastroenterology

## 2023-07-11 ENCOUNTER — Other Ambulatory Visit: Payer: Self-pay | Admitting: Family Medicine

## 2023-07-14 ENCOUNTER — Other Ambulatory Visit: Payer: Self-pay | Admitting: Family Medicine

## 2023-07-21 ENCOUNTER — Other Ambulatory Visit: Payer: Self-pay | Admitting: Family Medicine

## 2023-08-23 ENCOUNTER — Other Ambulatory Visit: Payer: Self-pay | Admitting: Family Medicine

## 2023-08-25 NOTE — Telephone Encounter (Signed)
Requested medication (s) are due for refill today: routing for approval  Requested medication (s) are on the active medication list: yes  Last refill:  02/22/23  Future visit scheduled: no  Notes to clinic:  Unable to refill per protocol, cannot delegate.      Requested Prescriptions  Pending Prescriptions Disp Refills   zolpidem (AMBIEN) 10 MG tablet [Pharmacy Med Name: ZOLPIDEM TARTRATE 10 MG TABLET] 30 tablet     Sig: TAKE 1 TABLET BY MOUTH EVERY NIGHT AT BEDTIME AS NEEDED FOR SLEEP     Not Delegated - Psychiatry:  Anxiolytics/Hypnotics Failed - 08/23/2023 12:03 PM      Failed - This refill cannot be delegated      Failed - Urine Drug Screen completed in last 360 days      Failed - Valid encounter within last 6 months    Recent Outpatient Visits           1 year ago Fever, unspecified fever cause   [facility] Donita Brooks, MD   1 year ago Viral URI with cough   [facility] Donita Brooks, MD   2 years ago Screening cholesterol level   [facility] Tanya Nones, Priscille Heidelberg, MD   3 years ago Tick bite, initial encounter   [facility] Lawson Fiscal A, FNP   3 years ago Right upper quadrant abdominal pain   [facility] Elmore Guise, FNP

## 2023-09-11 ENCOUNTER — Other Ambulatory Visit: Payer: Self-pay | Admitting: Family Medicine

## 2023-09-14 NOTE — Telephone Encounter (Signed)
Requested medication (s) are due for refill today -yes  Requested medication (s) are on the active medication list -yes  Future visit scheduled -no  Last refill: 07/22/23 #90  Notes to clinic: non delegated Rx  Requested Prescriptions  Pending Prescriptions Disp Refills   traMADol (ULTRAM) 50 MG tablet [Pharmacy Med Name: traMADol HCL 50MG  TABLET] 90 tablet     Sig: TAKE 1 TABLET BY MOUTH EVERY 6 HOURS AS NEEDED     Not Delegated - Analgesics:  Opioid Agonists Failed - 09/11/2023  5:36 PM      Failed - This refill cannot be delegated      Failed - Urine Drug Screen completed in last 360 days      Failed - Valid encounter within last 3 months    Recent Outpatient Visits           1 year ago Fever, unspecified fever cause   [facility] Donita Brooks, MD   1 year ago Viral URI with cough   [facility] Tanya Nones, Priscille Heidelberg, MD   2 years ago Screening cholesterol level   [facility] Tanya Nones, Priscille Heidelberg, MD   3 years ago Tick bite, initial encounter   [facility] Lawson Fiscal A, FNP   3 years ago Right upper quadrant abdominal pain   [facility] Jenne Pane, [place] A, FNP                 Requested Prescriptions  Pending Prescriptions Disp Refills   traMADol (ULTRAM) 50 MG tablet [Pharmacy Med Name: traMADol HCL 50MG  TABLET] 90 tablet     Sig: TAKE 1 TABLET BY MOUTH EVERY 6 HOURS AS NEEDED     Not Delegated - Analgesics:  Opioid Agonists Failed - 09/11/2023  5:36 PM      Failed - This refill cannot be delegated      Failed - Urine Drug Screen completed in last 360 days      Failed - Valid encounter within last 3 months    Recent Outpatient Visits           1 year ago Fever, unspecified fever cause   [facility] Donita Brooks, MD   1 year ago Viral URI with cough   [facility] Donita Brooks, MD   2 years ago Screening cholesterol level   [facility] Tanya Nones, Priscille Heidelberg, MD   3 years ago Tick bite, initial encounter   [facility] Lawson Fiscal A, FNP   3 years ago Right upper quadrant abdominal pain   [facility] Elmore Guise, FNP

## 2023-09-17 NOTE — Telephone Encounter (Signed)
Received refill request reminder from pharmacy.  LOV 05/13/23. Patient has med refill appointment scheduled for Tuesday, 09/21/23.  Pharmacy:  [facility] [id number] [place], [place] - [address] [place] [address] Phone: [phone]  Fax: [phone]    Please advise pharmacist.

## 2023-09-21 ENCOUNTER — Ambulatory Visit (INDEPENDENT_AMBULATORY_CARE_PROVIDER_SITE_OTHER): Payer: Medicare HMO | Admitting: Family Medicine

## 2023-09-21 VITALS — BP 130/72 | HR 63 | Temp 98.7°F | Ht 70.0 in | Wt 198.6 lb

## 2023-09-21 DIAGNOSIS — I10 Essential (primary) hypertension: Secondary | ICD-10-CM | POA: Diagnosis not present

## 2023-09-21 DIAGNOSIS — R739 Hyperglycemia, unspecified: Secondary | ICD-10-CM | POA: Diagnosis not present

## 2023-09-21 DIAGNOSIS — E7800 Pure hypercholesterolemia, unspecified: Secondary | ICD-10-CM

## 2023-09-21 DIAGNOSIS — M79601 Pain in right arm: Secondary | ICD-10-CM

## 2023-09-21 DIAGNOSIS — G8929 Other chronic pain: Secondary | ICD-10-CM | POA: Diagnosis not present
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
## 2023-09-24 ENCOUNTER — Other Ambulatory Visit: Payer: Self-pay | Admitting: Family Medicine

## 2023-09-29 ENCOUNTER — Other Ambulatory Visit: Payer: Self-pay | Admitting: Family Medicine

## 2023-09-29 NOTE — Telephone Encounter (Signed)
Last OV 09/21/23 Requested Prescriptions  Pending Prescriptions Disp Refills   rosuvastatin (CRESTOR) 20 MG tablet [Pharmacy Med Name: ROSUVASTATIN CALCIUM 20 MG TAB] 90 tablet 0    Sig: TAKE 1 TABLET BY MOUTH DAILY     Cardiovascular:  Antilipid - Statins 2 Failed - 09/29/2023  6:21 AM      Failed - Valid encounter within last 12 months    Recent Outpatient Visits           1 year ago Fever, unspecified fever cause   [facility] Donita Brooks, MD   1 year ago Viral URI with cough   [facility] Donita Brooks, MD   2 years ago Screening cholesterol level   [facility] Tanya Nones, Priscille Heidelberg, MD   3 years ago Tick bite, initial encounter   [facility] Lawson Fiscal A, FNP   3 years ago Right upper quadrant abdominal pain   [facility] Jenne Pane, Crystal A, FNP              Failed - Lipid Panel in normal range within the last 12 months    Cholesterol  Date Value Ref Range Status  05/25/2023 164 <200 mg/dL Final   LDL Cholesterol (Calc)  Date Value Ref Range Status  05/25/2023 85 mg/dL (calc) Final    Comment:    Reference range: <100 . Desirable range <100 mg/dL for primary prevention;   <70 mg/dL for patients with CHD or diabetic patients  with > or = 2 CHD risk factors. [name] LDL-C is now calculated using the Martin-Hopkins  calculation, which is a validated novel method providing  better accuracy than the Friedewald equation in the  estimation of LDL-C.  Horald Pollen et al. Lenox Ahr. 6045;409(81): 2061-2068  (http://education.[facility].com/faq/FAQ164)    HDL  Date Value Ref Range Status  05/25/2023 62 > OR = 40 mg/dL Final   Triglycerides  Date Value Ref Range Status  05/25/2023 84 <150 mg/dL Final         Passed - Cr in normal range and within 360 days    Creat  Date Value Ref Range Status  05/25/2023 0.75 0.70 - 1.35 mg/dL Final         Passed - Patient is not pregnant

## 2023-10-05 ENCOUNTER — Other Ambulatory Visit: Payer: Self-pay | Admitting: Family Medicine

## 2023-10-05 NOTE — Telephone Encounter (Signed)
Requested medication (s) are due for refill today - unsure  Requested medication (s) are on the active medication list -yes  Future visit scheduled -no  Last refill: 09/17/23 #90   Notes to clinic: non delegated Rx  Requested Prescriptions  Pending Prescriptions Disp Refills   traMADol (ULTRAM) 50 MG tablet [Pharmacy Med Name: traMADol HCL 50MG  TABLET] 90 tablet     Sig: TAKE 1 TABLET BY MOUTH EVERY 6 HOURS AS NEEDED     Not Delegated - Analgesics:  Opioid Agonists Failed - 10/05/2023  9:50 AM      Failed - This refill cannot be delegated      Failed - Urine Drug Screen completed in last 360 days      Failed - Valid encounter within last 3 months    Recent Outpatient Visits           1 year ago Fever, unspecified fever cause   [facility] Medicine Donita Brooks, MD   2 years ago Viral URI with cough   [facility] Tanya Nones, Priscille Heidelberg, MD   2 years ago Screening cholesterol level   [facility] Tanya Nones, Priscille Heidelberg, MD   3 years ago Tick bite, initial encounter   [facility] Lawson Fiscal A, FNP   3 years ago Right upper quadrant abdominal pain   [facility] Jenne Pane, [place] A, FNP                 Requested Prescriptions  Pending Prescriptions Disp Refills   traMADol (ULTRAM) 50 MG tablet [Pharmacy Med Name: traMADol HCL 50MG  TABLET] 90 tablet     Sig: TAKE 1 TABLET BY MOUTH EVERY 6 HOURS AS NEEDED     Not Delegated - Analgesics:  Opioid Agonists Failed - 10/05/2023  9:50 AM      Failed - This refill cannot be delegated      Failed - Urine Drug Screen completed in last 360 days      Failed - Valid encounter within last 3 months    Recent Outpatient Visits           1 year ago Fever, unspecified fever cause   [facility] Donita Brooks, MD   2 years ago Viral URI with cough   [facility] Donita Brooks, MD   2 years ago Screening cholesterol  level   [facility] Tanya Nones, Priscille Heidelberg, MD   3 years ago Tick bite, initial encounter   [facility] Lawson Fiscal A, FNP   3 years ago Right upper quadrant abdominal pain   [facility] Elmore Guise, FNP

## 2023-10-12 NOTE — Telephone Encounter (Signed)
Pharmacy sent efax to follow up on refill requested for  traMADol (ULTRAM) 50 MG tablet .  LOV: 09/21/23  Pharmacy:  [facility] [id number] [place], [place] - [address] [place] [address] Phone: [phone]  Fax: [phone]    Please advise pharmacist.

## 2023-10-18 ENCOUNTER — Other Ambulatory Visit: Payer: Self-pay | Admitting: Family Medicine

## 2023-10-19 NOTE — Telephone Encounter (Signed)
Due to a system glitch the last office visit for this practice is not detected correctly.    LOV 09/21/2023.  All criteria met.  Requested Prescriptions  Pending Prescriptions Disp Refills   atenolol (TENORMIN) 100 MG tablet [Pharmacy Med Name: ATENOLOL 100 MG TABLET] 90 tablet 3    Sig: TAKE 1 TABLET BY MOUTH DAILY     Cardiovascular: Beta Blockers 2 Failed - 10/18/2023  6:21 AM      Failed - Valid encounter within last 6 months    Recent Outpatient Visits           1 year ago Fever, unspecified fever cause   [facility] Medicine Donita Brooks, MD   2 years ago Viral URI with cough   [facility] Medicine Donita Brooks, MD   2 years ago Screening cholesterol level   [facility] Medicine Tanya Nones, Priscille Heidelberg, MD   3 years ago Tick bite, initial encounter   [facility] Lawson Fiscal A, FNP   3 years ago Right upper quadrant abdominal pain   [facility] Medicine Jenne Pane, Rocky Crafts, FNP              Passed - Cr in normal range and within 360 days    Creat  Date Value Ref Range Status  05/25/2023 0.75 0.70 - 1.35 mg/dL Final         Passed - Last BP in normal range    BP Readings from Last 1 Encounters:  09/21/23 130/72         Passed - Last Heart Rate in normal range    Pulse Readings from Last 1 Encounters:  09/21/23 63

## 2023-10-28 ENCOUNTER — Other Ambulatory Visit: Payer: Self-pay | Admitting: Family Medicine

## 2023-10-29 NOTE — Telephone Encounter (Signed)
Requested medication (s) are due for refill today - yes  Requested medication (s) are on the active medication list -yes  Future visit scheduled -no  Last refill: 09/24/23 #30  Notes to clinic: non delegated Rx  Requested Prescriptions  Pending Prescriptions Disp Refills   zolpidem (AMBIEN) 10 MG tablet [Pharmacy Med Name: ZOLPIDEM TARTRATE 10 MG TABLET] 30 tablet     Sig: TAKE 1 TABLET BY MOUTH EVERY NIGHT AT BEDTIME AS NEEDED FOR SLEEP     Not Delegated - Psychiatry:  Anxiolytics/Hypnotics Failed - 10/28/2023  6:23 PM      Failed - This refill cannot be delegated      Failed - Urine Drug Screen completed in last 360 days      Failed - Valid encounter within last 6 months    Recent Outpatient Visits           2 years ago Fever, unspecified fever cause   [facility] Donita Brooks, MD   2 years ago Viral URI with cough   [facility] Tanya Nones, Priscille Heidelberg, MD   2 years ago Screening cholesterol level   [facility] Tanya Nones, Priscille Heidelberg, MD   3 years ago Tick bite, initial encounter   [facility] Lawson Fiscal A, FNP   3 years ago Right upper quadrant abdominal pain   [facility] Jenne Pane, Rocky Crafts, FNP                 Requested Prescriptions  Pending Prescriptions Disp Refills   zolpidem (AMBIEN) 10 MG tablet [Pharmacy Med Name: ZOLPIDEM TARTRATE 10 MG TABLET] 30 tablet     Sig: TAKE 1 TABLET BY MOUTH EVERY NIGHT AT BEDTIME AS NEEDED FOR SLEEP     Not Delegated - Psychiatry:  Anxiolytics/Hypnotics Failed - 10/28/2023  6:23 PM      Failed - This refill cannot be delegated      Failed - Urine Drug Screen completed in last 360 days      Failed - Valid encounter within last 6 months    Recent Outpatient Visits           2 years ago Fever, unspecified fever cause   [facility] Medicine Donita Brooks, MD   2 years ago Viral URI with cough   [facility]  Donita Brooks, MD   2 years ago Screening cholesterol level   [facility] Tanya Nones, Priscille Heidelberg, MD   3 years ago Tick bite, initial encounter   [facility] Lawson Fiscal A, FNP   3 years ago Right upper quadrant abdominal pain   [facility] Elmore Guise, FNP

## 2023-11-03 ENCOUNTER — Other Ambulatory Visit: Payer: Self-pay | Admitting: Family Medicine

## 2023-11-03 DIAGNOSIS — F411 Generalized anxiety disorder: Secondary | ICD-10-CM

## 2023-11-04 NOTE — Telephone Encounter (Signed)
Second request for zolpidem (AMBIEN) 10 MG tablet

## 2023-11-04 NOTE — Telephone Encounter (Signed)
Requested medication (s) are due for refill today:yes  Requested medication (s) are on the active medication list: yes  Last refill:  03/24/23 #90  Future visit scheduled: no  Notes to clinic:  cannot find last OV regarding this med - pt has not had a refill since june   Requested Prescriptions  Pending Prescriptions Disp Refills   escitalopram (LEXAPRO) 20 MG tablet [Pharmacy Med Name: ESCITALOPRAM 20 MG TABLET] 90 tablet 0    Sig: TAKE 1 TABLET BY MOUTH DAILY     Psychiatry:  Antidepressants - SSRI Failed - 11/03/2023  6:21 AM      Failed - Valid encounter within last 6 months    Recent Outpatient Visits           2 years ago Fever, unspecified fever cause   [facility] Medicine Donita Brooks, MD   2 years ago Viral URI with cough   [facility] Donita Brooks, MD   2 years ago Screening cholesterol level   [facility] Tanya Nones, Priscille Heidelberg, MD   3 years ago Tick bite, initial encounter   [facility] Lawson Fiscal A, FNP   3 years ago Right upper quadrant abdominal pain   [facility] Elmore Guise, FNP

## 2023-11-09 ENCOUNTER — Other Ambulatory Visit: Payer: Self-pay

## 2023-11-09 ENCOUNTER — Telehealth: Payer: Self-pay

## 2023-11-09 DIAGNOSIS — F411 Generalized anxiety disorder: Secondary | ICD-10-CM

## 2023-11-09 MED ORDER — ESCITALOPRAM OXALATE 20 MG PO TABS
20.0000 mg | ORAL_TABLET | Freq: Every day | ORAL | 1 refills | Status: DC
Start: 2023-11-09 — End: 2024-05-08

## 2023-11-09 NOTE — Telephone Encounter (Signed)
Prescription Request  11/09/2023  LOV: 09/21/23  What is the name of the medication or equipment? escitalopram (LEXAPRO) 20 MG tablet [[id number]]   Have you contacted your pharmacy to request a refill? Yes   Which pharmacy would you like this sent to?  [name] [id number] [place], [place] - [address] [place] [address] Phone: [phone] Fax: [phone]    Patient notified that their request is being sent to the clinical staff for review and that they should receive a response within 2 business days.   Please advise at [facility] [phone]

## 2023-11-09 NOTE — Telephone Encounter (Signed)
Prescription Request  11/09/2023  LOV: 09/21/23  What is the name of the medication or equipment? zolpidem (AMBIEN) 10 MG tablet [[id number]]   Have you contacted your pharmacy to request a refill? Yes   Which pharmacy would you like this sent to?  [name] [id number] [place], [place] - [address] [place] [address] Phone: [phone] Fax: [phone]    Patient notified that their request is being sent to the clinical staff for review and that they should receive a response within 2 business days.   Please advise at [facility] [phone]

## 2023-11-22 ENCOUNTER — Other Ambulatory Visit: Payer: Self-pay | Admitting: Family Medicine

## 2023-11-23 ENCOUNTER — Other Ambulatory Visit: Payer: Self-pay | Admitting: Family Medicine

## 2023-11-23 NOTE — Telephone Encounter (Signed)
Requested medication (s) are due for refill today - yes  Requested medication (s) are on the active medication list -yes  Future visit scheduled -no  Last refill: 09/24/23 #30  Notes to clinic: non delegated Rx  Requested Prescriptions  Pending Prescriptions Disp Refills   zolpidem (AMBIEN) 10 MG tablet 30 tablet 0    Sig: Take 1 tablet (10 mg total) by mouth at bedtime as needed. for sleep     Not Delegated - Psychiatry:  Anxiolytics/Hypnotics Failed - 11/23/2023  1:23 PM      Failed - This refill cannot be delegated      Failed - Urine Drug Screen completed in last 360 days      Failed - Valid encounter within last 6 months    Recent Outpatient Visits           2 years ago Fever, unspecified fever cause   [facility] Medicine Tanya Nones, Priscille Heidelberg, MD   2 years ago Viral URI with cough   [facility] Donita Brooks, MD   2 years ago Screening cholesterol level   [facility] Tanya Nones, Priscille Heidelberg, MD   3 years ago Tick bite, initial encounter   [facility] Lawson Fiscal A, FNP   3 years ago Right upper quadrant abdominal pain   [facility] Jenne Pane, [place] A, FNP                 Requested Prescriptions  Pending Prescriptions Disp Refills   zolpidem (AMBIEN) 10 MG tablet 30 tablet 0    Sig: Take 1 tablet (10 mg total) by mouth at bedtime as needed. for sleep     Not Delegated - Psychiatry:  Anxiolytics/Hypnotics Failed - 11/23/2023  1:23 PM      Failed - This refill cannot be delegated      Failed - Urine Drug Screen completed in last 360 days      Failed - Valid encounter within last 6 months    Recent Outpatient Visits           2 years ago Fever, unspecified fever cause   [facility] Donita Brooks, MD   2 years ago Viral URI with cough   [facility] Donita Brooks, MD   2 years ago Screening cholesterol level   [facility]  Tanya Nones, Priscille Heidelberg, MD   3 years ago Tick bite, initial encounter   [facility] Lawson Fiscal A, FNP   3 years ago Right upper quadrant abdominal pain   [facility] Elmore Guise, FNP

## 2023-11-23 NOTE — Telephone Encounter (Signed)
Prescription Request  11/23/2023  LOV: 09/21/2023  What is the name of the medication or equipment? zolpidem (AMBIEN) 10 MG tablet   Have you contacted your pharmacy to request a refill? Yes   Which pharmacy would you like this sent to?  [name] [id number] [place], [place] - [address] [place] [address] Phone: [phone] Fax: [phone]    Patient notified that their request is being sent to the clinical staff for review and that they should receive a response within 2 business days.   Please advise at [facility] [phone]

## 2023-11-25 NOTE — Telephone Encounter (Signed)
Requested Prescriptions  Pending Prescriptions Disp Refills   traMADol (ULTRAM) 50 MG tablet [Pharmacy Med Name: traMADol HCL 50MG  TABLET] 90 tablet     Sig: TAKE 1 TABLET BY MOUTH EVERY 6 HOURS AS NEEDED     Not Delegated - Analgesics:  Opioid Agonists Failed - 11/22/2023 10:40 AM      Failed - This refill cannot be delegated      Failed - Urine Drug Screen completed in last 360 days      Failed - Valid encounter within last 3 months    Recent Outpatient Visits           2 years ago Fever, unspecified fever cause   [facility] Donita Brooks, MD   2 years ago Viral URI with cough   [facility] Tanya Nones, Priscille Heidelberg, MD   2 years ago Screening cholesterol level   [facility] Tanya Nones, Priscille Heidelberg, MD   3 years ago Tick bite, initial encounter   [facility] Medicine Lawson Fiscal A, FNP   3 years ago Right upper quadrant abdominal pain   [facility] Jenne Pane, [place] A, FNP               pantoprazole (PROTONIX) 40 MG tablet [Pharmacy Med Name: PANTOPRAZOLE SOD DR 40 MG TAB] 90 tablet 1    Sig: TAKE 1 TABLET BY MOUTH DAILY     Gastroenterology: Proton Pump Inhibitors Failed - 11/22/2023 10:40 AM      Failed - Valid encounter within last 12 months    Recent Outpatient Visits           2 years ago Fever, unspecified fever cause   [facility] Donita Brooks, MD   2 years ago Viral URI with cough   [facility] Donita Brooks, MD   2 years ago Screening cholesterol level   [facility] Tanya Nones, Priscille Heidelberg, MD   3 years ago Tick bite, initial encounter   [facility] Medicine Lawson Fiscal A, FNP   3 years ago Right upper quadrant abdominal pain   [facility] Medicine Elmore Guise, FNP

## 2023-11-25 NOTE — Telephone Encounter (Signed)
Requested medications are due for refill today.  yes  Requested medications are on the active medications list.  yes  Last refill. 10/21/2023 #90 0 rf  Future visit scheduled.   no  Notes to clinic.  Refill not delegated.    Requested Prescriptions  Pending Prescriptions Disp Refills   traMADol (ULTRAM) 50 MG tablet [Pharmacy Med Name: traMADol HCL 50MG  TABLET] 90 tablet     Sig: TAKE 1 TABLET BY MOUTH EVERY 6 HOURS AS NEEDED     Not Delegated - Analgesics:  Opioid Agonists Failed - 11/22/2023 10:40 AM      Failed - This refill cannot be delegated      Failed - Urine Drug Screen completed in last 360 days      Failed - Valid encounter within last 3 months    Recent Outpatient Visits           2 years ago Fever, unspecified fever cause   [facility] Medicine Donita Brooks, MD   2 years ago Viral URI with cough   [facility] Tanya Nones, Priscille Heidelberg, MD   2 years ago Screening cholesterol level   [facility] Tanya Nones, Priscille Heidelberg, MD   3 years ago Tick bite, initial encounter   [facility] Medicine Lawson Fiscal A, FNP   3 years ago Right upper quadrant abdominal pain   [facility] Jenne Pane, [place] A, FNP              Signed Prescriptions Disp Refills   pantoprazole (PROTONIX) 40 MG tablet 90 tablet 3    Sig: TAKE 1 TABLET BY MOUTH DAILY     Gastroenterology: Proton Pump Inhibitors Failed - 11/22/2023 10:40 AM      Failed - Valid encounter within last 12 months    Recent Outpatient Visits           2 years ago Fever, unspecified fever cause   [facility] Medicine Donita Brooks, MD   2 years ago Viral URI with cough   [facility] Donita Brooks, MD   2 years ago Screening cholesterol level   [facility] Tanya Nones, Priscille Heidelberg, MD   3 years ago Tick bite, initial encounter   [facility] Medicine Lawson Fiscal A, FNP   3 years ago Right upper quadrant  abdominal pain   [facility] Elmore Guise, FNP

## 2023-11-29 ENCOUNTER — Telehealth: Payer: Self-pay

## 2023-11-29 ENCOUNTER — Other Ambulatory Visit: Payer: Self-pay | Admitting: Family Medicine

## 2023-11-29 MED ORDER — TRAMADOL HCL 50 MG PO TABS: 50.0000 mg | ORAL_TABLET | Freq: Four times a day (QID) | ORAL | 0 refills | Status: DC | PRN

## 2023-11-29 NOTE — Telephone Encounter (Signed)
Copied from CRM [id number]. Topic: Clinical - Medication Refill >> Nov 29, 2023 12:07 PM Clayton Bibles wrote: Most Recent Primary Care Visit:  Provider: Lynnea Ferrier T  Department: [facility]  Visit Type: OFFICE VISIT  Date: 09/21/2023  Medication: Tramadol  Has the patient contacted their pharmacy? Yes (Agent: If no, request that the patient contact the pharmacy for the refill. If patient does not wish to contact the pharmacy document the reason why and proceed with request.) (Agent: If yes, when and what did the pharmacy advise?)  Is this the correct pharmacy for this prescription? Yes If no, delete pharmacy and type the correct one.  This is the patient's preferred pharmacy:  [facility] [id number] [place], [place] - [address] [place] [address] Phone: [phone] Fax: [phone]   Has the prescription been filled recently? No  Is the patient out of the medication? Yes  Has the patient been seen for an appointment in the last year OR does the patient have an upcoming appointment? Yes  Can we respond through MyChart? No  Agent: Please be advised that Rx refills may take up to 3 business days. We ask that you follow-up with your pharmacy.

## 2024-01-13 ENCOUNTER — Ambulatory Visit: Payer: Medicare HMO | Admitting: *Deleted

## 2024-01-13 DIAGNOSIS — Z0000 Encounter for general adult medical examination without abnormal findings: Secondary | ICD-10-CM | POA: Diagnosis not present

## 2024-01-13 DIAGNOSIS — Z1211 Encounter for screening for malignant neoplasm of colon: Secondary | ICD-10-CM

## 2024-01-13 NOTE — Progress Notes (Signed)
Subjective:   Curtis Lucas is a 63 y.o. male who presents for Medicare Annual/Subsequent preventive examination.  Visit Complete: Virtual I connected with  Curtis Lucas on 01/13/24 by a audio enabled telemedicine application and verified that I am speaking with the correct person using two identifiers.  Patient Location: Home  Provider Location: Home Office  I discussed the limitations of evaluation and management by telemedicine. The patient expressed understanding and agreed to proceed.  Vital Signs: Because this visit was a virtual/telehealth visit, some criteria may be missing or patient reported. Any vitals not documented were not able to be obtained and vitals that have been documented are patient reported.    Unable to connect to video  Cardiac Risk Factors include: family history of premature cardiovascular disease;male gender;hypertension     Objective:    Today's Vitals   01/13/24 1437  PainSc: 8    There is no height or weight on file to calculate BMI.     01/13/2024    2:39 PM 01/05/2023   10:32 AM 09/10/2021   12:50 PM 02/24/2017    7:11 AM 02/19/2017   11:52 AM 09/14/2016    8:05 AM 05/03/2012   12:30 PM  Advanced Directives  Does Patient Have a Medical Advance Directive? No No Yes No No No Patient does not have advance directive;Patient would not like information  Does patient want to make changes to medical advance directive?   Yes (ED - Information included in AVS)      Would patient like information on creating a medical advance directive? No - Patient declined No - Patient declined  Yes (MAU/Ambulatory/Procedural Areas - Information given)     Pre-existing out of facility DNR order (yellow form or pink MOST form)       No    Current Medications (verified) Outpatient Encounter Medications as of 01/13/2024  Medication Sig   albuterol (VENTOLIN HFA) 108 (90 Base) MCG/ACT inhaler Inhale 2 puffs into the lungs every 6 (six) hours as needed for wheezing  or shortness of breath.   amLODipine (NORVASC) 10 MG tablet Take 1 tablet (10 mg total) by mouth daily.   aspirin 81 MG tablet Take 81 mg by mouth daily.   atenolol (TENORMIN) 100 MG tablet TAKE 1 TABLET BY MOUTH DAILY   escitalopram (LEXAPRO) 20 MG tablet Take 1 tablet (20 mg total) by mouth daily.   FLUZONE QUADRIVALENT 0.5 ML injection    Omega-3 Fatty Acids (FISH OIL) 1200 MG CAPS Take 1 capsule by mouth at bedtime.    pantoprazole (PROTONIX) 40 MG tablet TAKE 1 TABLET BY MOUTH DAILY   rosuvastatin (CRESTOR) 20 MG tablet TAKE 1 TABLET BY MOUTH DAILY   SPIKEVAX injection    traMADol (ULTRAM) 50 MG tablet Take 1 tablet (50 mg total) by mouth every 6 (six) hours as needed.   zolpidem (AMBIEN) 10 MG tablet TAKE 1 TABLET BY MOUTH EVERY NIGHT AT BEDTIME AS NEEDED FOR SLEEP   neomycin-polymyxin-hydrocortisone (CORTISPORIN) OTIC solution 3 drops each ear for 5 days (Patient not taking: Reported on 01/13/2024)   No facility-administered encounter medications on file as of 01/13/2024.    Allergies (verified) Lipitor [atorvastatin calcium]   History: Past Medical History:  Diagnosis Date   Allergy    Rhinitis   Anxiety    Arthritis    Biceps rupture, distal, right, initial encounter    CVA (cerebral vascular accident) (HCC)    Depression    Difficulty sleeping    Elevated lipids  GERD (gastroesophageal reflux disease)    Hyperglycemia    Hyperlipidemia    Hypertension    Insomnia    Past Surgical History:  Procedure Laterality Date   COLONOSCOPY  09/24/2003   patterson--normal per pt.   DISTAL BICEPS TENDON REPAIR Right 02/24/2017   Procedure: RIGHT DISTAL BICEPS TENDON REPAIR;  Surgeon: Tarry Kos, MD;  [facility]:  [facility];  Service: Orthopedics;  Laterality: Right;   HEAD INJURY   1987   JOINT REPLACEMENT     LUMBAR EPIDURAL INJECTION     RECTAL SURGERY  2006   TOTAL HIP ARTHROPLASTY     TOTAL HIP ARTHROPLASTY  05/03/2012   Procedure: TOTAL HIP ARTHROPLASTY  ANTERIOR APPROACH;  Surgeon: Shelda Pal, MD;  Location: [facility];  Service: Orthopedics;  Laterality: Right;   Family History  Problem Relation Age of Onset   Stroke Mother    Cancer Mother        colon   Dementia Mother    Colon cancer Mother    Parkinson's disease Father    Heart attack Paternal Grandfather    Dementia Paternal Grandmother    Colon polyps Neg Hx    Esophageal cancer Neg Hx    Rectal cancer Neg Hx    Stomach cancer Neg Hx    Social History   Socioeconomic History   Marital status: Married    Spouse name: Debbie   Number of children: 0   Years of education: HS   Highest education level: Not on file  Occupational History    Employer: [employer]    Comment: Stage manager  Tobacco Use   Smoking status: Never   Smokeless tobacco: Never  Substance and Sexual Activity   Alcohol use: Yes    Comment: 4-5 BEERS PER WK   Drug use: Yes    Types: Marijuana   Sexual activity: Yes  Other Topics Concern   Not on file  Social History Narrative   Patient lives at home with spouse.   Caffeine Use:4 16oz Mt. Dew sodas daily   Social Drivers of Health   Financial Resource Strain: Low Risk  (01/13/2024)   Overall Financial Resource Strain (CARDIA)    Difficulty of Paying Living Expenses: Not hard at all  Food Insecurity: No Food Insecurity (01/13/2024)   Hunger Vital Sign    Worried About Running Out of Food in the Last Year: Never true    Ran Out of Food in the Last Year: Never true  Transportation Needs: No Transportation Needs (01/13/2024)   PRAPARE - Administrator, Civil Service (Medical): No    Lack of Transportation (Non-Medical): No  Physical Activity: Inactive (01/13/2024)   Exercise Vital Sign    Days of Exercise per Week: 0 days    Minutes of Exercise per Session: 0 min  Stress: No Stress Concern Present (01/13/2024)   [employer] of Occupational Health - Occupational Stress Questionnaire    Feeling of Stress : Not at  all  Social Connections: Unknown (01/13/2024)   Social Connection and Isolation Panel [NHANES]    Frequency of Communication with Friends and Family: Three times a week    Frequency of Social Gatherings with Friends and Family: Three times a week    Attends Religious Services: Not on file    Active Member of Clubs or Organizations: No    Attends Banker Meetings: Never    Marital Status: Married    Tobacco Counseling Counseling given: Not  Answered   Clinical Intake:  Pre-visit preparation completed: Yes  Pain : 0-10 Pain Score: 8  Pain Type: Chronic pain Pain Location: Arm Pain Orientation: Right Pain Descriptors / Indicators: Burning, Aching, Dull Pain Onset: More than a month ago Pain Frequency: Constant Pain Relieving Factors: tramadol  Pain Relieving Factors: tramadol  Diabetes: No  How often do you need to have someone help you when you read instructions, pamphlets, or other written materials from your doctor or pharmacy?: 1 - Never  Interpreter Needed?: No  Information entered by :: Remi Haggard LPN   Activities of Daily Living    01/13/2024    2:42 PM  In your present state of health, do you have any difficulty performing the following activities:  Hearing? 0  Vision? 0  Difficulty concentrating or making decisions? 0  Walking or climbing stairs? 0  Dressing or bathing? 0  Preparing Food and eating ? N  Using the Toilet? N  In the past six months, have you accidently leaked urine? N  Do you have problems with loss of bowel control? N  Managing your Medications? N  Managing your Finances? N    Patient Care Team: Donita Brooks, MD as PCP - General (Family Medicine) Erroll Luna, [facility] (Inactive) as Pharmacist (Pharmacist)  Indicate any recent Medical Services you may have received from other than [facility] providers in the past year (date may be approximate).     Assessment:   This is a routine wellness examination for  Durelle.  Hearing/Vision screen Hearing Screening - Comments:: No trouble hearing Vision Screening - Comments:: Not up to date    Goals Addressed             This Visit's Progress    Patient Stated       Pay off some debt       Depression Screen    01/13/2024    2:44 PM 01/05/2023   10:31 AM 10/01/2022    2:10 PM 10/01/2022    1:58 PM 06/04/2022   12:12 PM 09/10/2021   12:42 PM 08/06/2020    9:20 AM  PHQ 2/9 Scores  PHQ - 2 Score 0 0 2 0 0 4 0  PHQ- 9 Score 3  12 0 1 14     Fall Risk    01/05/2023   10:30 AM 10/01/2022    1:58 PM 06/04/2022   12:07 PM 09/10/2021   12:42 PM 08/06/2020    9:18 AM  Fall Risk   Falls in the past year? 0 0  0 0  Number falls in past yr: 0 0 0 0 0  Injury with Fall? 0 0 0 0 0  Risk for fall due to : No Fall Risks      Follow up Falls evaluation completed;Education provided;Falls prevention discussed   Falls evaluation completed Falls evaluation completed    MEDICARE RISK AT HOME: Medicare Risk at Home Any stairs in or around the home?: Yes If so, are there any without handrails?: No Home free of loose throw rugs in walkways, pet beds, electrical cords, etc?: Yes Adequate lighting in your home to reduce risk of falls?: Yes Life alert?: No Use of a cane, walker or w/c?: No Grab bars in the bathroom?: Yes Shower chair or bench in shower?: Yes Elevated toilet seat or a handicapped toilet?: No  TIMED UP AND GO:  Was the test performed?  No    Cognitive Function:    09/10/2021   12:52 PM  MMSE - Mini Mental State Exam  Not completed: Unable to complete        01/13/2024    2:41 PM 01/05/2023   10:32 AM 09/10/2021   12:47 PM  6CIT Screen  What Year? 0 points 0 points 0 points  What month? 0 points 0 points 0 points  What time? 0 points 0 points 0 points  Count back from 20 0 points 0 points 4 points  Months in reverse 4 points 0 points 4 points  Repeat phrase 4 points 0 points 4 points  Total Score 8 points 0 points 12 points     Immunizations Immunization History  Administered Date(s) Administered   PFIZER(Purple Top)SARS-COV-2 Vaccination 03/18/2020, 04/09/2020   Td 07/22/2003   Tdap 04/20/2014    TDAP status: Up to date  Flu Vaccine status: Due, Education has been provided regarding the importance of this vaccine. Advised may receive this vaccine at local pharmacy or Health Dept. Aware to provide a copy of the vaccination record if obtained from local pharmacy or Health Dept. Verbalized acceptance and understanding.  Pneumococcal vaccine status: Up to date  Covid-19 vaccine status: Information provided on how to obtain vaccines.   Qualifies for Shingles Vaccine? Yes   Zostavax completed Yes   Shingrix Completed?: No.    Education has been provided regarding the importance of this vaccine. Patient has been advised to call insurance company to determine out of pocket expense if they have not yet received this vaccine. Advised may also receive vaccine at local pharmacy or Health Dept. Verbalized acceptance and understanding.  Screening Tests Health Maintenance  Topic Date Due   Zoster Vaccines- Shingrix (1 of 2) Never done   Colonoscopy  06/28/2022   INFLUENZA VACCINE  03/20/2024 (Originally 07/22/2023)   DTaP/Tdap/Td (3 - Td or Tdap) 04/20/2024   Medicare Annual Wellness (AWV)  01/12/2025   Hepatitis C Screening  Completed   HIV Screening  Completed   HPV VACCINES  Aged Out   COVID-19 Vaccine  Discontinued    Health Maintenance  Health Maintenance Due  Topic Date Due   Zoster Vaccines- Shingrix (1 of 2) Never done   Colonoscopy  06/28/2022    Colorectal cancer screening: Referral to GI placed  . Pt aware the office will call re: appt.  Lung Cancer Screening: (Low Dose CT Chest recommended if Age 84-80 years, 20 pack-year currently smoking OR have quit w/in 15years.) does not qualify.   Lung Cancer Screening Referral:   Additional Screening:  Hepatitis C Screening: does not qualify;  Completed 2018  Vision Screening: Recommended annual ophthalmology exams for early detection of glaucoma and other disorders of the eye. Is the patient up to date with their annual eye exam?  No  Who is the provider or what is the name of the office in which the patient attends annual eye exams?  Education provided If pt is not established with a provider, would they like to be referred to a provider to establish care? No .   Dental Screening: Recommended annual dental exams for proper oral hygiene    Community Resource Referral / Chronic Care Management: CRR required this visit?  No   CCM required this visit?  No     Plan:     I have personally reviewed and noted the following in the patient's chart:   Medical and social history Use of alcohol, tobacco or illicit drugs  Current medications and supplements including opioid prescriptions. Patient is currently taking opioid prescriptions. Information  provided to patient regarding non-opioid alternatives. Patient advised to discuss non-opioid treatment plan with their provider. Functional ability and status Nutritional status Physical activity Advanced directives List of other physicians Hospitalizations, surgeries, and ER visits in previous 12 months Vitals Screenings to include cognitive, depression, and falls Referrals and appointments  In addition, I have reviewed and discussed with patient certain preventive protocols, quality metrics, and best practice recommendations. A written personalized care plan for preventive services as well as general preventive health recommendations were provided to patient.     Remi Haggard, LPN   03/29/8118   After Visit Summary: (MyChart) Due to this being a telephonic visit, the after visit summary with patients personalized plan was offered to patient via MyChart   Nurse Notes:

## 2024-01-13 NOTE — Patient Instructions (Signed)
Curtis Lucas , Thank you for taking time to come for your Medicare Wellness Visit. I appreciate your ongoing commitment to your health goals. Please review the following plan we discussed and let me know if I can assist you in the future.   Screening recommendations/referrals: Colonoscopy: Education provided Recommended yearly ophthalmology/optometry visit for glaucoma screening and checkup Recommended yearly dental visit for hygiene and checkup  Vaccinations: Influenza vaccine: Education provided Pneumococcal vaccine: up to date Tdap vaccine: up to date Shingles vaccine: education provided     Preventive Care 65 Years and Older, Male Preventive care refers to lifestyle choices and visits with your health care provider that can promote health and wellness. What does preventive care include? A yearly physical exam. This is also called an annual well check. Dental exams once or twice a year. Routine eye exams. Ask your health care provider how often you should have your eyes checked. Personal lifestyle choices, including: Daily care of your teeth and gums. Regular physical activity. Eating a healthy diet. Avoiding tobacco and drug use. Limiting alcohol use. Practicing safe sex. Taking low doses of aspirin every day. Taking vitamin and mineral supplements as recommended by your health care provider. What happens during an annual well check? The services and screenings done by your health care provider during your annual well check will depend on your age, overall health, lifestyle risk factors, and family history of disease. Counseling  Your health care provider may ask you questions about your: Alcohol use. Tobacco use. Drug use. Emotional well-being. Home and relationship well-being. Sexual activity. Eating habits. History of falls. Memory and ability to understand (cognition). Work and work Astronomer. Screening  You may have the following tests or measurements: Height,  weight, and BMI. Blood pressure. Lipid and cholesterol levels. These may be checked every 5 years, or more frequently if you are over 33 years old. Skin check. Lung cancer screening. You may have this screening every year starting at age 16 if you have a 30-pack-year history of smoking and currently smoke or have quit within the past 15 years. Fecal occult blood test (FOBT) of the stool. You may have this test every year starting at age 70. Flexible sigmoidoscopy or colonoscopy. You may have a sigmoidoscopy every 5 years or a colonoscopy every 10 years starting at age 78. Prostate cancer screening. Recommendations will vary depending on your family history and other risks. Hepatitis C blood test. Hepatitis B blood test. Sexually transmitted disease (STD) testing. Diabetes screening. This is done by checking your blood sugar (glucose) after you have not eaten for a while (fasting). You may have this done every 1-3 years. Abdominal aortic aneurysm (AAA) screening. You may need this if you are a current or former smoker. Osteoporosis. You may be screened starting at age 20 if you are at high risk. Talk with your health care provider about your test results, treatment options, and if necessary, the need for more tests. Vaccines  Your health care provider may recommend certain vaccines, such as: Influenza vaccine. This is recommended every year. Tetanus, diphtheria, and acellular pertussis (Tdap, Td) vaccine. You may need a Td booster every 10 years. Zoster vaccine. You may need this after age 36. Pneumococcal 13-valent conjugate (PCV13) vaccine. One dose is recommended after age 49. Pneumococcal polysaccharide (PPSV23) vaccine. One dose is recommended after age 44. Talk to your health care provider about which screenings and vaccines you need and how often you need them. This information is not intended to replace advice given  to you by your health care provider. Make sure you discuss any  questions you have with your health care provider. Document Released: 01/03/2016 Document Revised: 08/26/2016 Document Reviewed: 10/08/2015 Elsevier Interactive Patient Education  2017 [employer].  Fall Prevention in the Home Falls can cause injuries. They can happen to people of all ages. There are many things you can do to make your home safe and to help prevent falls. What can I do on the outside of my home? Regularly fix the edges of walkways and driveways and fix any cracks. Remove anything that might make you trip as you walk through a door, such as a raised step or threshold. Trim any bushes or trees on the path to your home. Use bright outdoor lighting. Clear any walking paths of anything that might make someone trip, such as rocks or tools. Regularly check to see if handrails are loose or broken. Make sure that both sides of any steps have handrails. Any raised decks and porches should have guardrails on the edges. Have any leaves, snow, or ice cleared regularly. Use sand or salt on walking paths during winter. Clean up any spills in your garage right away. This includes oil or grease spills. What can I do in the bathroom? Use night lights. Install grab bars by the toilet and in the tub and shower. Do not use towel bars as grab bars. Use non-skid mats or decals in the tub or shower. If you need to sit down in the shower, use a plastic, non-slip stool. Keep the floor dry. Clean up any water that spills on the floor as soon as it happens. Remove soap buildup in the tub or shower regularly. Attach bath mats securely with double-sided non-slip rug tape. Do not have throw rugs and other things on the floor that can make you trip. What can I do in the bedroom? Use night lights. Make sure that you have a light by your bed that is easy to reach. Do not use any sheets or blankets that are too big for your bed. They should not hang down onto the floor. Have a firm chair that has side  arms. You can use this for support while you get dressed. Do not have throw rugs and other things on the floor that can make you trip. What can I do in the kitchen? Clean up any spills right away. Avoid walking on wet floors. Keep items that you use a lot in easy-to-reach places. If you need to reach something above you, use a strong step stool that has a grab bar. Keep electrical cords out of the way. Do not use floor polish or wax that makes floors slippery. If you must use wax, use non-skid floor wax. Do not have throw rugs and other things on the floor that can make you trip. What can I do with my stairs? Do not leave any items on the stairs. Make sure that there are handrails on both sides of the stairs and use them. Fix handrails that are broken or loose. Make sure that handrails are as long as the stairways. Check any carpeting to make sure that it is firmly attached to the stairs. Fix any carpet that is loose or worn. Avoid having throw rugs at the top or bottom of the stairs. If you do have throw rugs, attach them to the floor with carpet tape. Make sure that you have a light switch at the top of the stairs and the bottom of the  stairs. If you do not have them, ask someone to add them for you. What else can I do to help prevent falls? Wear shoes that: Do not have high heels. Have rubber bottoms. Are comfortable and fit you well. Are closed at the toe. Do not wear sandals. If you use a stepladder: Make sure that it is fully opened. Do not climb a closed stepladder. Make sure that both sides of the stepladder are locked into place. Ask someone to hold it for you, if possible. Clearly mark and make sure that you can see: Any grab bars or handrails. First and last steps. Where the edge of each step is. Use tools that help you move around (mobility aids) if they are needed. These include: Canes. Walkers. Scooters. Crutches. Turn on the lights when you go into a dark area.  Replace any light bulbs as soon as they burn out. Set up your furniture so you have a clear path. Avoid moving your furniture around. If any of your floors are uneven, fix them. If there are any pets around you, be aware of where they are. Review your medicines with your doctor. Some medicines can make you feel dizzy. This can increase your chance of falling. Ask your doctor what other things that you can do to help prevent falls. This information is not intended to replace advice given to you by your health care provider. Make sure you discuss any questions you have with your health care provider. Document Released: 10/03/2009 Document Revised: 05/14/2016 Document Reviewed: 01/11/2015 Elsevier Interactive Patient Education  2017 [employer].

## 2024-01-16 ENCOUNTER — Other Ambulatory Visit: Payer: Self-pay | Admitting: Family Medicine

## 2024-02-17 ENCOUNTER — Other Ambulatory Visit: Payer: Self-pay | Admitting: Family Medicine

## 2024-04-19 ENCOUNTER — Other Ambulatory Visit: Payer: Self-pay | Admitting: Family Medicine

## 2024-04-20 NOTE — Telephone Encounter (Signed)
 Requesting refill:  Last refill 11/29/23 Please approve if appropriate. Thank you!

## 2024-04-20 NOTE — Telephone Encounter (Signed)
 Caller checking on the status of patient zolpidem  (AMBIEN ) 10 MG tablet. Caller would like a follow up call If there's any issues refilling medication     [name] [id number] [place], [place] - [address] Phone: [phone]  Fax: [phone]

## 2024-04-28 ENCOUNTER — Ambulatory Visit
Admission: EM | Admit: 2024-04-28 | Discharge: 2024-04-28 | Disposition: A | Discharge status: Home / Self Care | Attending: Nurse Practitioner | Admitting: Nurse Practitioner

## 2024-04-28 DIAGNOSIS — Z8739 Personal history of other diseases of the musculoskeletal system and connective tissue: Secondary | ICD-10-CM | POA: Diagnosis not present

## 2024-04-28 DIAGNOSIS — M545 Low back pain, unspecified: Secondary | ICD-10-CM | POA: Diagnosis not present

## 2024-04-28 MED ORDER — TIZANIDINE HCL 4 MG PO TABS: 4.0000 mg | ORAL_TABLET | Freq: Four times a day (QID) | ORAL | 0 refills | Status: AC | PRN

## 2024-04-28 MED ORDER — KETOROLAC TROMETHAMINE 30 MG/ML IJ SOLN
30.0000 mg | Freq: Once | INTRAMUSCULAR | Status: AC
  Administered 2024-04-28: 30 mg via INTRAMUSCULAR

## 2024-04-28 MED ORDER — DEXAMETHASONE SODIUM PHOSPHATE 10 MG/ML IJ SOLN
10.0000 mg | INTRAMUSCULAR | Status: AC
  Administered 2024-04-28: 10 mg via INTRAMUSCULAR

## 2024-04-28 NOTE — ED Triage Notes (Signed)
 Pt reports he has lower back pain x 2 days

## 2024-04-28 NOTE — ED Provider Notes (Signed)
 [facility]    CSN: [id number] Arrival date & time: 04/28/24  1208      History   Chief Complaint No chief complaint on file.   HPI Curtis Lucas is a 63 y.o. male.   The history is provided by the patient.   Patient presents with a 2-day history of right-sided low back pain.  Patient denies injury, trauma, lower extremity numbness, tingling, weakness, or radiation of pain.  Patient states that he thinks this is his "kidneys."  Reports that he has had a history of kidney stones, but is been quite sometime.  Patient denies symptoms of dysuria, urinary frequency, urgency, hematuria, or decreased urine stream.  Patient denies previous history of back pain.  Per review of the chart, patient with CT of his lumbar spine in 2012 which showed multilevel lumbar disc bulges, multilevel lumbar facet arthropathy, and multilevel right-sided facet vacuum phenomena.  Past Medical History:  Diagnosis Date   Allergy    Rhinitis   Anxiety    Arthritis    Biceps rupture, distal, right, initial encounter    CVA (cerebral vascular accident) (HCC)    Depression    Difficulty sleeping    Elevated lipids    GERD (gastroesophageal reflux disease)    Hyperglycemia    Hyperlipidemia    Hypertension    Insomnia     Patient Active Problem List   Diagnosis Date Noted   Steatosis of liver 06/06/2020   Renal calculus, right 06/06/2020   Mixed hyperlipidemia 08/31/2019   Olecranon bursitis of left elbow 07/22/2017   Impingement syndrome of right shoulder 07/08/2017   Rupture of right distal biceps tendon 02/11/2017   Generalized anxiety disorder 01/20/2017   Prostate cancer screening 01/20/2017   Vitamin D  deficiency 05/06/2015   Visual disturbance 05/02/2015   Insomnia    S/P right THA, AA 05/03/2012   Hypertension    GERD (gastroesophageal reflux disease)    CVA (cerebral vascular accident) (HCC)    Allergy    Elevated lipids    Hyperglycemia     Past Surgical History:   Procedure Laterality Date   COLONOSCOPY  09/24/2003   patterson--normal per pt.   DISTAL BICEPS TENDON REPAIR Right 02/24/2017   Procedure: RIGHT DISTAL BICEPS TENDON REPAIR;  Surgeon: Wes Hamman, MD;  Location: [facility];  Service: Orthopedics;  Laterality: Right;   HEAD INJURY   1987   JOINT REPLACEMENT     LUMBAR EPIDURAL INJECTION     RECTAL SURGERY  2006   TOTAL HIP ARTHROPLASTY     TOTAL HIP ARTHROPLASTY  05/03/2012   Procedure: TOTAL HIP ARTHROPLASTY ANTERIOR APPROACH;  Surgeon: Bevin Bucks, MD;  Location: [facility];  Service: Orthopedics;  Laterality: Right;       Home Medications    Prior to Admission medications   Medication Sig Start Date End Date Taking? Authorizing Provider  albuterol  (VENTOLIN  HFA) 108 (90 Base) MCG/ACT inhaler Inhale 2 puffs into the lungs every 6 (six) hours as needed for wheezing or shortness of breath. 10/28/21   Austine Lefort, MD  amLODipine  (NORVASC ) 10 MG tablet TAKE 1 TABLET BY MOUTH DAILY 02/17/24   Austine Lefort, MD  aspirin  81 MG tablet Take 81 mg by mouth daily.    [provider]  atenolol  (TENORMIN ) 100 MG tablet TAKE 1 TABLET BY MOUTH DAILY 10/19/23   Austine Lefort, MD  escitalopram  (LEXAPRO ) 20 MG tablet Take 1 tablet (20 mg total) by mouth  daily. 11/09/23   Austine Lefort, MD  FLUZONE QUADRIVALENT 0.5 ML injection  10/16/22   [provider]  neomycin -polymyxin-hydrocortisone (CORTISPORIN) OTIC solution 3 drops each ear for 5 days Patient not taking: Reported on 01/13/2024 05/12/23   Sandi Crosby, PA-C  Omega-3 Fatty Acids (FISH OIL) 1200 MG CAPS Take 1 capsule by mouth at bedtime.     [provider]  pantoprazole  (PROTONIX ) 40 MG tablet TAKE 1 TABLET BY MOUTH DAILY 11/25/23   Austine Lefort, MD  rosuvastatin  (CRESTOR ) 20 MG tablet TAKE 1 TABLET BY MOUTH DAILY 09/29/23   Austine Lefort, MD  [facility] injection  10/16/22   [provider]  traMADol  (ULTRAM ) 50 MG  tablet TAKE 1 TABLET BY MOUTH EVERY 6 HOURS AS NEEDED 02/17/24   Austine Lefort, MD  zolpidem  (AMBIEN ) 10 MG tablet TAKE 1 TABLET BY MOUTH EVERY NIGHT AT BEDTIME AS NEEDED FOR SLEEP 04/20/24   Austine Lefort, MD    Family History Family History  Problem Relation Age of Onset   Stroke Mother    Cancer Mother        colon   Dementia Mother    Colon cancer Mother    Parkinson's disease Father    Heart attack Paternal Grandfather    Dementia Paternal Grandmother    Colon polyps Neg Hx    Esophageal cancer Neg Hx    Rectal cancer Neg Hx    Stomach cancer Neg Hx     Social History Social History   Tobacco Use   Smoking status: Never   Smokeless tobacco: Never  Substance Use Topics   Alcohol use: Yes    Comment: 4-5 BEERS PER WK   Drug use: Yes    Types: Marijuana     Allergies   Lipitor [atorvastatin calcium ]   Review of Systems Review of Systems Per HPI  Physical Exam Triage Vital Signs ED Triage Vitals  Encounter Vitals Group     BP 04/28/24 1215 134/84     Systolic BP Percentile --      Diastolic BP Percentile --      Pulse Rate 04/28/24 1215 68     Resp 04/28/24 1215 18     Temp 04/28/24 1215 97.9 F (36.6 C)     Temp Source 04/28/24 1215 Oral     SpO2 04/28/24 1215 92 %     Weight --      Height --      Head Circumference --      Peak Flow --      Pain Score 04/28/24 1214 9     Pain Loc --      Pain Education --      Exclude from Growth Chart --    No data found.  Updated Vital Signs BP 134/84 (BP Location: Right Arm)   Pulse 68   Temp 97.9 F (36.6 C) (Oral)   Resp 18   SpO2 92%   Visual Acuity Right Eye Distance:   Left Eye Distance:   Bilateral Distance:    Right Eye Near:   Left Eye Near:    Bilateral Near:     Physical Exam Vitals and nursing note reviewed.  Constitutional:      General: He is not in acute distress.    Appearance: Normal appearance.  HENT:     Head: Normocephalic.  Eyes:     Extraocular Movements:  Extraocular movements intact.     Pupils: Pupils are equal, round,  and reactive to light.  Cardiovascular:     Rate and Rhythm: Normal rate and regular rhythm.     Pulses: Normal pulses.     Heart sounds: Normal heart sounds.  Pulmonary:     Effort: Pulmonary effort is normal.     Breath sounds: Normal breath sounds.  Abdominal:     General: Bowel sounds are normal.     Palpations: Abdomen is soft.     Tenderness: There is no abdominal tenderness. There is no right CVA tenderness or left CVA tenderness.  Musculoskeletal:     Cervical back: Normal range of motion.  Skin:    General: Skin is warm and dry.  Neurological:     General: No focal deficit present.     Mental Status: He is alert and oriented to person, place, and time.  Psychiatric:        Mood and Affect: Mood normal.        Behavior: Behavior normal.     UC Treatments / Results  Labs (all labs ordered are listed, but only abnormal results are displayed) Labs Reviewed - No data to display  EKG   Radiology No results found.  Procedures Procedures (including critical care time)  Medications Ordered in UC Medications - No data to display  Initial Impression / Assessment and Plan / UC Course  I have reviewed the triage vital signs and the nursing notes.  Pertinent labs & imaging results that were available during my care of the patient were reviewed by me and considered in my medical decision making (see chart for details).  Requested urine sample from patient; however, patient was unable to provide a urine sample during this appointment.  Per review of the chart, patient with underlying back pain.  He also has underlying history of kidney stones.  On exam, he was well-appearing, and is in no acute distress.  He has no CVA tenderness noted or urinary complaints.  Decadron  10 mg IM and Toradol 30 mg IM administered for back pain.  Will start tizanidine 4 mg.  Patient was advised to follow-up immediately for  worsening pain or new onset urinary symptoms.  Supportive care recommendations were provided and discussed with the patient to include over-the-counter analgesics, the use of ice or heat, and stretching.  Patient was in agreement with this plan of care and verbalizes understanding.  All questions were answered.  Patient stable for discharge. Final Clinical Impressions(s) / UC Diagnoses   Final diagnoses:  None   Discharge Instructions   None    ED Prescriptions   None    PDMP not reviewed this encounter.   Hardy Lia, NP 04/28/24 1357

## 2024-04-28 NOTE — Discharge Instructions (Addendum)
 You were given injections of Toradol 30 mg and Decadron  10 mg.  Do not take any additional NSAIDs today such as Advil, ibuprofen, Aleve, Motrin, or naproxen.  She can take over-the-counter Tylenol  for breakthrough pain. Take medication as prescribed. Try to remain as active as possible. Gentle range of motion and stretching exercises to help with back spasm and pain. May apply ice or heat as needed.  Ice is recommended for pain or swelling, heat for spasm or stiffness.  Apply for 20 minutes, remove for 1 hour, then repeat. May take over-the-counter Tylenol  extra strength 500 mg tablet approximately 1 -2 hours after taking ibuprofen for breakthrough pain. Go to the emergency department immediately if you develop weakness in your legs or feet, inability to walk, loss of bowel or bladder function, difficulty urinating or passing a bowel movement, or other concerns. Follow-up with your primary care physician if your symptoms do not improve.

## 2024-05-05 ENCOUNTER — Other Ambulatory Visit: Payer: Self-pay | Admitting: Family Medicine

## 2024-05-05 DIAGNOSIS — F411 Generalized anxiety disorder: Secondary | ICD-10-CM

## 2024-05-08 NOTE — Telephone Encounter (Signed)
 Requested Prescriptions  Pending Prescriptions Disp Refills   escitalopram  (LEXAPRO ) 20 MG tablet [Pharmacy Med Name: ESCITALOPRAM  20 MG TABLET] 90 tablet 0    Sig: TAKE 1 TABLET BY MOUTH DAILY     Psychiatry:  Antidepressants - SSRI Passed - 05/08/2024  2:22 PM      Passed - Valid encounter within last 6 months    Recent Outpatient Visits           7 months ago Primary hypertension   [place] [facility] Austine Lefort, MD   11 months ago Primary hypertension   [place] [facility] Jenelle Mis, FNP   1 year ago Colon cancer screening   [place] [facility] Jenelle Mis, FNP   1 year ago Prostate cancer screening   [place] [facility] Pickard, Cisco Crest, MD

## 2024-05-17 ENCOUNTER — Ambulatory Visit (INDEPENDENT_AMBULATORY_CARE_PROVIDER_SITE_OTHER): Admitting: Family Medicine

## 2024-05-17 VITALS — BP 138/70 | HR 73 | Temp 98.6°F | Resp 12 | Ht 70.0 in | Wt 201.2 lb

## 2024-05-17 DIAGNOSIS — R229 Localized swelling, mass and lump, unspecified: Secondary | ICD-10-CM | POA: Insufficient documentation

## 2024-05-17 NOTE — Progress Notes (Signed)
 Subjective:  HPI: Curtis Lucas is a 63 y.o. male presenting on 05/17/2024 for Acute Home Visit (Painful red lump on rt side. )   HPI Patient is in today for a lump on his right axilla for 1 month. It started about the size of a pimple and has gotten larger. It is not itching but is painful. No redness, warmth, or drainage. He denies fever, chills, body aches. Has tried nothing.   Review of Systems  All other systems reviewed and are negative.   Relevant past medical history reviewed and updated as indicated.   Past Medical History:  Diagnosis Date   Allergy    Rhinitis   Anxiety    Arthritis    Biceps rupture, distal, right, initial encounter    CVA (cerebral vascular accident) (HCC)    Depression    Difficulty sleeping    Elevated lipids    GERD (gastroesophageal reflux disease)    Hyperglycemia    Hyperlipidemia    Hypertension    Insomnia      Past Surgical History:  Procedure Laterality Date   COLONOSCOPY  09/24/2003   patterson--normal per pt.   DISTAL BICEPS TENDON REPAIR Right 02/24/2017   Procedure: RIGHT DISTAL BICEPS TENDON REPAIR;  Surgeon: Wes Hamman, MD;  Location: [facility];  Service: Orthopedics;  Laterality: Right;   HEAD INJURY   1987   JOINT REPLACEMENT     LUMBAR EPIDURAL INJECTION     RECTAL SURGERY  2006   TOTAL HIP ARTHROPLASTY     TOTAL HIP ARTHROPLASTY  05/03/2012   Procedure: TOTAL HIP ARTHROPLASTY ANTERIOR APPROACH;  Surgeon: Bevin Bucks, MD;  Location[facility];  Service: Orthopedics;  Laterality: Right;    Allergies and medications reviewed and updated.   Current Outpatient Medications:    albuterol  (VENTOLIN  HFA) 108 (90 Base) MCG/ACT inhaler, Inhale 2 puffs into the lungs every 6 (six) hours as needed for wheezing or shortness of breath., Disp: 8 g, Rfl: 0   amLODipine  (NORVASC ) 10 MG tablet, TAKE 1 TABLET BY MOUTH DAILY, Disp: 90 tablet, Rfl: 2   aspirin  81 MG tablet, Take 81 mg by mouth daily., Disp: , Rfl:     atenolol  (TENORMIN ) 100 MG tablet, TAKE 1 TABLET BY MOUTH DAILY, Disp: 90 tablet, Rfl: 3   escitalopram  (LEXAPRO ) 20 MG tablet, TAKE 1 TABLET BY MOUTH DAILY, Disp: 90 tablet, Rfl: 0   FLUZONE QUADRIVALENT 0.5 ML injection, , Disp: , Rfl:    neomycin -polymyxin-hydrocortisone (CORTISPORIN) OTIC solution, 3 drops each ear for 5 days (Patient not taking: Reported on 01/13/2024), Disp: 10 mL, Rfl: 0   Omega-3 Fatty Acids (FISH OIL) 1200 MG CAPS, Take 1 capsule by mouth at bedtime. , Disp: , Rfl:    pantoprazole  (PROTONIX ) 40 MG tablet, TAKE 1 TABLET BY MOUTH DAILY, Disp: 90 tablet, Rfl: 3   rosuvastatin  (CRESTOR ) 20 MG tablet, TAKE 1 TABLET BY MOUTH DAILY, Disp: 90 tablet, Rfl: 3   SPIKEVAX injection, , Disp: , Rfl:    tiZANidine  (ZANAFLEX ) 4 MG tablet, Take 1 tablet (4 mg total) by mouth every 6 (six) hours as needed for muscle spasms., Disp: 20 tablet, Rfl: 0   traMADol  (ULTRAM ) 50 MG tablet, TAKE 1 TABLET BY MOUTH EVERY 6 HOURS AS NEEDED, Disp: 90 tablet, Rfl: 2   zolpidem  (AMBIEN ) 10 MG tablet, TAKE 1 TABLET BY MOUTH EVERY NIGHT AT BEDTIME AS NEEDED FOR SLEEP, Disp: 30 tablet, Rfl: 3  Allergies  Allergen Reactions  Lipitor [Atorvastatin Calcium ]     Severe muscle spasms    Objective:   BP 138/70 (BP Location: Left Arm, Patient Position: Sitting, Cuff Size: Normal)   Pulse 73   Temp 98.6 F (37 C)   Resp 12   Ht 5\' 10"  (1.778 m)   Wt 201 lb 3.2 oz (91.3 kg)   SpO2 95%   BMI 28.87 kg/m      05/17/2024    2:05 PM 04/28/2024   12:15 PM 09/21/2023   11:20 AM  Vitals with BMI  Height 5\' 10"   5\' 10"   Weight 201 lbs 3 oz  198 lbs 10 oz  BMI 28.87  28.5  Systolic 138 134 119  Diastolic 70 84 72  Pulse 73 68 63     Physical Exam Vitals and nursing note reviewed.  Constitutional:      Appearance: Normal appearance. He is normal weight.  HENT:     Head: Normocephalic and atraumatic.  Cardiovascular:     Rate and Rhythm: Normal rate and regular rhythm.     Pulses: Normal pulses.      Heart sounds: Normal heart sounds.  Pulmonary:     Effort: Pulmonary effort is normal.     Breath sounds: Normal breath sounds.  Skin:    General: Skin is warm and dry.     Capillary Refill: Capillary refill takes less than 2 seconds.          Comments: 0.5 cm non-fluctuant, freely movable mass to right axillary  Neurological:     General: No focal deficit present.     Mental Status: He is alert and oriented to person, place, and time. Mental status is at baseline.  Psychiatric:        Mood and Affect: Mood normal.        Behavior: Behavior normal.        Thought Content: Thought content normal.        Judgment: Judgment normal.     Assessment & Plan:  Skin nodule Assessment & Plan: Freely movable nodule 0.5cm to right axillary area without central punctate that has been present for approx 1 month. Suspect epidermal cyst vs lipoma. No redness, warmth, or drainage. No indication for I&D and Mr Gremillion declines incision removal. Continue to monitor and return to office if symptoms persist or worsen. May apply heat.      Follow up plan: Return if symptoms worsen or fail to improve.  Jenelle Mis, FNP

## 2024-05-17 NOTE — Assessment & Plan Note (Signed)
 Freely movable nodule 0.5cm to right axillary area without central punctate that has been present for approx 1 month. Suspect epidermal cyst vs lipoma. No redness, warmth, or drainage. No indication for I&D and Curtis Lucas declines incision removal. Continue to monitor and return to office if symptoms persist or worsen. May apply heat.

## 2024-06-09 ENCOUNTER — Other Ambulatory Visit: Payer: Self-pay | Admitting: Family Medicine

## 2024-07-10 ENCOUNTER — Other Ambulatory Visit: Payer: Self-pay | Admitting: Family Medicine

## 2024-07-12 NOTE — Telephone Encounter (Signed)
 Requested medication (s) are due for refill today: yes  Requested medication (s) are on the active medication list: yes  Last refill:  06/09/24  Future visit scheduled: yes  Notes to clinic:  Unable to refill per protocol, cannot delegate.      Requested Prescriptions  Pending Prescriptions Disp Refills   traMADol  (ULTRAM ) 50 MG tablet [Pharmacy Med Name: TRAMADOL  HCL 50MG  TABLET] 90 tablet     Sig: TAKE 1 TABLET BY MOUTH EVERY 6 HOURS AS NEEDED     Not Delegated - Analgesics:  Opioid Agonists Failed - 07/12/2024  2:26 PM      Failed - This refill cannot be delegated      Failed - Urine Drug Screen completed in last 360 days      Passed - Valid encounter within last 3 months    Recent Outpatient Visits           1 month ago Skin nodule   [place] [facility] Kayla Jeoffrey RAMAN, FNP   9 months ago Primary hypertension   [place] [facility] Duanne, Butler DASEN, MD   1 year ago Primary hypertension   [place] [facility] Kayla Jeoffrey RAMAN, FNP   1 year ago Colon cancer screening   [place] [facility] Kayla Jeoffrey RAMAN, FNP   2 years ago Prostate cancer screening   [place] [facility] Pickard, Butler DASEN, MD

## 2024-07-12 NOTE — Telephone Encounter (Signed)
 Pt's wife called to report that the patient is completely out of his current supply and has been for several days.

## 2024-07-12 NOTE — Telephone Encounter (Signed)
 Requested medication (s) are due for refill today: yes  Requested medication (s) are on the active medication list: yes  Last refill:  06/09/24  Future visit scheduled: no  Notes to clinic:  Unable to refill per protocol, cannot delegate.      Requested Prescriptions  Pending Prescriptions Disp Refills   traMADol  (ULTRAM ) 50 MG tablet [Pharmacy Med Name: TRAMADOL  HCL 50MG  TABLET] 90 tablet     Sig: TAKE 1 TABLET BY MOUTH EVERY 6 HOURS AS NEEDED     Not Delegated - Analgesics:  Opioid Agonists Failed - 07/12/2024  2:18 PM      Failed - This refill cannot be delegated      Failed - Urine Drug Screen completed in last 360 days      Passed - Valid encounter within last 3 months    Recent Outpatient Visits           1 month ago Skin nodule   [place] [facility] Kayla Jeoffrey RAMAN, FNP   9 months ago Primary hypertension   [place] [facility] Duanne, Butler DASEN, MD   1 year ago Primary hypertension   [place] [facility] Kayla Jeoffrey RAMAN, FNP   1 year ago Colon cancer screening   [place] [facility] Kayla Jeoffrey RAMAN, FNP   2 years ago Prostate cancer screening   [place] [facility] Pickard, Butler DASEN, MD

## 2024-07-13 ENCOUNTER — Telehealth: Payer: Self-pay

## 2024-07-13 ENCOUNTER — Other Ambulatory Visit: Payer: Self-pay | Admitting: Family Medicine

## 2024-07-13 NOTE — Telephone Encounter (Signed)
 Lvm for my call to be returned.

## 2024-07-13 NOTE — Telephone Encounter (Signed)
 Copied from CRM #[id number]. Topic: Clinical - Medical Advice >> Jul 13, 2024  9:17 AM Antwanette L wrote: Reason for CRM: Patient wife(Debbie) wants to know if its time for the patient to have a colonscopy. The patient was referred to Dr. Victory Brand at [facility]( [address] Curtis Lucas is requesting a call back at [phone]

## 2024-07-13 NOTE — Telephone Encounter (Signed)
 Pt informed to give [facility] a call.

## 2024-07-13 NOTE — Telephone Encounter (Unsigned)
 Copied from CRM [id number]. Topic: Clinical - Medication Refill >> Jul 13, 2024  9:09 AM Curtis Lucas wrote: Medication: traMADol  (ULTRAM ) 50 MG tablet   Has the patient contacted their pharmacy? Yes   This is the patient's preferred pharmacy:  [facility] [id number] [place], [place] - [address] [place] [address] Phone: [phone] Fax: [phone]  Is this the correct pharmacy for this prescription? Yes   Has the prescription been filled recently? No.Last refill was on 06/09/24  Is the patient out of the medication? Yes  Has the patient been seen for an appointment in the last year OR does the patient have an upcoming appointment? Yes. Last ov was on 5/58/25 with Jeoffrey Barrio FNP  Can we respond through MyChart? No.Contact the patient by phone at [phone] or his wife Curtis Lucas at [phone]  Agent: Please be advised that Rx refills may take up to 3 business days. We ask that you follow-up with your pharmacy.

## 2024-07-14 ENCOUNTER — Other Ambulatory Visit: Payer: Self-pay | Admitting: Family Medicine

## 2024-07-14 ENCOUNTER — Telehealth: Payer: Self-pay | Admitting: Family Medicine

## 2024-07-14 NOTE — Telephone Encounter (Signed)
 Request has been sent to Dr Duanne.

## 2024-07-14 NOTE — Telephone Encounter (Unsigned)
 Copied from CRM [id number]. Topic: Clinical - Medication Question >> Jul 14, 2024 10:55 AM Donee H wrote: Reason for CRM: Patient's wife Marval called to check status of medication refill request for traMADol  (ULTRAM ) 50 MG tablet. Patient is out of medication and pharmacy stated to patient refill was denied by Dr. Duanne. She states he need medication and has been taking for years with no issues. Please follow up with either wife Marval at [phone] or patient at [phone]

## 2024-07-14 NOTE — Telephone Encounter (Signed)
 Requested medication (s) are due for refill today: yes  Requested medication (s) are on the active medication list: yes  Last refill:  06/09/24  Future visit scheduled: yes  Notes to clinic:  Unable to refill per protocol, cannot delegate.      Requested Prescriptions  Pending Prescriptions Disp Refills   traMADol  (ULTRAM ) 50 MG tablet 90 tablet 0    Sig: Take 1 tablet (50 mg total) by mouth every 6 (six) hours as needed.     Not Delegated - Analgesics:  Opioid Agonists Failed - 07/14/2024  1:52 PM      Failed - This refill cannot be delegated      Failed - Urine Drug Screen completed in last 360 days      Passed - Valid encounter within last 3 months    Recent Outpatient Visits           1 month ago Skin nodule   [place] [facility] Kayla Jeoffrey RAMAN, FNP   9 months ago Primary hypertension   [place] [facility] Duanne, Butler DASEN, MD   1 year ago Primary hypertension   [place] [facility] Kayla Jeoffrey RAMAN, FNP   1 year ago Colon cancer screening   [place] [facility] Kayla Jeoffrey RAMAN, FNP   2 years ago Prostate cancer screening   [place] [facility] Pickard, Butler DASEN, MD

## 2024-08-04 ENCOUNTER — Other Ambulatory Visit: Payer: Self-pay | Admitting: Family Medicine

## 2024-08-04 DIAGNOSIS — F411 Generalized anxiety disorder: Secondary | ICD-10-CM

## 2024-08-07 NOTE — Telephone Encounter (Signed)
 Requested medications are due for refill today.  yes  Requested medications are on the active medications list.  yes  Last refill. 05/08/2024 #90 0 rf  Future visit scheduled.   Yes in January  Notes to clinic.  PT last seen by provider 09/21/2023. Pt did have an acute home visit with Jeoffrey Barrio. Please advise.    Requested Prescriptions  Pending Prescriptions Disp Refills   escitalopram  (LEXAPRO ) 20 MG tablet [Pharmacy Med Name: ESCITALOPRAM  20 MG TABLET] 90 tablet 0    Sig: TAKE 1 TABLET BY MOUTH DAILY     Psychiatry:  Antidepressants - SSRI Passed - 08/07/2024  4:21 PM      Passed - Valid encounter within last 6 months    Recent Outpatient Visits           2 months ago Skin nodule   [place] [facility] Barrio Jeoffrey RAMAN, FNP   10 months ago Primary hypertension   [place] [facility] Duanne, Butler DASEN, MD   1 year ago Primary hypertension   [place] [facility] Barrio Jeoffrey RAMAN, FNP   1 year ago Colon cancer screening   [place] [facility] Barrio Jeoffrey RAMAN, FNP   2 years ago Prostate cancer screening   [place] [facility] Pickard, Butler DASEN, MD

## 2024-08-09 ENCOUNTER — Other Ambulatory Visit: Payer: Self-pay

## 2024-08-09 ENCOUNTER — Telehealth: Payer: Self-pay | Admitting: Family Medicine

## 2024-08-09 DIAGNOSIS — F411 Generalized anxiety disorder: Secondary | ICD-10-CM

## 2024-08-09 MED ORDER — ESCITALOPRAM OXALATE 20 MG PO TABS
20.0000 mg | ORAL_TABLET | Freq: Every day | ORAL | 1 refills | Status: DC
Start: 2024-08-09 — End: 2024-10-11

## 2024-08-09 NOTE — Telephone Encounter (Signed)
 Prescription Request  08/09/2024  LOV: 09/21/2023  What is the name of the medication or equipment?   escitalopram  (LEXAPRO ) 20 MG tablet [[id number]]   Have you contacted your pharmacy to request a refill? Yes   Which pharmacy would you like this sent to?  [name] [id number] [place], [place] - [address] [place] [address] Phone: [phone] Fax: [phone]    Patient notified that their request is being sent to the clinical staff for review and that they should receive a response within 2 business days.   Please advise pharmacist.

## 2024-08-19 ENCOUNTER — Other Ambulatory Visit: Payer: Self-pay | Admitting: Family Medicine

## 2024-08-27 ENCOUNTER — Other Ambulatory Visit: Payer: Self-pay | Admitting: Family Medicine

## 2024-08-28 ENCOUNTER — Other Ambulatory Visit: Payer: Self-pay | Admitting: Family Medicine

## 2024-08-28 MED ORDER — TRAMADOL HCL 50 MG PO TABS: 50.0000 mg | ORAL_TABLET | Freq: Four times a day (QID) | ORAL | 0 refills | Status: DC | PRN

## 2024-09-04 ENCOUNTER — Telehealth: Payer: Self-pay | Admitting: Family Medicine

## 2024-09-04 NOTE — Telephone Encounter (Signed)
 Prescription Request  09/04/2024  LOV: 09/21/2023  What is the name of the medication or equipment? Tramadol  hcl 50 mg tablet   Have you contacted your pharmacy to request a refill? Yes   Which pharmacy would you like this sent to?  [name] [id number] [place], [place] - [address] [place] [address] Phone: [phone] Fax: [phone]    Patient notified that their request is being sent to the clinical staff for review and that they should receive a response within 2 business days.   Please advise at [facility] [phone]

## 2024-09-06 ENCOUNTER — Other Ambulatory Visit: Payer: Self-pay | Admitting: Family Medicine

## 2024-09-07 NOTE — Telephone Encounter (Signed)
 Requested medications are due for refill today.  No   Requested medications are on the active medications list.  yes  Last refill. 08/28/2024 #90 0 rf  Future visit scheduled.   yes  Notes to clinic.  Not delegated.    Requested Prescriptions  Pending Prescriptions Disp Refills   traMADol  (ULTRAM ) 50 MG tablet [Pharmacy Med Name: TRAMADOL  HCL 50 MG TABLET] 90 tablet     Sig: TAKE 1 TABLET BY MOUTH EVERY 6 HOURS AS NEEDED     Not Delegated - Analgesics:  Opioid Agonists Failed - 09/07/2024  3:34 PM      Failed - This refill cannot be delegated      Failed - Urine Drug Screen completed in last 360 days      Failed - Valid encounter within last 3 months    Recent Outpatient Visits           3 months ago Skin nodule   [place] [facility] Kayla Jeoffrey RAMAN, FNP   11 months ago Primary hypertension   [place] [facility] Duanne, Butler DASEN, MD   1 year ago Primary hypertension   [place] [facility] Kayla Jeoffrey RAMAN, FNP   1 year ago Colon cancer screening   [place] [facility] Kayla Jeoffrey RAMAN, FNP   2 years ago Prostate cancer screening   [place] [facility] Pickard, Butler DASEN, MD

## 2024-09-18 ENCOUNTER — Other Ambulatory Visit: Payer: Self-pay | Admitting: Family Medicine

## 2024-10-04 ENCOUNTER — Other Ambulatory Visit: Payer: Self-pay | Admitting: Family Medicine

## 2024-10-11 ENCOUNTER — Other Ambulatory Visit: Payer: Self-pay | Admitting: Family Medicine

## 2024-10-11 DIAGNOSIS — F411 Generalized anxiety disorder: Secondary | ICD-10-CM

## 2024-10-13 ENCOUNTER — Other Ambulatory Visit: Payer: Self-pay | Admitting: Family Medicine

## 2024-10-25 ENCOUNTER — Other Ambulatory Visit: Payer: Self-pay | Admitting: Family Medicine

## 2024-11-22 ENCOUNTER — Other Ambulatory Visit: Payer: Self-pay

## 2024-11-22 NOTE — Telephone Encounter (Signed)
 Prescription Request  11/22/2024  LOV: 05/17/24  What is the name of the medication or equipment? amLODipine  (NORVASC ) 10 MG tablet [[id number]]   Have you contacted your pharmacy to request a refill? Yes   Which pharmacy would you like this sent to?  [name] [id number] [place], [place] - [address] [place] [address] Phone: [phone] Fax: [phone]    Patient notified that their request is being sent to the clinical staff for review and that they should receive a response within 2 business days.   Please advise at [facility] [phone]

## 2024-11-25 MED ORDER — AMLODIPINE BESYLATE 10 MG PO TABS: 10.0000 mg | ORAL_TABLET | Freq: Every day | ORAL | 2 refills | Status: AC

## 2024-11-26 ENCOUNTER — Other Ambulatory Visit: Payer: Self-pay | Admitting: Family Medicine

## 2024-12-10 ENCOUNTER — Other Ambulatory Visit: Payer: Self-pay | Admitting: Family Medicine

## 2024-12-10 DIAGNOSIS — F411 Generalized anxiety disorder: Secondary | ICD-10-CM

## 2024-12-19 ENCOUNTER — Other Ambulatory Visit: Payer: Self-pay | Admitting: Family Medicine

## 2024-12-26 ENCOUNTER — Other Ambulatory Visit: Payer: Self-pay | Admitting: Family Medicine

## 2024-12-29 ENCOUNTER — Ambulatory Visit: Admitting: Gastroenterology

## 2025-01-02 ENCOUNTER — Encounter: Payer: Self-pay | Admitting: Gastroenterology

## 2025-01-09 ENCOUNTER — Other Ambulatory Visit: Payer: Self-pay | Admitting: Family Medicine

## 2025-01-09 NOTE — Telephone Encounter (Signed)
 Requested Prescriptions  Pending Prescriptions Disp Refills   pantoprazole  (PROTONIX ) 40 MG tablet [Pharmacy Med Name: PANTOPRAZOLE  SOD DR 40 MG TAB] 90 tablet 3    Sig: TAKE 1 TABLET BY MOUTH DAILY     Gastroenterology: Proton Pump Inhibitors Passed - 01/09/2025  2:44 PM      Passed - Valid encounter within last 12 months    Recent Outpatient Visits           7 months ago Skin nodule   [place] [facility] Kayla Jeoffrey RAMAN, FNP   1 year ago Primary hypertension   [place] [facility] Duanne, Butler DASEN, MD   1 year ago Primary hypertension   [place] [facility] Kayla Jeoffrey RAMAN, FNP   2 years ago Colon cancer screening   [place] [facility] Kayla Jeoffrey RAMAN, FNP   2 years ago Prostate cancer screening    [facility] Pickard, Butler DASEN, MD

## 2025-01-18 ENCOUNTER — Ambulatory Visit: Payer: Medicare HMO

## 2025-01-18 ENCOUNTER — Ambulatory Visit

## 2025-01-18 VITALS — Ht 70.0 in | Wt 200.4 lb

## 2025-01-18 VITALS — Ht 70.0 in | Wt 201.0 lb

## 2025-01-18 DIAGNOSIS — Z860100 Personal history of colon polyps, unspecified: Secondary | ICD-10-CM

## 2025-01-18 DIAGNOSIS — Z0000 Encounter for general adult medical examination without abnormal findings: Secondary | ICD-10-CM | POA: Diagnosis not present

## 2025-01-18 MED ORDER — NA SULFATE-K SULFATE-MG SULF 17.5-3.13-1.6 GM/177ML PO SOLN: 1.0000 | Freq: Once | ORAL | 0 refills | Status: AC

## 2025-01-18 NOTE — Progress Notes (Signed)
 "  Chief Complaint  Patient presents with   Medicare Wellness     Subjective:   Curtis Lucas is a 64 y.o. male who presents for a Medicare Annual Wellness Visit.  Visit info / Clinical Intake: Medicare Wellness Visit Type:: Subsequent Annual Wellness Visit Persons participating in visit and providing information:: patient Medicare Wellness Visit Mode:: Telephone If telephone:: video declined Since this visit was completed virtually, some vitals may be partially provided or unavailable. Missing vitals are due to the limitations of the virtual format.: Documented vitals are patient reported If Telephone or Video please confirm:: I connected with patient using audio/video enable telemedicine. I verified patient identity with two identifiers, discussed telehealth limitations, and patient agreed to proceed. Patient Location:: home Provider Location:: home office Interpreter Needed?: No Pre-visit prep was completed: yes AWV questionnaire completed by patient prior to visit?: no Living arrangements:: lives with spouse/significant other Patient's Overall Health Status Rating: good Typical amount of pain: some Does pain affect daily life?: no Are you currently prescribed opioids?: no  Dietary Habits and Nutritional Risks How many meals a day?: 3 Eats fruit and vegetables daily?: yes Most meals are obtained by: having others provide food; preparing own meals In the last 2 weeks, have you had any of the following?: none Diabetic:: no  Functional Status Activities of Daily Living (to include ambulation/medication): Independent Ambulation: Independent Medication Administration: Independent Home Management (perform basic housework or laundry): Independent Manage your own finances?: yes Primary transportation is: driving Concerns about vision?: no *vision screening is required for WTM* Concerns about hearing?: no  Fall Screening Falls in the past year?: 0 Number of falls in past  year: 0 Was there an injury with Fall?: 0 Fall Risk Category Calculator: 0 Patient Fall Risk Level: Low Fall Risk  Fall Risk Patient at Risk for Falls Due to: No Fall Risks Fall risk Follow up: Falls evaluation completed; Education provided; Falls prevention discussed  Home and Transportation Safety: All rugs have non-skid backing?: yes All stairs or steps have railings?: yes Grab bars in the bathtub or shower?: yes Have non-skid surface in bathtub or shower?: yes Good home lighting?: yes Regular seat belt use?: yes Hospital stays in the last year:: no  Cognitive Assessment Difficulty concentrating, remembering, or making decisions? : no Will 6CIT or Mini Cog be Completed: no 6CIT or Mini Cog Declined: patient alert, oriented, able to answer questions appropriately and recall recent events  Advance Directives (For Healthcare) Does Patient Have a Medical Advance Directive?: No Would patient like information on creating a medical advance directive?: Yes (MAU/Ambulatory/Procedural Areas - Information given)  Reviewed/Updated  Reviewed/Updated: Reviewed All (Medical, Surgical, Family, Medications, Allergies, Care Teams, Patient Goals)    Allergies (verified) Lipitor [atorvastatin calcium ]   Current Medications (verified) Outpatient Encounter Medications as of 01/18/2025  Medication Sig   albuterol  (VENTOLIN  HFA) 108 (90 Base) MCG/ACT inhaler Inhale 2 puffs into the lungs every 6 (six) hours as needed for wheezing or shortness of breath.   amLODipine  (NORVASC ) 10 MG tablet Take 1 tablet (10 mg total) by mouth daily.   aspirin  81 MG tablet Take 81 mg by mouth daily.   atenolol  (TENORMIN ) 100 MG tablet TAKE 1 TABLET BY MOUTH DAILY   escitalopram  (LEXAPRO ) 20 MG tablet TAKE 1 TABLET BY MOUTH DAILY   Na Sulfate-K Sulfate-Mg Sulfate concentrate (SUPREP) 17.5-3.13-1.6 GM/177ML SOLN Take 1 kit (354 mLs total) by mouth once for 1 dose.   Omega-3 Fatty Acids (FISH OIL) 1200 MG CAPS Take  1 capsule by mouth at bedtime.    pantoprazole  (PROTONIX ) 40 MG tablet TAKE 1 TABLET BY MOUTH DAILY   rosuvastatin  (CRESTOR ) 20 MG tablet TAKE 1 TABLET BY MOUTH DAILY   traMADol  (ULTRAM ) 50 MG tablet TAKE 1 TABLET BY MOUTH EVERY 6 HOURS AS NEEDED   zolpidem  (AMBIEN ) 10 MG tablet TAKE 1 TABLET BY MOUTH EVERY NIGHT AT BEDTIME AS NEEDED FOR SLEEP   FLUZONE QUADRIVALENT 0.5 ML injection    neomycin -polymyxin-hydrocortisone (CORTISPORIN) OTIC solution 3 drops each ear for 5 days (Patient not taking: Reported on 01/13/2024)   SPIKEVAX injection    tiZANidine  (ZANAFLEX ) 4 MG tablet Take 1 tablet (4 mg total) by mouth every 6 (six) hours as needed for muscle spasms. (Patient not taking: Reported on 01/18/2025)   No facility-administered encounter medications on file as of 01/18/2025.    History: Past Medical History:  Diagnosis Date   Allergy    Rhinitis   Anxiety    Arthritis    Biceps rupture, distal, right, initial encounter    CVA (cerebral vascular accident) (HCC)    Depression    Difficulty sleeping    Elevated lipids    GERD (gastroesophageal reflux disease)    Hyperglycemia    Hyperlipidemia    Hypertension    Insomnia    Past Surgical History:  Procedure Laterality Date   COLONOSCOPY  09/24/2003   patterson--normal per pt.   DISTAL BICEPS TENDON REPAIR Right 02/24/2017   Procedure: RIGHT DISTAL BICEPS TENDON REPAIR;  Surgeon: Kay CHRISTELLA Cummins, MD;  Location: [facility];  Service: Orthopedics;  Laterality: Right;   HEAD INJURY   1987   JOINT REPLACEMENT     LUMBAR EPIDURAL INJECTION     RECTAL SURGERY  2006   TOTAL HIP ARTHROPLASTY     TOTAL HIP ARTHROPLASTY  05/03/2012   Procedure: TOTAL HIP ARTHROPLASTY ANTERIOR APPROACH;  Surgeon: Donnice JONETTA Car, MD;  Location: [facility];  Service: Orthopedics;  Laterality: Right;   Family History  Problem Relation Age of Onset   Stroke Mother    Cancer Mother        colon   Dementia Mother    Colon cancer Mother     Parkinson's disease Father    Heart attack Paternal Grandfather    Dementia Paternal Grandmother    Colon polyps Neg Hx    Esophageal cancer Neg Hx    Rectal cancer Neg Hx    Stomach cancer Neg Hx    Social History   Occupational History    Employer: [employer]    Comment: Stage Manager  Tobacco Use   Smoking status: Never   Smokeless tobacco: Never  Substance and Sexual Activity   Alcohol use: Yes    Comment: 4-5 BEERS PER WK   Drug use: Yes    Types: Marijuana   Sexual activity: Yes   Tobacco Counseling Counseling given: Not Answered  SDOH Screenings   Food Insecurity: No Food Insecurity (01/18/2025)  Housing: Low Risk (01/18/2025)  Transportation Needs: No Transportation Needs (01/18/2025)  Utilities: Not At Risk (01/18/2025)  Alcohol Screen: Low Risk (01/05/2023)  Depression (PHQ2-9): Low Risk (01/18/2025)  Financial Resource Strain: Low Risk (01/13/2024)  Physical Activity: Insufficiently Active (01/18/2025)  Social Connections: Moderately Isolated (01/18/2025)  Stress: No Stress Concern Present (01/18/2025)  Tobacco Use: Low Risk (01/18/2025)  Health Literacy: Adequate Health Literacy (01/18/2025)   See flowsheets for full screening details  Depression Screen PHQ 2 & 9 Depression Scale- Over the past  2 weeks, how often have you been bothered by any of the following problems? Little interest or pleasure in doing things: 1 Feeling down, depressed, or hopeless (PHQ Adolescent also includes...irritable): 0 PHQ-2 Total Score: 1 Trouble falling or staying asleep, or sleeping too much: 1 Feeling tired or having little energy: 1 Poor appetite or overeating (PHQ Adolescent also includes...weight loss): 0 Feeling bad about yourself - or that you are a failure or have let yourself or your family down: 0 Trouble concentrating on things, such as reading the newspaper or watching television (PHQ Adolescent also includes...like school work): 0 Moving or speaking so  slowly that other people could have noticed. Or the opposite - being so fidgety or restless that you have been moving around a lot more than usual: 0 Thoughts that you would be better off dead, or of hurting yourself in some way: 0 PHQ-9 Total Score: 3 If you checked off any problems, how difficult have these problems made it for you to do your work, take care of things at home, or get along with other people?: Very difficult  Depression Treatment Depression Interventions/Treatment : PHQ2-9 Score <4 Follow-up Not Indicated     Goals Addressed             This Visit's Progress    Maintain health and independence   On track    COMPLETED: Pharmacy Care Plan:       CARE PLAN ENTRY  Current Barriers:  Chronic Disease Management support, education, and care coordination needs related to Hypertension and Hyperlipidemia   Hypertension Pharmacist Clinical Goal(s): Over the next 180 days, patient will work with PharmD and providers to maintain BP goal <140/90 Current regimen:  Amlodipine  10mg  daily, atenolol  100mg  daily Interventions: Comprehensive medication review Continue current therapy Patient self care activities - Over the next 180 days, patient will: Check BP as needed, document, and provide at future appointments Ensure daily salt intake < 2300 mg/day Contact PharmD or PCP with blood pressure readings > 140/90  Hyperlipidemia Pharmacist Clinical Goal(s): Over the next 180 days, patient will work with PharmD and providers to maintain LDL goal < 100 and work to lower triglycerides. Current regimen:  Rosuvastatin  20mg  daily Interventions: Comprehensive medication review Continue current therapy Reviewed most recent lipid panel and goals of therapy Patient self care activities - Over the next 180 days, patient will: Continue to take medication as directed Limit servings of fried/fatty foods to 2 to 3 times per week to lower triglycerides Contact PharmD or PCP with any  medication related concerns. Work to limit the amount of alcohol consumption   Please see past updates related to this goal by clicking on the Past Updates button in the selected goal               Objective:    Today's Vitals   01/18/25 1400  Weight: 201 lb (91.2 kg)  Height: 5' 10 (1.778 m)   Body mass index is 28.84 kg/m.  Hearing/Vision screen No results found. Immunizations and Health Maintenance Health Maintenance  Topic Date Due   Pneumococcal Vaccine: 50+ Years (1 of 1 - PCV) Never done   Zoster Vaccines- Shingrix (1 of 2) Never done   Colonoscopy  06/28/2022   DTaP/Tdap/Td (3 - Td or Tdap) 04/20/2024   Influenza Vaccine  03/20/2025 (Originally 07/21/2024)   Medicare Annual Wellness (AWV)  01/18/2026   Hepatitis C Screening  Completed   HIV Screening  Completed   Hepatitis B Vaccines 19-59 Average Risk  Aged Out   Meningococcal B Vaccine  Aged Out   COVID-19 Vaccine  Discontinued        Assessment/Plan:  This is a routine wellness examination for Curtis Lucas.  Patient Care Team: Duanne Butler DASEN, MD as PCP - General (Family Medicine) Legrand, Victory LITTIE MOULD, MD as Consulting Physician (Gastroenterology)  I have personally reviewed and noted the following in the patients chart:   Medical and social history Use of alcohol, tobacco or illicit drugs  Current medications and supplements including opioid prescriptions. Functional ability and status Nutritional status Physical activity Advanced directives List of other physicians Hospitalizations, surgeries, and ER visits in previous 12 months Vitals Screenings to include cognitive, depression, and falls Referrals and appointments  No orders of the defined types were placed in this encounter.  In addition, I have reviewed and discussed with patient certain preventive protocols, quality metrics, and best practice recommendations. A written personalized care plan for preventive services as well as general  preventive health recommendations were provided to patient.   Lavelle Charmaine Browner, LPN   8/70/7973   Return in 1 year (on 01/18/2026).  After Visit Summary: (MyChart) Due to this being a telephonic visit, the after visit summary with patients personalized plan was offered to patient via MyChart   Nurse Notes: No voiced or noted concerns at this time HM Addressed: patient declines all vaccines; recommended that he schedule a routine office visit with pcp and he stated that he would call back to schedule   "

## 2025-01-18 NOTE — Patient Instructions (Addendum)
 Mr. Cronkright,  Thank you for taking the time for your Medicare Wellness Visit. I appreciate your continued commitment to your health goals. Please review the care plan we discussed, and feel free to reach out if I can assist you further.  Please note that Annual Wellness Visits do not include a physical exam. Some assessments may be limited, especially if the visit was conducted virtually. If needed, we may recommend an in-person follow-up with your provider.  Ongoing Care Seeing your primary care provider every 3 to 6 months helps us  monitor your health and provide consistent, personalized care. (Call back to schedule your next office visit)  Referrals If a referral was made during today's visit and you haven't received any updates within two weeks, please contact the referred provider directly to check on the status.  Recommended Screenings:  Health Maintenance  Topic Date Due   Pneumococcal Vaccine for age over 12 (1 of 1 - PCV) Never done   Zoster (Shingles) Vaccine (1 of 2) Never done   Colon Cancer Screening  06/28/2022   DTaP/Tdap/Td vaccine (3 - Td or Tdap) 04/20/2024   Flu Shot  03/20/2025*   Medicare Annual Wellness Visit  01/18/2026   Hepatitis C Screening  Completed   HIV Screening  Completed   Hepatitis B Vaccine  Aged Out   Meningitis B Vaccine  Aged Out   COVID-19 Vaccine  Discontinued  *Topic was postponed. The date shown is not the original due date.       01/18/2025    2:24 PM  Advanced Directives  Does Patient Have a Medical Advance Directive? No  Would patient like information on creating a medical advance directive? Yes (MAU/Ambulatory/Procedural Areas - Information given)   Information on Advanced Care Planning can be found at [place]  Secretary of [facility] Advance Health Care Directives Advance Health Care Directives ([email or web address]   Vision: Annual vision screenings are recommended for early detection of glaucoma, cataracts, and diabetic retinopathy. These  exams can also reveal signs of chronic conditions such as diabetes and high blood pressure.  Dental: Annual dental screenings help detect early signs of oral cancer, gum disease, and other conditions linked to overall health, including heart disease and diabetes.  Please see the attached documents for additional preventive care recommendations.

## 2025-01-18 NOTE — Progress Notes (Signed)
 PCP MD at time of PV: Pickard, MD  __________________________________________________________________________________________________________________________________________  No egg allergy known to patient  No soy allergy known to patient No issues known to pt with past sedation with any surgeries or procedures Patient denies ever being told they had issues or difficulty with intubation  No FH of Malignant Hyperthermia Pt is not on diet pills Pt is not on  home 02  Pt is not on blood thinners  No A fib or A flutter Have any cardiac testing pending-- no  LOA: independent  No Chew or Snuff tobacco __________________________________________________________________________________________________________________________________________  Constipation: no  Prep: suprep  __________________________________________________________________________________________________________________________________________  PV completed with patient. Prep instructions reviewed and provided during apt. Rx sent to preferred pharmacy.  __________________________________________________________________________________________________________________________________________  Patient's chart reviewed by Norleen Schillings CNRA prior to previsit and patient appropriate for the [facility].  Previsit completed and red dot placed by patient's name on their procedure day (on provider's schedule).

## 2025-01-22 ENCOUNTER — Encounter: Payer: Self-pay | Admitting: Gastroenterology

## 2025-01-26 ENCOUNTER — Other Ambulatory Visit: Payer: Self-pay | Admitting: Family Medicine

## 2025-02-01 ENCOUNTER — Encounter: Admitting: Gastroenterology
